# Patient Record
Sex: Female | Born: 1950
Health system: Southern US, Community
[De-identification: ages and names within clinical notes are randomized; demographics above are authoritative.]

## PROBLEM LIST (undated history)

## (undated) DIAGNOSIS — F32A Depression, unspecified: Secondary | ICD-10-CM

## (undated) DIAGNOSIS — R7303 Prediabetes: Secondary | ICD-10-CM

## (undated) DIAGNOSIS — R011 Cardiac murmur, unspecified: Secondary | ICD-10-CM

## (undated) DIAGNOSIS — N2889 Other specified disorders of kidney and ureter: Secondary | ICD-10-CM

## (undated) DIAGNOSIS — M48061 Spinal stenosis, lumbar region without neurogenic claudication: Secondary | ICD-10-CM

## (undated) DIAGNOSIS — Z8489 Family history of other specified conditions: Secondary | ICD-10-CM

## (undated) DIAGNOSIS — Z9989 Dependence on other enabling machines and devices: Secondary | ICD-10-CM

## (undated) DIAGNOSIS — C801 Malignant (primary) neoplasm, unspecified: Secondary | ICD-10-CM

## (undated) DIAGNOSIS — M1990 Unspecified osteoarthritis, unspecified site: Secondary | ICD-10-CM

## (undated) DIAGNOSIS — G4733 Obstructive sleep apnea (adult) (pediatric): Secondary | ICD-10-CM

## (undated) DIAGNOSIS — I10 Essential (primary) hypertension: Secondary | ICD-10-CM

## (undated) DIAGNOSIS — G4730 Sleep apnea, unspecified: Secondary | ICD-10-CM

## (undated) DIAGNOSIS — H269 Unspecified cataract: Secondary | ICD-10-CM

## (undated) DIAGNOSIS — G514 Facial myokymia: Secondary | ICD-10-CM

## (undated) DIAGNOSIS — E785 Hyperlipidemia, unspecified: Secondary | ICD-10-CM

## (undated) DIAGNOSIS — E039 Hypothyroidism, unspecified: Secondary | ICD-10-CM

## (undated) DIAGNOSIS — F329 Major depressive disorder, single episode, unspecified: Secondary | ICD-10-CM

## (undated) DIAGNOSIS — D649 Anemia, unspecified: Secondary | ICD-10-CM

## (undated) HISTORY — DX: Obstructive sleep apnea (adult) (pediatric): G47.33

## (undated) HISTORY — DX: Major depressive disorder, single episode, unspecified: F32.9

## (undated) HISTORY — PX: GASTRIC BYPASS: SHX52

## (undated) HISTORY — PX: TUBAL LIGATION: SHX77

## (undated) HISTORY — DX: Dependence on other enabling machines and devices: Z99.89

## (undated) HISTORY — DX: Sleep apnea, unspecified: G47.30

## (undated) HISTORY — DX: Hyperlipidemia, unspecified: E78.5

## (undated) HISTORY — PX: TONSILLECTOMY: SUR1361

## (undated) HISTORY — PX: HERNIA REPAIR: SHX51

## (undated) HISTORY — PX: UVULECTOMY: SHX2631

## (undated) HISTORY — DX: Cardiac murmur, unspecified: R01.1

## (undated) HISTORY — PX: JOINT REPLACEMENT: SHX530

## (undated) HISTORY — DX: Depression, unspecified: F32.A

## (undated) HISTORY — DX: Unspecified cataract: H26.9

## (undated) HISTORY — DX: Spinal stenosis, lumbar region without neurogenic claudication: M48.061

## (undated) HISTORY — DX: Hypothyroidism, unspecified: E03.9

## (undated) HISTORY — PX: KNEE ARTHROSCOPY: SUR90

## (undated) HISTORY — DX: Essential (primary) hypertension: I10

## (undated) HISTORY — PX: SPINE SURGERY: SHX786

## (undated) HISTORY — DX: Unspecified osteoarthritis, unspecified site: M19.90

---

## 2000-12-17 ENCOUNTER — Encounter: Admission: RE | Admit: 2000-12-17 | Discharge: 2000-12-17 | Payer: Self-pay | Admitting: Family Medicine

## 2000-12-17 ENCOUNTER — Encounter: Payer: Self-pay | Admitting: Family Medicine

## 2000-12-25 ENCOUNTER — Encounter: Admission: RE | Admit: 2000-12-25 | Discharge: 2000-12-25 | Payer: Self-pay | Admitting: Family Medicine

## 2000-12-25 ENCOUNTER — Encounter: Payer: Self-pay | Admitting: Family Medicine

## 2001-01-07 ENCOUNTER — Encounter: Payer: Self-pay | Admitting: Family Medicine

## 2001-01-07 ENCOUNTER — Encounter: Admission: RE | Admit: 2001-01-07 | Discharge: 2001-01-07 | Payer: Self-pay | Admitting: Family Medicine

## 2001-12-16 ENCOUNTER — Other Ambulatory Visit: Admission: RE | Admit: 2001-12-16 | Discharge: 2001-12-16 | Payer: Self-pay | Admitting: Family Medicine

## 2001-12-30 ENCOUNTER — Encounter: Payer: Self-pay | Admitting: Family Medicine

## 2001-12-30 ENCOUNTER — Ambulatory Visit (HOSPITAL_COMMUNITY): Admission: RE | Admit: 2001-12-30 | Discharge: 2001-12-30 | Payer: Self-pay | Admitting: Family Medicine

## 2002-02-18 ENCOUNTER — Ambulatory Visit (HOSPITAL_COMMUNITY): Admission: RE | Admit: 2002-02-18 | Discharge: 2002-02-18 | Payer: Self-pay | Admitting: Gastroenterology

## 2002-04-03 ENCOUNTER — Encounter: Payer: Self-pay | Admitting: Family Medicine

## 2002-04-03 ENCOUNTER — Encounter: Admission: RE | Admit: 2002-04-03 | Discharge: 2002-04-03 | Payer: Self-pay | Admitting: Family Medicine

## 2002-05-03 ENCOUNTER — Encounter: Payer: Self-pay | Admitting: Neurosurgery

## 2002-05-03 ENCOUNTER — Encounter: Admission: RE | Admit: 2002-05-03 | Discharge: 2002-05-03 | Payer: Self-pay | Admitting: Neurosurgery

## 2002-05-31 ENCOUNTER — Encounter: Payer: Self-pay | Admitting: Neurosurgery

## 2002-06-03 ENCOUNTER — Encounter: Payer: Self-pay | Admitting: Neurosurgery

## 2002-06-03 ENCOUNTER — Inpatient Hospital Stay (HOSPITAL_COMMUNITY): Admission: RE | Admit: 2002-06-03 | Discharge: 2002-06-04 | Payer: Self-pay | Admitting: Neurosurgery

## 2002-07-15 ENCOUNTER — Encounter: Payer: Self-pay | Admitting: Neurosurgery

## 2002-07-15 ENCOUNTER — Encounter: Admission: RE | Admit: 2002-07-15 | Discharge: 2002-07-15 | Payer: Self-pay | Admitting: Neurosurgery

## 2002-08-26 ENCOUNTER — Encounter: Payer: Self-pay | Admitting: Neurosurgery

## 2002-08-26 ENCOUNTER — Encounter: Admission: RE | Admit: 2002-08-26 | Discharge: 2002-08-26 | Payer: Self-pay | Admitting: Neurosurgery

## 2002-12-29 ENCOUNTER — Other Ambulatory Visit: Admission: RE | Admit: 2002-12-29 | Discharge: 2002-12-29 | Payer: Self-pay | Admitting: Family Medicine

## 2003-02-17 ENCOUNTER — Encounter: Payer: Self-pay | Admitting: Neurosurgery

## 2003-02-17 ENCOUNTER — Encounter: Admission: RE | Admit: 2003-02-17 | Discharge: 2003-02-17 | Payer: Self-pay | Admitting: Neurosurgery

## 2004-02-24 ENCOUNTER — Other Ambulatory Visit: Admission: RE | Admit: 2004-02-24 | Discharge: 2004-02-24 | Payer: Self-pay | Admitting: *Deleted

## 2004-03-18 ENCOUNTER — Ambulatory Visit (HOSPITAL_BASED_OUTPATIENT_CLINIC_OR_DEPARTMENT_OTHER): Admission: RE | Admit: 2004-03-18 | Discharge: 2004-03-18 | Payer: Self-pay | Admitting: Family Medicine

## 2004-05-16 ENCOUNTER — Ambulatory Visit (HOSPITAL_BASED_OUTPATIENT_CLINIC_OR_DEPARTMENT_OTHER): Admission: RE | Admit: 2004-05-16 | Discharge: 2004-05-16 | Payer: Self-pay | Admitting: Family Medicine

## 2004-07-02 ENCOUNTER — Ambulatory Visit (HOSPITAL_COMMUNITY): Admission: RE | Admit: 2004-07-02 | Discharge: 2004-07-02 | Payer: Self-pay | Admitting: Family Medicine

## 2005-05-06 ENCOUNTER — Other Ambulatory Visit: Admission: RE | Admit: 2005-05-06 | Discharge: 2005-05-06 | Payer: Self-pay | Admitting: Family Medicine

## 2006-01-16 ENCOUNTER — Encounter: Admission: RE | Admit: 2006-01-16 | Discharge: 2006-04-16 | Payer: Self-pay | Admitting: *Deleted

## 2006-01-23 ENCOUNTER — Ambulatory Visit (HOSPITAL_COMMUNITY): Admission: RE | Admit: 2006-01-23 | Discharge: 2006-01-23 | Payer: Self-pay | Admitting: Surgery

## 2006-01-24 ENCOUNTER — Ambulatory Visit (HOSPITAL_COMMUNITY): Admission: RE | Admit: 2006-01-24 | Discharge: 2006-01-24 | Payer: Self-pay | Admitting: *Deleted

## 2006-03-24 ENCOUNTER — Ambulatory Visit (HOSPITAL_COMMUNITY): Admission: RE | Admit: 2006-03-24 | Discharge: 2006-03-25 | Payer: Self-pay | Admitting: *Deleted

## 2006-03-24 ENCOUNTER — Encounter (INDEPENDENT_AMBULATORY_CARE_PROVIDER_SITE_OTHER): Payer: Self-pay | Admitting: Specialist

## 2006-05-05 ENCOUNTER — Encounter: Admission: RE | Admit: 2006-05-05 | Discharge: 2006-08-03 | Payer: Self-pay | Admitting: *Deleted

## 2006-11-20 ENCOUNTER — Encounter: Admission: RE | Admit: 2006-11-20 | Discharge: 2006-11-20 | Payer: Self-pay | Admitting: Family Medicine

## 2008-12-19 ENCOUNTER — Encounter: Admission: RE | Admit: 2008-12-19 | Discharge: 2008-12-19 | Payer: Self-pay | Admitting: Family Medicine

## 2010-12-09 ENCOUNTER — Encounter: Payer: Self-pay | Admitting: Family Medicine

## 2010-12-17 DIAGNOSIS — M159 Polyosteoarthritis, unspecified: Secondary | ICD-10-CM | POA: Insufficient documentation

## 2010-12-17 DIAGNOSIS — E669 Obesity, unspecified: Secondary | ICD-10-CM | POA: Insufficient documentation

## 2010-12-20 ENCOUNTER — Other Ambulatory Visit: Payer: Self-pay | Admitting: Family Medicine

## 2010-12-20 DIAGNOSIS — Z1239 Encounter for other screening for malignant neoplasm of breast: Secondary | ICD-10-CM

## 2010-12-20 DIAGNOSIS — Z780 Asymptomatic menopausal state: Secondary | ICD-10-CM

## 2010-12-20 DIAGNOSIS — J309 Allergic rhinitis, unspecified: Secondary | ICD-10-CM | POA: Insufficient documentation

## 2010-12-20 DIAGNOSIS — G2581 Restless legs syndrome: Secondary | ICD-10-CM | POA: Insufficient documentation

## 2011-01-15 ENCOUNTER — Ambulatory Visit
Admission: RE | Admit: 2011-01-15 | Discharge: 2011-01-15 | Disposition: A | Discharge status: Home / Self Care | Payer: BC Managed Care – PPO | Source: Ambulatory Visit | Attending: Family Medicine | Admitting: Family Medicine

## 2011-01-15 DIAGNOSIS — Z780 Asymptomatic menopausal state: Secondary | ICD-10-CM

## 2011-01-15 DIAGNOSIS — Z1239 Encounter for other screening for malignant neoplasm of breast: Secondary | ICD-10-CM

## 2011-04-05 NOTE — Procedures (Signed)
[place]  Patient:    Veronica Finley, Veronica Finley Visit Number: [id number] MRN: [id number]          Service Type: Attending:  Anselmo Rod, M.D. Dictated by:   Anselmo Rod, M.D. Proc. Date: 02/18/02   CC:         Talmadge Coventry, M.D.   Procedure Report  DATE OF BIRTH:  1951-09-19  REFERRING PHYSICIAN:  Talmadge Coventry, M.D.  PROCEDURE PERFORMED:  Colonoscopy.  ENDOSCOPIST:  Anselmo Rod, M.D.  INSTRUMENT USED:  Olympus pediatric adjustable video colonoscope.  INDICATIONS FOR PROCEDURE:  Rectal bleeding in a 60 year old white female. Rule out colonic polyps, masses, hemorrhoids, etc.  PREPROCEDURE PREPARATION:  Informed consent was procured from the patient. The patient was fasted for eight hours prior to the procedure and prepped with a bottle of magnesium citrate and a gallon of NuLytely the night prior to the procedure.  PREPROCEDURE PHYSICAL:  The patient had stable vital signs.  Neck supple. Chest clear to auscultation.  S1, S2 regular.  Abdomen soft with normal bowel sounds.  DESCRIPTION OF PROCEDURE:  The patient was placed in the left lateral decubitus position and sedated with 60 mg of Demerol and 6 mg of Versed intravenously.  Once the patient was adequately sedated and maintained on low-flow oxygen and continuous cardiac monitoring, the Olympus video colonoscope was advanced from the rectum to the cecum with some difficulty because of a large amount of residual stool in the cecum and right colon. Multiple washes were done.  The procedure was complete up to the cecum.  The appendiceal orifice and the ileocecal valve were clearly visualized and photographed.  The patient had small internal hemorrhoids seen on retroflexion and small external hemorrhoids appreciated on anal inspection.  The patient tolerated the procedure well without complication.  IMPRESSION: 1. No masses or polyps seen. 2. Small nonbleeding  internal and external hemorrhoids. 3. Some residual stool in the colon.  Small lesions could have been missed.  RECOMMENDATIONS: 1. A high fiber diet has been recommended for the patient. 2. Anusol HC 2.5% suppositories have been prescribed, 1 p.r. q.h.s. #30. 3. Outpatient follow-up in the next two to three weeks.Dictated by:   Anselmo Rod, M.D. Attending:  Anselmo Rod, M.D. DD:  02/18/02 TD:  02/18/02 Job: [id number]

## 2011-04-05 NOTE — Op Note (Signed)
Veronica Finley, FORINASH NO.:  [id number]   MEDICAL RECORD NO.:  [id number]          PATIENT TYPE:  OIB   LOCATION:  0152                         FACILITY:  [facility]   PHYSICIAN:  Alfonse Ras, MD   DATE OF BIRTH:  26-Oct-1951   DATE OF PROCEDURE:  03/24/2006  DATE OF DISCHARGE:                                 OPERATIVE REPORT   PREOPERATIVE DIAGNOSIS:  Morbid obesity and small sliding hiatal hernia.   POSTOPERATIVE DIAGNOSIS:  Morbid obesity and small sliding hiatal hernia.   PROCEDURE:  1.  Laparoscopic adjustable gastric banding with a 10-cm EndoMed band.  2.  Truncal vagotomy.  3.  Hiatal hernia repair.   SURGEON:  Alfonse Ras, M.D.   ASSISTANT:  Thornton Park. Daphine Deutscher, M.D.   ANESTHESIA:  General.   DESCRIPTION OF PROCEDURE:  The patient was taken to the operating room and  placed in the supine position.  After adequate general anesthesia was  induced using an endotracheal tube, the abdomen was prepped and draped in  the normal sterile fashion.   Using an 11-mm Optiview trocar in the left upper quadrant, peritoneal access  was obtained under direct vision.  Pneumoperitoneum was obtained, and  additional 11 and 12-mm trocars were placed.  The left lateral segment of  the liver was retracted anteriorly with a Nathanson liver retractor.  The  angle of His was then sharply and bluntly dissected.   I then turned my attention to the right crus of the diaphragm, and the  gastrohepatic ligament was divided.  The crus in the retroesophageal space  was then bluntly dissected.  The posterior vagus nerve was easily  identified, clipped, divided, and excised and sent for pathologic  evaluation.  Then, the peritoneum over the anterior surface of the esophagus  was incised.  A small sliding-type hiatal hernia was identified.  The  anterior vagus was found at the 11 o'clock region.  The esophagus was easily  dissected off, clipped, divided, and resected.  The  anterior bilateral  cruses were approximated with a bite of esophagus as well with a 2-0  Ethibond suture.  This closed up the hiatus nicely.   Endoscopy was then performed with [place] red in the stomach after simethicone  bath.  There was a little bit of pooling with some black discoloration, but  the mucosa itself remained red.  This was aspirated.   A pars flaccida technique was utilized in the posterior gastric space in the  lesser sac, and a 10-cm band was placed into the abdomen and placed around  the upper stomach.  Plicating sutures of 2-0 Ethibond were then placed in a  serosal to serosal fashion starting from the left upper portion of the  cardia.  Three separate sutures were placed.  The band appeared in good  position.  The [facility] liver retractor was removed.  Pneumoperitoneum was  released.  All trocars were removed.  The port was then tacked to the  anterior abdominal fascia with interrupted 4-0 Prolene  sutures.  The skin incisions were closed with subcutaneous 2-0 Vicryl and  subcuticular 4-0 Monocryl.  Steri-Strips and sterile dressings were applied.   The patient tolerated the procedure well and went to the PACU in good  condition.      Alfonse Ras, MD  Electronically Signed     KRE/MEDQ  D:  03/24/2006  T:  03/25/2006  Job:  [id number]

## 2011-04-05 NOTE — Op Note (Signed)
Veronica Finley. [facility]  Patient:    Veronica Finley, Veronica Finley Visit Number: [id number] MRN: [id number]          Service Type: SUR Location: 3000 [id number] Attending Physician:  Gerald Dexter Dictated by:   Reinaldo Meeker, M.D. Proc. Date: 06/03/02 Admit Date:  06/03/2002 Discharge Date: 06/04/2002                             Operative Report  PREOPERATIVE DIAGNOSIS:  Spinal stenosis and spondylolisthesis L4-5.  POSTOPERATIVE DIAGNOSIS:  Spinal stenosis and spondylolisthesis L4-5.  OPERATION PERFORMED:  Bilateral L4-5 decompressive laminectomy followed by bilateral L4-5 microdiskectomy followed by posterior lumbar interbody fusion with bone bank plug and autologous interbody fusion followed by transfacet screws.  SECONDARY PROCEDURE:  Microdissection L4-5 disk and L5 nerve roots bilaterally.  Intraoperative EMG monitoring.  SURGEON:  Reinaldo Meeker, M.D.  ASSISTANT:  Donalee Citrin, Montez Hageman.  ANESTHESIA:  DESCRIPTION OF PROCEDURE:  After being placed in the prone position, the patients back was prepped and draped in the usual sterile fashion. Localizing x-ray was taken prior to incision to identify the appropriate level.  The midline incision was made above the spinous processes of inferior L3, L4 and L5.  Using Bovie cutting current, the incision was carried down to the spinous processes.  Subperiosteal dissection was then carried out bilaterally along the spinous processes and lamina and good exposure of the facet joint at L4-5 was carried out as well.  Self-retaining retractor was placed for exposure.  X-ray showed approach to the appropriate level.  Using Stille rongeurs, the spinous process and interspinous ligament of L4-5 were removed.  Generous laminotomy was then performed bilaterally by removing the inferior one half of the L4 lamina, the medial one third of the facet joint and the superior one half of the L5 lamina.  Residual bone and  ligamentum flavum were removed in a piecemeal fashion.  The decompression was taken out as wide as the level of the pedicle.  At this point nerve roots were followed out their foramina generously until they were well decompressed.  At this time starting on the patients right side, microdissection technique was used to identify the lateral aspect of the thecal sac and L5 nerve root.  Further coagulation was carried down to the floor of the canal to identify the L4-5 disk.  After coagulating the annulus, the annulus was incised with a 15 blade. Using pituitary rongeurs and curets, a very thorough disk space clean out was carried out while at the same time great care was taken to avoid injury to the neural elements and this was successfully done.  Similar diskectomy was carried out on the patients left side once again aggressively removing the disk without injuring the neural elements.  At this point sequentially large distractors were placed starting with a 9 and then going to a 10 mm distractor.  A posterior lumbar interbody fusion was then carried out first placing a plug on the patients left side and then on the right.  This was all done under fluoroscopic guidance and 10 x 20 mm plugs chosen.  Fluoroscopy showed the plugs to be in excellent position and the neural elements appeared to be unharmed.  Prior to placing the second interbody plug, autologous bone graft from the laminotomy was packed in the midline between the two bone bank grafts.  At this time _______ facet screws were placed first on the patients  right, then on the patients left.  Under fluoroscopic guidance in both the lateral and AP direction, guide wire with a drill over it was passed through the inferior facet of L4 just to the inferior superior facet of L5 and into the pedicle of L5.  This was brought under fluoroscopic guidance and found to be in excellent position in AP and lateral planes.  The drill was then removed  and the guide wire left in place and a 35 mm screw was then placed over the guide wire until it was in a secure position.  The guide wire was then removed.  A similar procedure was then carried out on the patients left side once again following the guide wire and drill in both AP and lateral planes until it was passed down through the pedicle into the proximal vertebral body.  Once again, a good placement was obtained and 35 mm screw was placed without difficulty.  Final fluoroscopy in the AP and lateral plane showed the screws to be in excellent position.  The wound was then irrigated copiously.  Any bleeding was controlled with bipolar coagulation and Gelfoam. The wound was then closed using interrupted Vicryl suture on the muscle, fascia, subcutaneous and subcuticular tissues and staples on the skin. Sterile dressing was then applied.  A Hemovac drain was then left in epidural space and secured to the skin as well.  A sterile dressing was then applied and the patient was extubated and taken to recovery room in stable condition. Dictated by:   Reinaldo Meeker, M.D. Attending Physician:  Gerald Dexter DD:  06/03/02 TD:  06/08/02 Job: [id number]

## 2011-10-08 DIAGNOSIS — E039 Hypothyroidism, unspecified: Secondary | ICD-10-CM | POA: Insufficient documentation

## 2011-10-08 DIAGNOSIS — E782 Mixed hyperlipidemia: Secondary | ICD-10-CM | POA: Insufficient documentation

## 2012-04-06 DIAGNOSIS — I10 Essential (primary) hypertension: Secondary | ICD-10-CM | POA: Insufficient documentation

## 2012-07-07 ENCOUNTER — Other Ambulatory Visit: Payer: Self-pay | Admitting: Family Medicine

## 2012-07-07 DIAGNOSIS — Z1231 Encounter for screening mammogram for malignant neoplasm of breast: Secondary | ICD-10-CM

## 2012-08-06 ENCOUNTER — Ambulatory Visit
Admission: RE | Admit: 2012-08-06 | Discharge: 2012-08-06 | Disposition: A | Discharge status: Home / Self Care | Payer: BC Managed Care – PPO | Source: Ambulatory Visit | Attending: Family Medicine | Admitting: Family Medicine

## 2012-08-06 DIAGNOSIS — Z1231 Encounter for screening mammogram for malignant neoplasm of breast: Secondary | ICD-10-CM

## 2013-07-08 DIAGNOSIS — M48061 Spinal stenosis, lumbar region without neurogenic claudication: Secondary | ICD-10-CM

## 2013-07-08 DIAGNOSIS — F339 Major depressive disorder, recurrent, unspecified: Secondary | ICD-10-CM | POA: Insufficient documentation

## 2013-07-08 DIAGNOSIS — F329 Major depressive disorder, single episode, unspecified: Secondary | ICD-10-CM | POA: Insufficient documentation

## 2013-07-08 HISTORY — DX: Spinal stenosis, lumbar region without neurogenic claudication: M48.061

## 2014-01-10 DIAGNOSIS — Z9884 Bariatric surgery status: Secondary | ICD-10-CM | POA: Insufficient documentation

## 2015-01-11 ENCOUNTER — Other Ambulatory Visit: Payer: Self-pay | Admitting: Family Medicine

## 2015-01-11 DIAGNOSIS — Z1231 Encounter for screening mammogram for malignant neoplasm of breast: Secondary | ICD-10-CM

## 2015-02-06 ENCOUNTER — Ambulatory Visit
Admission: RE | Admit: 2015-02-06 | Discharge: 2015-02-06 | Disposition: A | Discharge status: Home / Self Care | Payer: BC Managed Care – PPO | Source: Ambulatory Visit | Attending: Family Medicine | Admitting: Family Medicine

## 2015-02-06 DIAGNOSIS — Z1231 Encounter for screening mammogram for malignant neoplasm of breast: Secondary | ICD-10-CM

## 2016-07-15 DIAGNOSIS — R61 Generalized hyperhidrosis: Secondary | ICD-10-CM | POA: Insufficient documentation

## 2016-07-17 ENCOUNTER — Other Ambulatory Visit: Payer: Self-pay | Admitting: Family Medicine

## 2016-07-17 DIAGNOSIS — Z1231 Encounter for screening mammogram for malignant neoplasm of breast: Secondary | ICD-10-CM

## 2016-07-25 ENCOUNTER — Ambulatory Visit
Admission: RE | Admit: 2016-07-25 | Discharge: 2016-07-25 | Disposition: A | Discharge status: Home / Self Care | Payer: BC Managed Care – PPO | Source: Ambulatory Visit | Attending: Family Medicine | Admitting: Family Medicine

## 2016-07-25 DIAGNOSIS — Z1231 Encounter for screening mammogram for malignant neoplasm of breast: Secondary | ICD-10-CM

## 2017-10-20 ENCOUNTER — Encounter: Payer: Self-pay | Admitting: *Deleted

## 2017-10-20 ENCOUNTER — Ambulatory Visit: Payer: BC Managed Care – PPO | Admitting: Family Medicine

## 2017-10-20 ENCOUNTER — Encounter: Payer: Self-pay | Admitting: Family Medicine

## 2017-10-20 VITALS — BP 146/78 | HR 88 | Temp 98.7°F | Resp 95 | Ht 67.0 in | Wt 241.5 lb

## 2017-10-20 DIAGNOSIS — Z0001 Encounter for general adult medical examination with abnormal findings: Secondary | ICD-10-CM | POA: Diagnosis not present

## 2017-10-20 DIAGNOSIS — M150 Primary generalized (osteo)arthritis: Secondary | ICD-10-CM | POA: Diagnosis not present

## 2017-10-20 DIAGNOSIS — E039 Hypothyroidism, unspecified: Secondary | ICD-10-CM | POA: Diagnosis not present

## 2017-10-20 DIAGNOSIS — M8949 Other hypertrophic osteoarthropathy, multiple sites: Secondary | ICD-10-CM

## 2017-10-20 DIAGNOSIS — F341 Dysthymic disorder: Secondary | ICD-10-CM | POA: Diagnosis not present

## 2017-10-20 DIAGNOSIS — M159 Polyosteoarthritis, unspecified: Secondary | ICD-10-CM

## 2017-10-20 DIAGNOSIS — G2581 Restless legs syndrome: Secondary | ICD-10-CM | POA: Diagnosis not present

## 2017-10-20 DIAGNOSIS — Z0000 Encounter for general adult medical examination without abnormal findings: Secondary | ICD-10-CM

## 2017-10-20 DIAGNOSIS — Z1239 Encounter for other screening for malignant neoplasm of breast: Secondary | ICD-10-CM

## 2017-10-20 DIAGNOSIS — F329 Major depressive disorder, single episode, unspecified: Secondary | ICD-10-CM

## 2017-10-20 DIAGNOSIS — Z23 Encounter for immunization: Secondary | ICD-10-CM | POA: Diagnosis not present

## 2017-10-20 DIAGNOSIS — I10 Essential (primary) hypertension: Secondary | ICD-10-CM

## 2017-10-20 DIAGNOSIS — Z1231 Encounter for screening mammogram for malignant neoplasm of breast: Secondary | ICD-10-CM

## 2017-10-20 DIAGNOSIS — E782 Mixed hyperlipidemia: Secondary | ICD-10-CM

## 2017-10-20 LAB — LIPID PANEL
Cholesterol: 136 mg/dL (ref 0–200)
HDL: 47.1 mg/dL (ref >39.00)
LDL Cholesterol: 64 mg/dL (ref 0–99)
NonHDL: 88.65
Total CHOL/HDL Ratio: 3
Triglycerides: 124 mg/dL (ref 0.0–149.0)
VLDL: 24.8 mg/dL (ref 0.0–40.0)

## 2017-10-20 LAB — CBC WITH DIFFERENTIAL/PLATELET
Basophils Absolute: 0.1 10*3/uL (ref 0.0–0.1)
Basophils Relative: 0.8 % (ref 0.0–3.0)
Eosinophils Absolute: 0.2 10*3/uL (ref 0.0–0.7)
Eosinophils Relative: 3.1 % (ref 0.0–5.0)
HCT: 45.6 % (ref 36.0–46.0)
Hemoglobin: 15.2 g/dL — ABNORMAL HIGH (ref 12.0–15.0)
Lymphocytes Relative: 28.3 % (ref 12.0–46.0)
Lymphs Abs: 2.2 10*3/uL (ref 0.7–4.0)
MCHC: 33.4 g/dL (ref 30.0–36.0)
MCV: 91 fl (ref 78.0–100.0)
Monocytes Absolute: 0.5 10*3/uL (ref 0.1–1.0)
Monocytes Relative: 6.9 % (ref 3.0–12.0)
Neutro Abs: 4.8 10*3/uL (ref 1.4–7.7)
Neutrophils Relative %: 60.9 % (ref 43.0–77.0)
Platelets: 308 10*3/uL (ref 150.0–400.0)
RBC: 5.01 Mil/uL (ref 3.87–5.11)
RDW: 13.2 % (ref 11.5–15.5)
WBC: 7.9 10*3/uL (ref 4.0–10.5)

## 2017-10-20 LAB — COMPREHENSIVE METABOLIC PANEL
ALT: 18 U/L (ref 0–35)
AST: 18 U/L (ref 0–37)
Albumin: 4.7 g/dL (ref 3.5–5.2)
Alkaline Phosphatase: 91 U/L (ref 39–117)
BUN: 12 mg/dL (ref 6–23)
CO2: 29 mEq/L (ref 19–32)
Calcium: 10.1 mg/dL (ref 8.4–10.5)
Chloride: 104 mEq/L (ref 96–112)
Creatinine, Ser: 0.96 mg/dL (ref 0.40–1.20)
GFR: 62.09 mL/min (ref >60.00)
Glucose, Bld: 106 mg/dL — ABNORMAL HIGH (ref 70–99)
Potassium: 4.2 mEq/L (ref 3.5–5.1)
Sodium: 142 mEq/L (ref 135–145)
Total Bilirubin: 0.7 mg/dL (ref 0.2–1.2)
Total Protein: 7.2 g/dL (ref 6.0–8.3)

## 2017-10-20 LAB — TSH: TSH: 0.81 u[IU]/mL (ref 0.35–4.50)

## 2017-10-20 MED ORDER — LOSARTAN POTASSIUM-HCTZ 100-25 MG PO TABS: 1.0000 | ORAL_TABLET | Freq: Every day | ORAL | 3 refills | Status: DC

## 2017-10-20 MED ORDER — ROPINIROLE HCL 3 MG PO TABS: 3.0000 mg | ORAL_TABLET | Freq: Every day | ORAL | 3 refills | Status: DC

## 2017-10-20 MED ORDER — FLUOXETINE HCL 20 MG PO CAPS: 20.0000 mg | ORAL_CAPSULE | Freq: Every day | ORAL | 3 refills | Status: DC

## 2017-10-20 MED ORDER — ROSUVASTATIN CALCIUM 10 MG PO TABS: 10.0000 mg | ORAL_TABLET | Freq: Every day | ORAL | 3 refills | Status: DC

## 2017-10-20 MED ORDER — LEVOTHYROXINE SODIUM 175 MCG PO TABS: 175.0000 ug | ORAL_TABLET | Freq: Every day | ORAL | 3 refills | Status: DC

## 2017-10-20 MED ORDER — DILTIAZEM HCL ER COATED BEADS 240 MG PO CP24: 240.0000 mg | ORAL_CAPSULE | Freq: Every day | ORAL | 3 refills | Status: DC

## 2017-10-20 MED ORDER — MELOXICAM 15 MG PO TABS: 7.5000 mg | ORAL_TABLET | Freq: Every day | ORAL | 3 refills | Status: DC

## 2017-10-20 NOTE — Patient Instructions (Addendum)
It was a pleasure meeting you! Thank you for choosing [place] to meet your healthcare needs! I truly look forward to working with you.  Please schedule a follow up appointment in 3 months for hypertension  Please do these things to maintain good health!   Exercise at least 30-45 minutes a day,  4-5 days a week.   Eat a low-fat diet with lots of fruits and vegetables, up to 7-9 servings per day.  Drink plenty of water daily. Try to drink 8 8oz glasses per day.  Seatbelts can save your life. Always wear your seatbelt.  Place Smoke Detectors on every level of your home and check batteries every year.  Schedule an appointment with an eye doctor for an eye exam every 1-2 years  Safe sex - use condoms to protect yourself from STDs if you could be exposed to these types of infections. Use birth control if you do not want to become pregnant and are sexually active.  Avoid heavy alcohol use. If you drink, keep it to less than 2 drinks/day and not every day.  [place].  Choose someone you trust that could speak for you if you became unable to speak for yourself.  Depression is common in our stressful world.If you're feeling down or losing interest in things you normally enjoy, please come in for a visit.  If anyone is threatening or hurting you, please get help. Physical or Emotional Violence is never OK.

## 2017-10-20 NOTE — Progress Notes (Signed)
Subjective  Chief Complaint  Patient presents with  . Annual Exam    BC/BS    HPI: Veronica Finley is a 66 y.o. female who presents to [place] at [facility] today for a Female Wellness Visit. She also has the concerns and/or needs as listed above in the chief complaint. These will be addressed in addition to the Health Maintenance Visit.   Wellness Visit: annual visit with health maintenance review and exam with Pap   Doing well since June (our last visit). Hasn't been exercising regularly but will restart with friends in January. Happy; keeps active sewing and with friends. Due for flu shot and mammogram.  Lifestyle: Body mass index is 37.82 kg/m. Wt Readings from Last 3 Encounters:  10/20/17 241 lb 8 oz (109.5 kg)   Diet: low sodium Exercise: intermittently, cardiovascular workout on exercise equipment and walking  Chronic disease management visit and/or acute problem visit:  Hypertension f/u: Control is fair . Pt reports she is doing well. taking medications as instructed, no medication side effects noted, no TIAs, no chest pain on exertion, no dyspnea on exertion, no swelling of ankles.  She denies adverse effects from his BP medications. Compliance with medication is good.   Hypothyroidism follow up: Current symptoms: none . Patient denies change in energy level, diarrhea, heat / cold intolerance, nervousness, palpitations and weight changes. Symptoms have been well-controlled. Takes meds daily. Due for tsh check. Has been controlled.   RLS and depression f/u: on meds  And reports both are well controlled. No concerns with mood; "I'm Happy!". No AEs from meds. Needs requip or will have RLS sxs for several hours at night.   Hyperlipidemia f/u: on crestor w/o AEs. Due for recheck. Diet is fair; has trouble over holidays. No sxs CAD. No myalgias or cp  OA: using mobic for knee and hip pain. Helps. No red swollen joints or limitations due to pain.     Patient Active Problem List   Diagnosis Date Noted  . Hx of laparoscopic gastric banding 01/10/2014    Priority: High  . Lumbar spinal stenosis 07/08/2013    Priority: High  . Major depression, chronic 07/08/2013    Priority: High  . Benign essential hypertension 04/06/2012    Priority: High  . Acquired hypothyroidism 10/08/2011    Priority: High  . Mixed hyperlipidemia 10/08/2011    Priority: High  . Obesity (BMI 30-39.9) 12/17/2010    Priority: High  . Restless legs syndrome (RLS) 12/20/2010    Priority: Medium  . Osteoarthritis, multiple sites 12/17/2010    Priority: Medium  . Generalized hyperhidrosis 07/15/2016    Priority: Low  . AR (allergic rhinitis) 12/20/2010    Priority: Low   Health Maintenance  Topic Date Due  . HIV Screening  04/19/1966  . INFLUENZA VACCINE  06/18/2017  . MAMMOGRAM  07/25/2017  . TETANUS/TDAP  11/18/2017  . PAP SMEAR  07/03/2018  . COLONOSCOPY  03/03/2025  . Hepatitis C Screening  Completed   Immunization History  Administered Date(s) Administered  . Influenza Inj Mdck Quad Pf 12/15/2014, 01/15/2016  . Influenza, Seasonal, Injecte, Preservative Fre 11/19/2007  . Influenza-Unspecified 10/08/2011  . PPD Test 11/19/2007  . Tdap 11/19/2007  . Zoster 07/11/2014   We updated and reviewed the patient's past history in detail and it is documented below. Allergies: Patient is allergic to codeine. Past Medical History Patient  has a past medical history of Arthritis, Depression, Hyperlipidemia, Hypertension, Hypothyroidism, and Lumbar spinal stenosis (07/08/2013). Past  Surgical History Patient  has a past surgical history that includes Knee arthroscopy; Tubal ligation; Gastric bypass; and Spine surgery. Social History   Socioeconomic History  . Marital status: Married    Spouse name: None  . Number of children: None  . Years of education: None  . Highest education level: None  Social Needs  . Financial resource strain: None  .  Food insecurity - worry: None  . Food insecurity - inability: None  . Transportation needs - medical: None  . Transportation needs - non-medical: None  Occupational History  . None  Tobacco Use  . Smoking status: Never Smoker  . Smokeless tobacco: Never Used  Substance and Sexual Activity  . Alcohol use: None    Comment: rarely  . Drug use: No  . Sexual activity: No  Other Topics Concern  . None  Social History Narrative  . None   Family History  Problem Relation Age of Onset  . Arthritis Mother   . Atrial fibrillation Mother   . Hyperlipidemia Mother   . Hypertension Mother   . Leukemia Father   . Heart disease Father   . Arthritis Sister   . Arthritis Brother   . Diabetes Brother   . Hyperlipidemia Brother     Review of Systems: Constitutional: negative for fever or malaise Ophthalmic: negative for photophobia, double vision or loss of vision Cardiovascular: negative for chest pain, dyspnea on exertion, or new LE swelling Respiratory: negative for SOB or persistent cough Gastrointestinal: negative for abdominal pain, change in bowel habits or melena Genitourinary: negative for dysuria or gross hematuria, no abnormal uterine bleeding or disharge Musculoskeletal: negative for new gait disturbance or muscular weakness, positive knee and hip pain due to OA - managed with mobic well Integumentary: negative for new or persistent rashes, no breast lumps Neurological: negative for TIA or stroke symptoms Psychiatric: negative for SI or delusions Allergic/Immunologic: negative for hives  Patient Care Team    Relationship Specialty Notifications Start End  Leamon Arnt, MD PCP - General Family Medicine  10/16/17     Objective  Vitals: BP (!) 146/78 (BP Location: Left Arm, Patient Position: Sitting, Cuff Size: Normal)   Pulse 88   Temp 98.7 F (37.1 C) (Oral)   Resp (!) 95   Ht 5\' 7"  (1.702 m)   Wt 241 lb 8 oz (109.5 kg)   BMI 37.82 kg/m  General:  Well  developed, well nourished, no acute distress , central obesity Psych:  Alert and orientedx3,normal mood and affect HEENT:  Normocephalic, atraumatic, non-icteric sclera, PERRL, oropharynx is clear without mass or exudate, supple neck without adenopathy, mass or thyromegaly Cardiovascular:  Normal S1, S2, RRR without gallop, rub or murmur, nondisplaced PMI, no peripheral edema, +2 distal pulses Respiratory:  Good breath sounds bilaterally, CTAB with normal respiratory effort Gastrointestinal: normal bowel sounds, soft, non-tender, no noted masses. No HSM MSK: no deformities, contusions. Joints are without erythema or swelling. Spine and CVA region are nontender Skin:  Warm, moist, no rashes or suspicious lesions noted Neurologic:    Mental status is normal. CN 2-11 are normal. Gross motor and sensory exams are normal. Normal gait. No tremor Breast Exam: No mass, skin retraction or nipple discharge is appreciated in either breast. No axillary adenopathy. Fibrocystic changes are not noted   Assessment  1. Annual physical exam   2. Benign essential hypertension   3. Acquired hypothyroidism   4. Mixed hyperlipidemia   5. Restless legs syndrome (RLS)   6.  Major depression, chronic   7. Primary osteoarthritis involving multiple joints   8. Breast cancer screening      Plan  Female Wellness Visit:  Age appropriate Health Maintenance and Prevention measures were discussed with patient. Included topics are cancer screening recommendations, ways to keep healthy (see AVS) including dietary and exercise recommendations, regular eye and dental care, use of seat belts, and avoidance of moderate alcohol use and tobacco use. To schedule mammogram in January after holidays at breast center.  BMI: discussed patient's BMI and encouraged positive lifestyle modifications to help get to or maintain a target BMI. Restart exercise program at [employer]  HM needs and immunizations were addressed and ordered.  See below for orders. See HM and immunization section for updates. Flu shot today  Routine labs and screening tests ordered including cmp, cbc and lipids where appropriate.  Discussed recommendations regarding Vit D and calcium supplementation (see AVS)  Chronic disease f/u and/or acute problem visit: (deemed necessary to be done in addition to the wellness visit):  HTN f/u; fair control. Increase CCB to 240mg  daily. Continue ace/hctz combo and low sodium diet. Recheck 3 months.   Hypothyroidism f/u: well controlled by sxs. Check tsh and continue supplements.   Lipids f/u: recheck today fasting. On statin. Check lfts; has been well controlled.   RLS f/u: controlled on requip. Refilled.   Depression: in remission on maintenance prozac. Refilled.   OA: continue mobic daily for now; if worsens, rec xrays and consideration of injections.   Follow up: Return in about 3 months (around 01/18/2018) for follow up Hypertension. or as needed for any worsening symptoms or changes in condition.   Commons side effects, risks, benefits, and alternatives for medications and treatment plan prescribed today were discussed, and the patient expressed understanding of the given instructions. Patient is instructed to call or message via MyChart if he/she has any questions or concerns regarding our treatment plan. No barriers to understanding were identified. We discussed Red Flag symptoms and signs in detail. Patient expressed understanding regarding what to do in case of urgent or emergency type symptoms.   Medication list was reconciled, printed and provided to the patient in AVS. Patient instructions and summary information was reviewed with the patient as documented in the AVS. This note was prepared with assistance of Dragon voice recognition software. Occasional wrong-word or sound-a-like substitutions may have occurred due to the inherent limitations of voice recognition software  Orders Placed This  Encounter  Procedures  . MM Digital Screening  . CBC with Differential/Platelet  . Comprehensive metabolic panel  . Lipid panel  . HIV antibody  . TSH   Meds ordered this encounter  Medications  . diltiazem (CARTIA XT) 240 MG 24 hr capsule    Sig: Take 1 capsule (240 mg total) by mouth daily.    Dispense:  90 capsule    Refill:  3  . FLUoxetine (PROZAC) 20 MG capsule    Sig: Take 1 capsule (20 mg total) by mouth daily.    Dispense:  90 capsule    Refill:  3  . levothyroxine (SYNTHROID, LEVOTHROID) 175 MCG tablet    Sig: Take 1 tablet (175 mcg total) by mouth daily before breakfast.    Dispense:  90 tablet    Refill:  3  . losartan-hydrochlorothiazide (HYZAAR) 100-25 MG tablet    Sig: Take 1 tablet by mouth daily.    Dispense:  90 tablet    Refill:  3  . meloxicam (MOBIC) 15  MG tablet    Sig: Take 0.5-1 tablets (7.5-15 mg total) by mouth daily.    Dispense:  90 tablet    Refill:  3  . rOPINIRole (REQUIP) 3 MG tablet    Sig: Take 1 tablet (3 mg total) by mouth at bedtime.    Dispense:  90 tablet    Refill:  3  . rosuvastatin (CRESTOR) 10 MG tablet    Sig: Take 1 tablet (10 mg total) by mouth at bedtime.    Dispense:  90 tablet    Refill:  3

## 2017-10-20 NOTE — Addendum Note (Signed)
Addended by: Marian Sorrow on: 10/20/2017 10:02 AM   Modules accepted: Orders

## 2017-10-21 ENCOUNTER — Encounter: Payer: Self-pay | Admitting: Family Medicine

## 2017-10-21 LAB — HIV ANTIBODY (ROUTINE TESTING W REFLEX): HIV 1&2 Ab, 4th Generation: NONREACTIVE

## 2017-10-21 NOTE — Progress Notes (Signed)
Letter sent. Nl lab results

## 2017-10-23 NOTE — Progress Notes (Signed)
Letter printed, signed by Dr. Jonni Sanger and mailed to patient.

## 2017-11-28 ENCOUNTER — Ambulatory Visit
Admission: RE | Admit: 2017-11-28 | Discharge: 2017-11-28 | Disposition: A | Discharge status: Home / Self Care | Payer: BC Managed Care – PPO | Source: Ambulatory Visit | Attending: Family Medicine | Admitting: Family Medicine

## 2017-11-28 DIAGNOSIS — Z1239 Encounter for other screening for malignant neoplasm of breast: Secondary | ICD-10-CM

## 2018-01-19 ENCOUNTER — Ambulatory Visit: Payer: BC Managed Care – PPO | Admitting: Family Medicine

## 2018-01-20 ENCOUNTER — Other Ambulatory Visit: Payer: Self-pay

## 2018-01-20 ENCOUNTER — Ambulatory Visit: Payer: BC Managed Care – PPO | Admitting: Family Medicine

## 2018-01-20 ENCOUNTER — Encounter: Payer: Self-pay | Admitting: Family Medicine

## 2018-01-20 VITALS — BP 132/82 | HR 80 | Temp 98.7°F | Resp 16 | Ht 67.0 in | Wt 240.2 lb

## 2018-01-20 DIAGNOSIS — I10 Essential (primary) hypertension: Secondary | ICD-10-CM | POA: Diagnosis not present

## 2018-01-20 DIAGNOSIS — E669 Obesity, unspecified: Secondary | ICD-10-CM | POA: Diagnosis not present

## 2018-01-20 NOTE — Patient Instructions (Signed)
Please return in 6 months for blood pressure.  If you have any questions or concerns, please don't hesitate to send me a message via MyChart or call the office at [phone]. Thank you for visiting with [place] today! It's our pleasure caring for you.

## 2018-01-20 NOTE — Progress Notes (Signed)
Subjective  CC:  Chief Complaint  Patient presents with  . Follow-up    Blood pressure and thyroid recheck    HPI: Veronica Finley is a 67 y.o. female who presents to the office today to address the problems listed above in the chief complaint.  Hypertension f/u: Control is improved with increase in meds.. Pt reports she is doing well. taking medications as instructed, no medication side effects noted, no TIAs, no chest pain on exertion, no dyspnea on exertion, no swelling of ankles. She denies adverse effects from his BP medications. Compliance with medication is good.   BP Readings from Last 3 Encounters:  01/20/18 132/82  10/20/17 (!) 146/78   Wt Readings from Last 3 Encounters:  01/20/18 240 lb 3.2 oz (109 kg)  10/20/17 241 lb 8 oz (109.5 kg)    Lab Results  Component Value Date   CHOL 136 10/20/2017   Lab Results  Component Value Date   HDL 47.10 10/20/2017   Lab Results  Component Value Date   LDLCALC 64 10/20/2017   Lab Results  Component Value Date   TRIG 124.0 10/20/2017   Lab Results  Component Value Date   CHOLHDL 3 10/20/2017   No results found for: LDLDIRECT Lab Results  Component Value Date   CREATININE 0.96 10/20/2017   BUN 12 10/20/2017   NA 142 10/20/2017   K 4.2 10/20/2017   CL 104 10/20/2017   CO2 29 10/20/2017    The 10-year ASCVD risk score Mikey Bussing DC Jr., et al., 2013) is: 6.2%   Values used to calculate the score:     Age: 16 years     Sex: Female     Is Non-Hispanic African American: No     Diabetic: No     Tobacco smoker: No     Systolic Blood Pressure: 536 mmHg     Is BP treated: Yes     HDL Cholesterol: 47.1 mg/dL     Total Cholesterol: 136 mg/dL  I reviewed the patients updated PMH, FH, and SocHx.    Patient Active Problem List   Diagnosis Date Noted  . Hx of laparoscopic gastric banding 01/10/2014    Priority: High  . Lumbar spinal stenosis 07/08/2013    Priority: High  . Major depression, chronic 07/08/2013   Priority: High  . Benign essential hypertension 04/06/2012    Priority: High  . Acquired hypothyroidism 10/08/2011    Priority: High  . Mixed hyperlipidemia 10/08/2011    Priority: High  . Obesity (BMI 30-39.9) 12/17/2010    Priority: High  . Restless legs syndrome (RLS) 12/20/2010    Priority: Medium  . Osteoarthritis, multiple sites 12/17/2010    Priority: Medium  . Generalized hyperhidrosis 07/15/2016    Priority: Low  . AR (allergic rhinitis) 12/20/2010    Priority: Low    Allergies: Codeine  Social History: Patient  reports that  has never smoked. she has never used smokeless tobacco. She reports that she does not use drugs.  Current Meds  Medication Sig  . Calcium Carbonate (CAL-CO3S PO) Take by mouth.  . Cholecalciferol (VITAMIN D) 2000 units tablet Take by mouth.  . diltiazem (CARTIA XT) 240 MG 24 hr capsule Take 1 capsule (240 mg total) by mouth daily.  Marland Kitchen FLUoxetine (PROZAC) 20 MG capsule Take 1 capsule (20 mg total) by mouth daily.  Marland Kitchen GLUCOSAMINE HCL PO Take by mouth.  . levothyroxine (SYNTHROID, LEVOTHROID) 175 MCG tablet Take 1 tablet (175 mcg total) by mouth  daily before breakfast.  . losartan-hydrochlorothiazide (HYZAAR) 100-25 MG tablet Take 1 tablet by mouth daily.  . meloxicam (MOBIC) 15 MG tablet Take 0.5-1 tablets (7.5-15 mg total) by mouth daily.  Marland Kitchen rOPINIRole (REQUIP) 3 MG tablet Take 1 tablet (3 mg total) by mouth at bedtime.  . rosuvastatin (CRESTOR) 10 MG tablet Take 1 tablet (10 mg total) by mouth at bedtime.    Review of Systems: Cardiovascular: negative for chest pain, palpitations, leg swelling, orthopnea Respiratory: negative for SOB, wheezing or persistent cough Gastrointestinal: negative for abdominal pain Genitourinary: negative for dysuria or gross hematuria  Objective  Vitals: BP 132/82   Pulse 80   Temp 98.7 F (37.1 C) (Oral)   Resp 16   Ht 5\' 7"  (1.702 m)   Wt 240 lb 3.2 oz (109 kg)   SpO2 94%   BMI 37.62 kg/m  General: no  acute distress  Psych:  Alert and oriented, normal mood and affect HEENT:  Normocephalic, atraumatic, supple neck  Cardiovascular:  RRR without murmur. no edema Respiratory:  Good breath sounds bilaterally, CTAB with normal respiratory effort Skin:  Warm, no rashes Neurologic:   Mental status is normal  Assessment  1. Benign essential hypertension   2. Obesity (BMI 30-39.9)      Plan    Hypertension f/u: BP control is well controlled. Continue same meds. Recent labs are normal.   Obesity f/u: continue to work on diet. Education regarding management of these chronic disease states was given. Management strategies discussed on successive visits include dietary and exercise recommendations, goals of achieving and maintaining IBW, and lifestyle modifications aiming for adequate sleep and minimizing stressors.   Follow up: Return in about 6 months (around 07/23/2018) for follow up Hypertension.   Commons side effects, risks, benefits, and alternatives for medications and treatment plan prescribed today were discussed, and the patient expressed understanding of the given instructions. Patient is instructed to call or message via MyChart if he/she has any questions or concerns regarding our treatment plan. No barriers to understanding were identified. We discussed Red Flag symptoms and signs in detail. Patient expressed understanding regarding what to do in case of urgent or emergency type symptoms.   Medication list was reconciled, printed and provided to the patient in AVS. Patient instructions and summary information was reviewed with the patient as documented in the AVS. This note was prepared with assistance of Dragon voice recognition software. Occasional wrong-word or sound-a-like substitutions may have occurred due to the inherent limitations of voice recognition software  No orders of the defined types were placed in this encounter.  No orders of the defined types were placed in this  encounter.

## 2018-07-21 ENCOUNTER — Ambulatory Visit: Payer: Medicare Other | Admitting: Family Medicine

## 2018-07-21 ENCOUNTER — Encounter: Payer: Self-pay | Admitting: Family Medicine

## 2018-07-21 ENCOUNTER — Other Ambulatory Visit: Payer: Self-pay

## 2018-07-21 ENCOUNTER — Ambulatory Visit (INDEPENDENT_AMBULATORY_CARE_PROVIDER_SITE_OTHER): Payer: Medicare Other

## 2018-07-21 VITALS — BP 130/76 | HR 82 | Temp 98.7°F | Ht 67.0 in | Wt 237.6 lb

## 2018-07-21 DIAGNOSIS — M25561 Pain in right knee: Secondary | ICD-10-CM

## 2018-07-21 DIAGNOSIS — M25562 Pain in left knee: Secondary | ICD-10-CM

## 2018-07-21 DIAGNOSIS — Z23 Encounter for immunization: Secondary | ICD-10-CM

## 2018-07-21 DIAGNOSIS — G2581 Restless legs syndrome: Secondary | ICD-10-CM

## 2018-07-21 DIAGNOSIS — E782 Mixed hyperlipidemia: Secondary | ICD-10-CM

## 2018-07-21 DIAGNOSIS — G8929 Other chronic pain: Secondary | ICD-10-CM

## 2018-07-21 DIAGNOSIS — I10 Essential (primary) hypertension: Secondary | ICD-10-CM

## 2018-07-21 DIAGNOSIS — F329 Major depressive disorder, single episode, unspecified: Secondary | ICD-10-CM

## 2018-07-21 MED ORDER — PNEUMOCOCCAL 13-VAL CONJ VACC IM SUSP: 0.5000 mL | Freq: Once | INTRAMUSCULAR | 0 refills | Status: AC

## 2018-07-21 NOTE — Progress Notes (Signed)
Subjective  CC:  Chief Complaint  Patient presents with  . Hypertension    doing well, no complaints, ok for Flu and TDAP     HPI: Veronica Finley is a 67 y.o. female who presents to the office today to address the problems listed above in the chief complaint.  Hypertension f/u: Control is good . Pt reports she is doing well. taking medications as instructed, no medication side effects noted, no TIAs, no chest pain on exertion, no dyspnea on exertion, no swelling of ankles. She denies adverse effects from his BP medications. Compliance with medication is good.   RLS is controlled on meds  Knee pain: now daily and persistent; on mobic daily which does help. No effusions or warmth. No trauma. Haven't imaged knees yet. Never has had injections. No limping. Does negatively affect quality of life.   Mood f/u; remains well controlled on chronic prozac  HLD: due for labs next visit. Tolerating statin.  HM:: due prevnar, flu and tdap, and shingrix  Assessment  1. Benign essential hypertension   2. Major depression, chronic   3. Mixed hyperlipidemia   4. Restless legs syndrome (RLS)   5. Chronic pain of both knees   6. Need for Tdap vaccination   7. Need for influenza vaccination      Plan    Hypertension f/u: BP control is well controlled. This medical condition is well controlled. There are no signs of complications, medication side effects, or red flags. Patient is instructed to continue the current treatment plan without change in therapies or medications.   Hyperlipidemia f/u: at goal on statin. Due for f/u labs in 3 months. Nl lfts last checked  Depression in remission on prozac for maintenance. Stable.   RLS improved on meds. No changes today  Flu and tdap today; rx for prevnar; will give RX for shingrix at [facility] visit.   Knee pain:suspect OA; imaging ordered. Educated synvisc. Will order if xrays confirm mild OA Education regarding management of these chronic disease  states was given. Management strategies discussed on successive visits include dietary and exercise recommendations, goals of achieving and maintaining IBW, and lifestyle modifications aiming for adequate sleep and minimizing stressors.   Follow up: Return in about 3 months (around 10/20/2018) for complete physical.  Orders Placed This Encounter  Procedures  . XR Knee 1-2 Views Left  . XR Knee 1-2 Views Right  . Flu vaccine HIGH DOSE PF  . Tdap vaccine greater than or equal to 7yo IM   Meds ordered this encounter  Medications  . pneumococcal 13-valent conjugate vaccine (PREVNAR 13) SUSP injection    Sig: Inject 0.5 mLs into the muscle once for 1 dose.    Dispense:  0.5 mL    Refill:  0      BP Readings from Last 3 Encounters:  07/21/18 130/76  01/20/18 132/82  10/20/17 (!) 146/78   Wt Readings from Last 3 Encounters:  07/21/18 237 lb 9.6 oz (107.8 kg)  01/20/18 240 lb 3.2 oz (109 kg)  10/20/17 241 lb 8 oz (109.5 kg)    Lab Results  Component Value Date   CHOL 136 10/20/2017   Lab Results  Component Value Date   HDL 47.10 10/20/2017   Lab Results  Component Value Date   LDLCALC 64 10/20/2017   Lab Results  Component Value Date   TRIG 124.0 10/20/2017   Lab Results  Component Value Date   CHOLHDL 3 10/20/2017   No results found  for: LDLDIRECT Lab Results  Component Value Date   CREATININE 0.96 10/20/2017   BUN 12 10/20/2017   NA 142 10/20/2017   K 4.2 10/20/2017   CL 104 10/20/2017   CO2 29 10/20/2017    The 10-year ASCVD risk score Mikey Bussing DC Jr., et al., 2013) is: 6.7%   Values used to calculate the score:     Age: 10 years     Sex: Female     Is Non-Hispanic African American: No     Diabetic: No     Tobacco smoker: No     Systolic Blood Pressure: 144 mmHg     Is BP treated: Yes     HDL Cholesterol: 47.1 mg/dL     Total Cholesterol: 136 mg/dL  I reviewed the patients updated PMH, FH, and SocHx.    Patient Active Problem List   Diagnosis Date  Noted  . Hx of laparoscopic gastric banding+ truncal vagotomy 03/24/2006 01/10/2014    Priority: High  . Lumbar spinal stenosis 07/08/2013    Priority: High  . Major depression, chronic 07/08/2013    Priority: High  . Benign essential hypertension 04/06/2012    Priority: High  . Acquired hypothyroidism 10/08/2011    Priority: High  . Mixed hyperlipidemia 10/08/2011    Priority: High  . Obesity (BMI 30-39.9) 12/17/2010    Priority: High  . Restless legs syndrome (RLS) 12/20/2010    Priority: Medium  . Osteoarthritis, multiple sites 12/17/2010    Priority: Medium  . Generalized hyperhidrosis 07/15/2016    Priority: Low  . AR (allergic rhinitis) 12/20/2010    Priority: Low    Allergies: Codeine  Social History: Patient  reports that she has never smoked. She has never used smokeless tobacco. She reports that she does not use drugs.  Current Meds  Medication Sig  . Calcium Carbonate (CAL-CO3S PO) Take by mouth.  . Cholecalciferol (VITAMIN D) 2000 units tablet Take by mouth.  . diltiazem (CARTIA XT) 240 MG 24 hr capsule Take 1 capsule (240 mg total) by mouth daily.  Marland Kitchen FLUoxetine (PROZAC) 20 MG capsule Take 1 capsule (20 mg total) by mouth daily.  Marland Kitchen GLUCOSAMINE HCL PO Take by mouth.  . levothyroxine (SYNTHROID, LEVOTHROID) 175 MCG tablet Take 1 tablet (175 mcg total) by mouth daily before breakfast.  . losartan-hydrochlorothiazide (HYZAAR) 100-25 MG tablet Take 1 tablet by mouth daily.  . meloxicam (MOBIC) 15 MG tablet Take 0.5-1 tablets (7.5-15 mg total) by mouth daily.  Marland Kitchen rOPINIRole (REQUIP) 3 MG tablet Take 1 tablet (3 mg total) by mouth at bedtime.  . rosuvastatin (CRESTOR) 10 MG tablet Take 1 tablet (10 mg total) by mouth at bedtime.    Review of Systems: Cardiovascular: negative for chest pain, palpitations, leg swelling, orthopnea Respiratory: negative for SOB, wheezing or persistent cough Gastrointestinal: negative for abdominal pain Genitourinary: negative for dysuria  or gross hematuria  Objective  Vitals: BP 130/76   Pulse 82   Temp 98.7 F (37.1 C)   Ht 5\' 7"  (1.702 m)   Wt 237 lb 9.6 oz (107.8 kg)   SpO2 98%   BMI 37.21 kg/m  General: no acute distress  Psych:  Alert and oriented, normal mood and affect HEENT:  Normocephalic, atraumatic, supple neck  Cardiovascular:  RRR without murmur. no edema Respiratory:  Good breath sounds bilaterally, CTAB with normal respiratory effort Neurologic:   Mental status is normal  Commons side effects, risks, benefits, and alternatives for medications and treatment plan prescribed today were discussed,  and the patient expressed understanding of the given instructions. Patient is instructed to call or message via MyChart if he/she has any questions or concerns regarding our treatment plan. No barriers to understanding were identified. We discussed Red Flag symptoms and signs in detail. Patient expressed understanding regarding what to do in case of urgent or emergency type symptoms.   Medication list was reconciled, printed and provided to the patient in AVS. Patient instructions and summary information was reviewed with the patient as documented in the AVS. This note was prepared with assistance of Dragon voice recognition software. Occasional wrong-word or sound-a-like substitutions may have occurred due to the inherent limitations of voice recognition software

## 2018-07-21 NOTE — Patient Instructions (Addendum)
Please return in 3-4 months for your annual complete physical; please come fasting.  Please go to our [facility] office to get your xrays done. You can walk in M-F between 8am and 5pm. Tell them you are there for xrays ordered by me. They will send me the results, then I will let you know the results with instructions.   Address: [place], [place], [place]  (office sits at [address] at [address] intersection; from here, turn left onto [place] [address]), take to [address], turn right and go for a mile or so, office will be on left across form [employer] )  If you have any questions or concerns, please don't hesitate to send me a message via MyChart or call the office at [phone]. Thank you for visiting with [place] today! It's our pleasure caring for you.  Hylan G-F 20 intra-articular injection What is this medicine? HYLAN G-F 20 (HI lan G F 20) is used to treat osteoarthritis of the knee. It lubricates and cushions the joint, reducing pain in the knee. This medicine may be used for other purposes; ask your health care provider or pharmacist if you have questions. COMMON BRAND NAME(S): Synvisc, Synvisc-One What should I tell my health care provider before I take this medicine? They need to know if you have any of these conditions: -severe knee inflammation -skin conditions or sensitivity -skin or joint infection -venous stasis -an unusual or allergic reaction to hylan G-F 20, hyaluronan (sodium hyaluronate), eggs, other medicines, foods, dyes, or preservatives -pregnant or trying to get pregnant -breast-feeding How should I use this medicine? This medicine is for injection into the knee joint. It is given by a health care professional in a hospital or clinic setting. Talk to your pediatrician regarding the use of this medicine in children. This medicine is not approved for use in children. Overdosage: If you think  you have taken too much of this medicine contact a poison control center or emergency room at once. NOTE: This medicine is only for you. Do not share this medicine with others. What if I miss a dose? Keep appointments for follow-up doses as directed. For Synvisc, you will need weekly injections for 3 doses. It is important not to miss your dose. If you will receive Synvisc-One, then only 1 injection will be needed. Call your doctor or health care professional if you are unable to keep an appointment. What may interact with this medicine? Do not take this medicine with any of the following medications: -other injections for the joint like steroids or anesthetics -certain skin disinfectants like benzalkonium chloride This list may not describe all possible interactions. Give your health care provider a list of all the medicines, herbs, non-prescription drugs, or dietary supplements you use. Also tell them if you smoke, drink alcohol, or use illegal drugs. Some items may interact with your medicine. What should I watch for while using this medicine? Tell your doctor or healthcare professional if your symptoms do not start to get better or if they get worse. Your condition will be monitored carefully while you are receiving this medicine. Most persons get pain relief for up to 6 months after treatment. Avoid strenuous activities (high-impact sports, jogging) or major weight-bearing activities for 48 hours after the injection. What side effects may I notice from receiving this medicine? Side effects that you should report to your doctor or health care professional as soon as possible: -allergic reactions like  skin rash, itching or hives, swelling of the face, lips, or tongue -difficulty breathing -fever or chills -severe joint pain or swelling -unusual bleeding or bruising Side effects that usually do not require medical attention (report to your doctor or health care professional if they continue or  are bothersome): -dizziness -flushing -general ill feeling or flu-like symptoms -headache -minor joint pain or swelling -muscle pain or cramps -pain, redness, irritation or bruising at site of injection This list may not describe all possible side effects. Call your doctor for medical advice about side effects. You may report side effects to FDA at 1-800-FDA-1088. Where should I keep my medicine? This drug is given in a hospital or clinic and will not be stored at home. NOTE: This sheet is a summary. It may not cover all possible information. If you have questions about this medicine, talk to your doctor, pharmacist, or health care provider.  2018 Elsevier/Gold Standard (2015-12-07 11:48:41)

## 2018-07-28 ENCOUNTER — Other Ambulatory Visit: Payer: Self-pay

## 2018-07-28 ENCOUNTER — Ambulatory Visit: Payer: Medicare Other | Admitting: Family Medicine

## 2018-07-28 ENCOUNTER — Encounter: Payer: Self-pay | Admitting: Family Medicine

## 2018-07-28 VITALS — BP 122/70 | HR 80 | Ht 67.0 in | Wt 243.0 lb

## 2018-07-28 DIAGNOSIS — M170 Bilateral primary osteoarthritis of knee: Secondary | ICD-10-CM | POA: Diagnosis not present

## 2018-07-28 MED ORDER — TRIAMCINOLONE ACETONIDE 40 MG/ML IJ SUSP
40.0000 mg | Freq: Once | INTRAMUSCULAR | Status: AC
  Administered 2018-07-28: 40 mg via INTRA_ARTICULAR

## 2018-07-28 NOTE — Patient Instructions (Signed)
   If you have any questions or concerns, please don't hesitate to send me a message via MyChart or call the office at [phone]. Thank you for visiting with [place] today! It's our pleasure caring for you.  Knee Injection, Care After Refer to this sheet in the next few weeks. These instructions provide you with information about caring for yourself after your procedure. Your health care provider may also give you more specific instructions. Your treatment has been planned according to current medical practices, but problems sometimes occur. Call your health care provider if you have any problems or questions after your procedure. What can I expect after the procedure? After the procedure, it is common to have:  Soreness.  Warmth.  Swelling.  You may have more pain, swelling, and warmth than you did before the injection. This reaction may last for about one day. Follow these instructions at home: Bathing  If you were given a bandage (dressing), keep it dry until your health care provider says it can be removed. Ask your health care provider when you can start showering or taking a bath. Managing pain, stiffness, and swelling  If directed, apply ice to the injection area: ? Put ice in a plastic bag. ? Place a towel between your skin and the bag. ? Leave the ice on for 20 minutes, 2-3 times per day.  Do not apply heat to your knee.  Raise the injection area above the level of your heart while you are sitting or lying down. Activity  Avoid strenuous activities for as long as directed by your health care provider. Ask your health care provider when you can return to your normal activities. General instructions  Take medicines only as directed by your health care provider.  Do not take aspirin or other over-the-counter medicines unless your health care provider says you can.  Check your injection site every day for signs of infection. Watch for: ? Redness, swelling, or pain. ? Fluid,  blood, or pus.  Follow your health care provider's instructions about dressing changes and removal. Contact a health care provider if:  You have symptoms at your injection site that last longer than two days after your procedure.  You have redness, swelling, or pain in your injection area.  You have fluid, blood, or pus coming from your injection site.  You have warmth in your injection area.  You have a fever.  Your pain is not controlled with medicine. Get help right away if:  Your knee turns very red.  Your knee becomes very swollen.  Your knee pain is severe. This information is not intended to replace advice given to you by your health care provider. Make sure you discuss any questions you have with your health care provider. Document Released: 11/25/2014 Document Revised: 07/10/2016 Document Reviewed: 09/14/2014 Elsevier Interactive Patient Education  [employer].

## 2018-07-28 NOTE — Progress Notes (Signed)
Knee Arthrocentesis with Injection Procedure Note  Pre-operative Diagnosis: left knee severe OA  Post-operative Diagnosis: same  Indications: Symptom relief from osteoarthritis  Anesthesia: Lidocaine 1% without epinephrine without added sodium bicarbonate  Procedure Details   Verbal consent was obtained for the procedure. Universal time out taken.  The Knee joint was prepped with alcohol and an 25 1 1/2" gauge needle was inserted into the joint from the lateral approach.  Four ml 1% lidocaine and one ml of triamcinolone (KENALOG) 40mg /ml was then injected into the joint through the same needle. The needle was removed and the area cleansed and dressed.  Complications:  None; patient tolerated the procedure well.  Knee Arthrocentesis with Injection Procedure Note  Pre-operative Diagnosis: right knee severe OA  Post-operative Diagnosis: same  Indications: Symptom relief from osteoarthritis  Anesthesia: Lidocaine 1% without epinephrine without added sodium bicarbonate  Procedure Details   Verbal consent was obtained for the procedure. Universal time out taken.   The Knee joint was prepped with alcohol and an 25 gauge needle was inserted into the joint from the medial approach after failed attempts x 2 laterally. Four ml 1% lidocaine and one ml of triamcinolone (KENALOG) 40mg /ml was then injected into the joint through the same needle. The needle was removed and the area cleansed and dressed.  Complications:  None; patient tolerated the procedure well.

## 2018-08-10 ENCOUNTER — Encounter: Payer: Self-pay | Admitting: Family Medicine

## 2018-08-12 NOTE — Telephone Encounter (Signed)
Please set up for bilateral synvisc one injections (authorize through insurance), order meds and then call to schedule once we have them.

## 2018-08-20 NOTE — Telephone Encounter (Signed)
Spoke with Insurance, Patient has a 50$ copay per injection. I spoke with Patient and she agrees to go ahead and order. I have placed an order for Injections.   Doloris Hall,  LPN

## 2018-09-01 ENCOUNTER — Encounter: Payer: Self-pay | Admitting: Family Medicine

## 2018-09-01 ENCOUNTER — Other Ambulatory Visit: Payer: Self-pay

## 2018-09-01 ENCOUNTER — Ambulatory Visit: Payer: Medicare Other | Admitting: Family Medicine

## 2018-09-01 VITALS — BP 116/84 | HR 85 | Temp 98.5°F | Ht 67.0 in | Wt 240.0 lb

## 2018-09-01 DIAGNOSIS — M170 Bilateral primary osteoarthritis of knee: Secondary | ICD-10-CM

## 2018-09-01 MED ORDER — HYLAN G-F 20 48 MG/6ML IX SOSY: 48.0000 mg | PREFILLED_SYRINGE | Freq: Once | INTRA_ARTICULAR | 0 refills | Status: DC

## 2018-09-01 NOTE — Progress Notes (Signed)
Veronica Finley is here for a Synvisc injection.  She had bilateral steroid injections few weeks ago.  Pain has been significantly lessened with a steroid injection.  She had no complications.  She was having daily pain.  Procedure Note for Injection of Synvisc 16mg /injection (8mg /ml) Dx: osteoarthritis of knee(s) Indication: pain relief .Right Knee(s) injection(s) was/were performed as follows:  Initial universal "time out" was performed  Verbal consent was obtained regarding procedure, indications, risks and benefits   With the patient sitting on edge of bed, the knee(s) was/were cleansed with betadine and alcohol, anesthesia with ethyl chloride spray was done and using the medial approach, Synvisc 16mg  was injected to the knee(s). The flow was easy into the large joint space and the patient tolerated the procedure well. There were no complications. No bleeding. The patient left the exam room in good condition.

## 2018-09-01 NOTE — Patient Instructions (Signed)
Please return in dec for complete physical.  If you have any questions or concerns, please don't hesitate to send me a message via MyChart or call the office at [phone]. Thank you for visiting with [place] today! It's our pleasure caring for you.  Veronica Finley 20 intra-articular injection What is this medicine? Veronica Finley 20 (HI lan G F 20) is used to treat osteoarthritis of the knee. It lubricates and cushions the joint, reducing pain in the knee. This medicine may be used for other purposes; ask your health care provider or pharmacist if you have questions. COMMON BRAND NAME(S): Synvisc, Synvisc-One What should I tell my health care provider before I take this medicine? They need to know if you have any of these conditions: -severe knee inflammation -skin conditions or sensitivity -skin or joint infection -venous stasis -an unusual or allergic reaction to Veronica Finley 20, hyaluronan (sodium hyaluronate), eggs, other medicines, foods, dyes, or preservatives -pregnant or trying to get pregnant -breast-feeding How should I use this medicine? This medicine is for injection into the knee joint. It is given by a health care professional in a hospital or clinic setting. Talk to your pediatrician regarding the use of this medicine in children. This medicine is not approved for use in children. Overdosage: If you think you have taken too much of this medicine contact a poison control center or emergency room at once. NOTE: This medicine is only for you. Do not share this medicine with others. What if I miss a dose? Keep appointments for follow-up doses as directed. For Synvisc, you will need weekly injections for 3 doses. It is important not to miss your dose. If you will receive Synvisc-One, then only 1 injection will be needed. Call your doctor or health care professional if you are unable to keep an appointment. What may interact with this medicine? Do not take this medicine with any of the following  medications: -other injections for the joint like steroids or anesthetics -certain skin disinfectants like benzalkonium chloride This list may not describe all possible interactions. Give your health care provider a list of all the medicines, herbs, non-prescription drugs, or dietary supplements you use. Also tell them if you smoke, drink alcohol, or use illegal drugs. Some items may interact with your medicine. What should I watch for while using this medicine? Tell your doctor or healthcare professional if your symptoms do not start to get better or if they get worse. Your condition will be monitored carefully while you are receiving this medicine. Most persons get pain relief for up to 6 months after treatment. Avoid strenuous activities (high-impact sports, jogging) or major weight-bearing activities for 48 hours after the injection. What side effects may I notice from receiving this medicine? Side effects that you should report to your doctor or health care professional as soon as possible: -allergic reactions like skin rash, itching or hives, swelling of the face, lips, or tongue -difficulty breathing -fever or chills -severe joint pain or swelling -unusual bleeding or bruising Side effects that usually do not require medical attention (report to your doctor or health care professional if they continue or are bothersome): -dizziness -flushing -general ill feeling or flu-like symptoms -headache -minor joint pain or swelling -muscle pain or cramps -pain, redness, irritation or bruising at site of injection This list may not describe all possible side effects. Call your doctor for medical advice about side effects. You may report side effects to FDA at 1-800-FDA-1088. Where should I keep my medicine? This  drug is given in a hospital or clinic and will not be stored at home. NOTE: This sheet is a summary. It may not cover all possible information. If you have questions about this medicine,  talk to your doctor, pharmacist, or health care provider.  2018 Elsevier/Gold Standard (2015-12-07 11:48:41)

## 2018-09-02 NOTE — Progress Notes (Addendum)
Synvisc One  LOT: [id number]  EXP: 05/17/2021  [place] [phone]

## 2018-09-09 ENCOUNTER — Telehealth: Payer: Self-pay | Admitting: Family Medicine

## 2018-09-09 NOTE — Telephone Encounter (Signed)
Appt scheduled for 09/15/2018 at 3:30pm  Doloris Hall,  LPN

## 2018-09-09 NOTE — Telephone Encounter (Signed)
Received call from [facility], pt is wanting to schedule a left knee injection, please advise.

## 2018-09-15 ENCOUNTER — Ambulatory Visit (INDEPENDENT_AMBULATORY_CARE_PROVIDER_SITE_OTHER): Payer: Medicare Other | Admitting: Family Medicine

## 2018-09-15 ENCOUNTER — Encounter: Payer: Self-pay | Admitting: Family Medicine

## 2018-09-15 ENCOUNTER — Other Ambulatory Visit: Payer: Self-pay

## 2018-09-15 VITALS — BP 112/76 | HR 96 | Temp 98.1°F | Ht 67.0 in | Wt 241.6 lb

## 2018-09-15 DIAGNOSIS — M1712 Unilateral primary osteoarthritis, left knee: Secondary | ICD-10-CM

## 2018-09-15 NOTE — Progress Notes (Addendum)
Here for left synvisc injectoin; her first.  Had right injected 1-2 weeks ago; doing well.   Procedure Note for Injection of Synvisc 16mg /injection (8mg /ml) Dx: osteoarthritis of knee(s) Indication: pain relief .Left Knee(s) injection(s) was/were performed as follows:  Initial universal "time out" was performed  Verbal consent was obtained regarding procedure, indications, risks and benefits   With the patient sitting on edge of bed, the knee(s) was/were cleansed with betadine and alcohol, anesthesia with ethyl chloride spray was done and using the lateral approach, Synvisc-one 48 was injected to the knee(s). The flow was easy into the large joint space and the patient tolerated the procedure well. There were no complications. No bleeding. The patient left the exam room in good condition.     Has cpe scheduled for december

## 2018-09-15 NOTE — Progress Notes (Signed)
Synvisc   LOT: [id number]  EXP: 10/17/2020

## 2018-09-15 NOTE — Patient Instructions (Addendum)
See you in December for your physical.

## 2018-10-22 ENCOUNTER — Other Ambulatory Visit: Payer: Self-pay | Admitting: Emergency Medicine

## 2018-10-22 MED ORDER — ROPINIROLE HCL 3 MG PO TABS: 3.0000 mg | ORAL_TABLET | Freq: Every day | ORAL | 3 refills | Status: DC

## 2018-10-22 MED ORDER — FLUOXETINE HCL 20 MG PO CAPS: 20.0000 mg | ORAL_CAPSULE | Freq: Every day | ORAL | 3 refills | Status: DC

## 2018-10-22 MED ORDER — LEVOTHYROXINE SODIUM 175 MCG PO TABS: 175.0000 ug | ORAL_TABLET | Freq: Every day | ORAL | 3 refills | Status: DC

## 2018-10-22 MED ORDER — DILTIAZEM HCL ER COATED BEADS 240 MG PO CP24
240.0000 mg | ORAL_CAPSULE | Freq: Every day | ORAL | 3 refills | Status: DC
Start: 2018-10-22 — End: 2019-10-18

## 2018-10-26 ENCOUNTER — Encounter: Payer: Self-pay | Admitting: Family Medicine

## 2018-10-26 ENCOUNTER — Other Ambulatory Visit (HOSPITAL_COMMUNITY)
Admission: RE | Admit: 2018-10-26 | Discharge: 2018-10-26 | Disposition: A | Discharge status: Home / Self Care | Payer: Medicare Other | Source: Ambulatory Visit | Attending: Family Medicine | Admitting: Family Medicine

## 2018-10-26 ENCOUNTER — Ambulatory Visit (INDEPENDENT_AMBULATORY_CARE_PROVIDER_SITE_OTHER): Payer: Medicare Other | Admitting: Family Medicine

## 2018-10-26 ENCOUNTER — Other Ambulatory Visit: Payer: Self-pay

## 2018-10-26 VITALS — BP 112/76 | HR 78 | Temp 98.3°F | Ht 67.0 in | Wt 244.0 lb

## 2018-10-26 DIAGNOSIS — G2581 Restless legs syndrome: Secondary | ICD-10-CM | POA: Diagnosis not present

## 2018-10-26 DIAGNOSIS — I10 Essential (primary) hypertension: Secondary | ICD-10-CM | POA: Diagnosis not present

## 2018-10-26 DIAGNOSIS — E782 Mixed hyperlipidemia: Secondary | ICD-10-CM

## 2018-10-26 DIAGNOSIS — Z0000 Encounter for general adult medical examination without abnormal findings: Secondary | ICD-10-CM | POA: Diagnosis not present

## 2018-10-26 DIAGNOSIS — Z124 Encounter for screening for malignant neoplasm of cervix: Secondary | ICD-10-CM | POA: Diagnosis not present

## 2018-10-26 DIAGNOSIS — Z9989 Dependence on other enabling machines and devices: Secondary | ICD-10-CM

## 2018-10-26 DIAGNOSIS — F329 Major depressive disorder, single episode, unspecified: Secondary | ICD-10-CM

## 2018-10-26 DIAGNOSIS — E039 Hypothyroidism, unspecified: Secondary | ICD-10-CM

## 2018-10-26 DIAGNOSIS — E2839 Other primary ovarian failure: Secondary | ICD-10-CM | POA: Diagnosis not present

## 2018-10-26 DIAGNOSIS — G4733 Obstructive sleep apnea (adult) (pediatric): Secondary | ICD-10-CM | POA: Insufficient documentation

## 2018-10-26 HISTORY — DX: Dependence on other enabling machines and devices: Z99.89

## 2018-10-26 HISTORY — DX: Obstructive sleep apnea (adult) (pediatric): G47.33

## 2018-10-26 LAB — CBC WITH DIFFERENTIAL/PLATELET
Basophils Absolute: 0 10*3/uL (ref 0.0–0.1)
Basophils Relative: 0.5 % (ref 0.0–3.0)
Eosinophils Absolute: 0.2 10*3/uL (ref 0.0–0.7)
Eosinophils Relative: 2.7 % (ref 0.0–5.0)
HCT: 42.6 % (ref 36.0–46.0)
Hemoglobin: 14.4 g/dL (ref 12.0–15.0)
Lymphocytes Relative: 25 % (ref 12.0–46.0)
Lymphs Abs: 2.1 10*3/uL (ref 0.7–4.0)
MCHC: 33.8 g/dL (ref 30.0–36.0)
MCV: 89.9 fl (ref 78.0–100.0)
Monocytes Absolute: 0.6 10*3/uL (ref 0.1–1.0)
Monocytes Relative: 6.8 % (ref 3.0–12.0)
Neutro Abs: 5.3 10*3/uL (ref 1.4–7.7)
Neutrophils Relative %: 65 % (ref 43.0–77.0)
Platelets: 285 10*3/uL (ref 150.0–400.0)
RBC: 4.75 Mil/uL (ref 3.87–5.11)
RDW: 13.4 % (ref 11.5–15.5)
WBC: 8.2 10*3/uL (ref 4.0–10.5)

## 2018-10-26 LAB — COMPREHENSIVE METABOLIC PANEL
ALT: 19 U/L (ref 0–35)
AST: 22 U/L (ref 0–37)
Albumin: 4.2 g/dL (ref 3.5–5.2)
Alkaline Phosphatase: 92 U/L (ref 39–117)
BUN: 14 mg/dL (ref 6–23)
CO2: 23 mEq/L (ref 19–32)
Calcium: 9.6 mg/dL (ref 8.4–10.5)
Chloride: 105 mEq/L (ref 96–112)
Creatinine, Ser: 0.91 mg/dL (ref 0.40–1.20)
GFR: 65.84 mL/min (ref >60.00)
Glucose, Bld: 103 mg/dL — ABNORMAL HIGH (ref 70–99)
Potassium: 3.8 mEq/L (ref 3.5–5.1)
Sodium: 141 mEq/L (ref 135–145)
Total Bilirubin: 0.7 mg/dL (ref 0.2–1.2)
Total Protein: 7 g/dL (ref 6.0–8.3)

## 2018-10-26 LAB — LIPID PANEL
Cholesterol: 118 mg/dL (ref 0–200)
HDL: 49.9 mg/dL (ref >39.00)
LDL Cholesterol: 50 mg/dL (ref 0–99)
NonHDL: 68.34
Total CHOL/HDL Ratio: 2
Triglycerides: 90 mg/dL (ref 0.0–149.0)
VLDL: 18 mg/dL (ref 0.0–40.0)

## 2018-10-26 LAB — TSH: TSH: 0.99 u[IU]/mL (ref 0.35–4.50)

## 2018-10-26 MED ORDER — FLUOXETINE HCL 20 MG PO CAPS: 20.0000 mg | ORAL_CAPSULE | Freq: Every day | ORAL | 3 refills | Status: DC

## 2018-10-26 MED ORDER — ROPINIROLE HCL 3 MG PO TABS: 3.0000 mg | ORAL_TABLET | Freq: Every day | ORAL | 3 refills | Status: DC

## 2018-10-26 MED ORDER — LOSARTAN POTASSIUM-HCTZ 100-25 MG PO TABS
1.0000 | ORAL_TABLET | Freq: Every day | ORAL | 3 refills | Status: DC
Start: 2018-10-26 — End: 2019-10-18

## 2018-10-26 NOTE — Progress Notes (Signed)
Subjective  Chief Complaint  Patient presents with  . Annual Exam    doing well, wants to discuss CPAP supplies. Pap today, due for DEXA scan. Patient is fasting today   . Knee Pain    was better after synvisc but she states that after her trip they got worse.     HPI: Veronica Finley is a 67 y.o. female who presents to [place] [facility] today for a Female Wellness Visit. She also has the concerns and/or needs as listed above in the chief complaint. These will be addressed in addition to the Health Maintenance Visit.   Wellness Visit: annual visit with health maintenance review and exam with Pap   Health maintenance: Due for cervical cancer screening.  Due for bone density screening.  Weight is stable.  Prevnar due, she will get at the pharmacy.  Other immunizations are up-to-date. Chronic disease f/u and/or acute problem visit: (deemed necessary to be done in addition to the wellness visit):  Hypertension, hypothyroidism and hyperlipidemia follow-up: Energy levels are good.  Blood pressures been well controlled on medications.  She is compliant with all of her medication and denies adverse effects.  She denies symptoms of CAD or CHF.  Due for lab work, she is fasting.  Restless leg syndrome well controlled with Requip.  Due refill.  No history of anemia.  Major chronic depression is well controlled on Prozac.  Patient reports 15-year history of obstructive sleep apnea on CPAP.  He even had uvulectomy.  She needs new CPAP machine supplies ordered.  She denies any problems with her machine, compliance is good.  No daytime somnolence.  She has not been to a pulmonologist.  Last sleep study was 15 years ago.  Bilateral moderate to severe arthritis of the knees: Status post Synvisc 1 injections.  Initially had good response, however she did a lot of walking in [place] 2 weeks ago and now pain is back.  No redness or swelling.  She would like to see orthopedics  next year.  She is considering knee replacement surgery.  Assessment  1. Annual physical exam   2. Benign essential hypertension   3. Acquired hypothyroidism   4. Mixed hyperlipidemia   5. Restless legs syndrome (RLS)   6. Major depression, chronic   7. Hypoestrogenism   8. Cervical cancer screening   9. OSA on CPAP      Plan  Female Wellness Visit:  Age appropriate Health Maintenance and Prevention measures were discussed with patient. Included topics are cancer screening recommendations, ways to keep healthy (see AVS) including dietary and exercise recommendations, regular eye and dental care, use of seat belts, and avoidance of moderate alcohol use and tobacco use.  Bone density ordered Pap smear with HPV testing done today.  Mammogram is up-to-date  BMI: discussed patient's BMI and encouraged positive lifestyle modifications to help get to or maintain a target BMI.  HM needs and immunizations were addressed and ordered. See below for orders. See HM and immunization section for updates.  Prevnar due, patient will get.  She has the prescription  Routine labs and screening tests ordered including cmp, cbc and lipids where appropriate.  Discussed recommendations regarding Vit D and calcium supplementation (see AVS)  Chronic disease management visit and/or acute problem visit:  Hyperlipidemia, hypertension, hypothyroidism, restless leg syndrome: All well controlled.  Check lab work.  Continue current medications at current dosages.  Depression is well controlled on chronic Prozac  Obstructive sleep apnea: We will  check with home health supplier.  Order new supplies if able.  Otherwise may need another sleep study.  Refer to orthopedics in January 2020 for consideration of knee replacement surgery.  Supportive care for now Follow up: Return in about 6 months (around 04/27/2019) for follow up Hypertension, mood follow up.  Orders Placed This Encounter  Procedures  . DG Bone Density    . CBC with Differential/Platelet  . Comprehensive metabolic panel  . Lipid panel  . TSH  . Ambulatory referral to Orthopedic Surgery   Meds ordered this encounter  Medications  . rOPINIRole (REQUIP) 3 MG tablet    Sig: Take 1 tablet (3 mg total) by mouth at bedtime.    Dispense:  90 tablet    Refill:  3  . losartan-hydrochlorothiazide (HYZAAR) 100-25 MG tablet    Sig: Take 1 tablet by mouth daily.    Dispense:  90 tablet    Refill:  3  . FLUoxetine (PROZAC) 20 MG capsule    Sig: Take 1 capsule (20 mg total) by mouth daily.    Dispense:  90 capsule    Refill:  3      Lifestyle: Body mass index is 38.22 kg/m. Wt Readings from Last 3 Encounters:  10/26/18 244 lb (110.7 kg)  09/15/18 241 lb 9.6 oz (109.6 kg)  09/01/18 240 lb (108.9 kg)   Diet: general Exercise: Limited due to knee pain,   Patient Active Problem List   Diagnosis Date Noted  . OSA on CPAP 10/26/2018    Priority: High  . Primary osteoarthritis of knees, bilateral 07/28/2018    Priority: High  . Hx of laparoscopic gastric banding+ truncal vagotomy 03/24/2006 01/10/2014    Priority: High  . Lumbar spinal stenosis 07/08/2013    Priority: High  . Major depression, chronic 07/08/2013    Priority: High  . Benign essential hypertension 04/06/2012    Priority: High  . Acquired hypothyroidism 10/08/2011    Priority: High  . Mixed hyperlipidemia 10/08/2011    Priority: High  . Obesity (BMI 30-39.9) 12/17/2010    Priority: High    Overview:  Obesity [facility]   . Restless legs syndrome (RLS) 12/20/2010    Priority: Medium  . Osteoarthritis, multiple sites 12/17/2010    Priority: Medium  . Generalized hyperhidrosis 07/15/2016    Priority: Low  . AR (allergic rhinitis) 12/20/2010    Priority: Low   Health Maintenance  Topic Date Due  . DEXA SCAN  01/15/2013  . PNA vac Low Risk Adult (1 of 2 - PCV13) 04/19/2018  . PAP SMEAR  07/03/2018  . MAMMOGRAM  11/28/2018  . COLONOSCOPY   03/03/2025  . TETANUS/TDAP  07/21/2028  . INFLUENZA VACCINE  Completed  . Hepatitis C Screening  Completed  . HIV Screening  Completed   Immunization History  Administered Date(s) Administered  . Influenza Inj Mdck Quad Pf 12/15/2014, 01/15/2016  . Influenza, High Dose Seasonal PF 07/21/2018  . Influenza, Seasonal, Injecte, Preservative Fre 11/19/2007  . Influenza,inj,Quad PF,6+ Mos 10/20/2017  . Influenza-Unspecified 10/08/2011  . PPD Test 11/19/2007  . Tdap 11/19/2007, 07/21/2018  . Zoster 07/11/2014   We updated and reviewed the patient's past history in detail and it is documented below. Allergies: Patient is allergic to codeine. Past Medical History Patient  has a past medical history of Arthritis, Depression, Hyperlipidemia, Hypertension, Hypothyroidism, Lumbar spinal stenosis (07/08/2013), and OSA on CPAP (10/26/2018). Past Surgical History Patient  has a past surgical history that includes Knee arthroscopy;  Tubal ligation; Gastric bypass; Spine surgery; and Uvulectomy. Family History: Patient family history includes Arthritis in her brother, mother, and sister; Atrial fibrillation in her mother; Diabetes in her brother; Heart disease in her father; Hyperlipidemia in her brother and mother; Hypertension in her mother; Leukemia in her father. Social History:  Patient  reports that she has never smoked. She has never used smokeless tobacco. She reports that she does not use drugs.  Review of Systems: Constitutional: negative for fever or malaise Ophthalmic: negative for photophobia, double vision or loss of vision Cardiovascular: negative for chest pain, dyspnea on exertion, or new LE swelling Respiratory: negative for SOB or persistent cough Gastrointestinal: negative for abdominal pain, change in bowel habits or melena Genitourinary: negative for dysuria or gross hematuria, no abnormal uterine bleeding or disharge Musculoskeletal: negative for new gait disturbance or  muscular weakness Integumentary: negative for new or persistent rashes, no breast lumps Neurological: negative for TIA or stroke symptoms Psychiatric: negative for SI or delusions Allergic/Immunologic: negative for hives  Patient Care Team    Relationship Specialty Notifications Start End  Leamon Arnt, MD PCP - General Family Medicine  10/16/17     Objective  Vitals: BP 112/76   Pulse 78   Temp 98.3 F (36.8 C)   Ht 5\' 7"  (1.702 m)   Wt 244 lb (110.7 kg)   SpO2 98%   BMI 38.22 kg/m  General:  Well developed, well nourished, no acute distress  Psych:  Alert and orientedx3,normal mood and affect HEENT:  Normocephalic, atraumatic, non-icteric sclera, PERRL, oropharynx is clear without mass or exudate, status post uvulectomy, supple neck without adenopathy, mass or thyromegaly Cardiovascular:  Normal S1, S2, RRR without gallop, rub or murmur, nondisplaced PMI Respiratory:  Good breath sounds bilaterally, CTAB with normal respiratory effort Gastrointestinal: normal bowel sounds, soft, non-tender, no noted masses. No HSM MSK: no deformities, contusions. Joints are without erythema or swelling. Spine and CVA region are nontender Skin:  Warm, no rashes or suspicious lesions noted Neurologic:    Mental status is normal. CN 2-11 are normal. Gross motor and sensory exams are normal. Normal gait. No tremor Breast Exam: No mass, skin retraction or nipple discharge is appreciated in either breast. No axillary adenopathy. Fibrocystic changes are not noted Pelvic Exam: Normal external genitalia, no vulvar or vaginal lesions present. Clear cervix w/o CMT. Bimanual exam reveals a nontender fundus w/o masses, nl size. No adnexal masses present. No inguinal adenopathy. A PAP smear was performed.    Commons side effects, risks, benefits, and alternatives for medications and treatment plan prescribed today were discussed, and the patient expressed understanding of the given instructions. Patient is  instructed to call or message via MyChart if he/she has any questions or concerns regarding our treatment plan. No barriers to understanding were identified. We discussed Red Flag symptoms and signs in detail. Patient expressed understanding regarding what to do in case of urgent or emergency type symptoms.   Medication list was reconciled, printed and provided to the patient in AVS. Patient instructions and summary information was reviewed with the patient as documented in the AVS. This note was prepared with assistance of Dragon voice recognition software. Occasional wrong-word or sound-a-like substitutions may have occurred due to the inherent limitations of voice recognition software

## 2018-10-26 NOTE — Patient Instructions (Signed)
Please return in 6 months for follow up of your hypertension.  I will release your lab results to you on your MyChart account with further instructions. Please reply with any questions.   We will call about your cpap machine supplies.   We will call you with information regarding your referral appointment. [facility] for your bone density testing.  If you do not hear from [place] within the next 2 weeks, please let me know. It can take 1-2 weeks to get appointments set up with the specialists.    If you have any questions or concerns, please don't hesitate to send me a message via MyChart or call the office at [phone]. Thank you for visiting with [place] today! It's our pleasure caring for you.  Please do these things to maintain good health!   Exercise at least 30-45 minutes a day,  4-5 days a week.   Eat a low-fat diet with lots of fruits and vegetables, up to 7-9 servings per day.  Drink plenty of water daily. Try to drink 8 8oz glasses per day.  Seatbelts can save your life. Always wear your seatbelt.  Place Smoke Detectors on every level of your home and check batteries every year.  Schedule an appointment with an eye doctor for an eye exam every 1-2 years  Safe sex - use condoms to protect yourself from STDs if you could be exposed to these types of infections. Use birth control if you do not want to become pregnant and are sexually active.  Avoid heavy alcohol use. If you drink, keep it to less than 2 drinks/day and not every day.  [place].  Choose someone you trust that could speak for you if you became unable to speak for yourself.  Depression is common in our stressful world.If you're feeling down or losing interest in things you normally enjoy, please come in for a visit.  If anyone is threatening or hurting you, please get help. Physical or Emotional Violence is never OK.

## 2018-10-26 NOTE — Addendum Note (Signed)
Addended bySigurd Sos on: 10/26/2018 03:19 PM   Modules accepted: Orders

## 2018-10-27 LAB — CYTOLOGY - PAP: Diagnosis: NEGATIVE

## 2018-11-10 ENCOUNTER — Encounter: Payer: Self-pay | Admitting: Family Medicine

## 2018-11-10 ENCOUNTER — Telehealth: Payer: Self-pay | Admitting: *Deleted

## 2018-11-10 MED ORDER — MELOXICAM 15 MG PO TABS: 7.5000 mg | ORAL_TABLET | Freq: Every day | ORAL | 1 refills | Status: DC

## 2018-11-10 MED ORDER — ROSUVASTATIN CALCIUM 10 MG PO TABS: 10.0000 mg | ORAL_TABLET | Freq: Every day | ORAL | 1 refills | Status: DC

## 2018-11-10 NOTE — Telephone Encounter (Signed)
Medication refill

## 2018-11-17 ENCOUNTER — Telehealth: Payer: Self-pay | Admitting: Family Medicine

## 2018-11-17 NOTE — Telephone Encounter (Signed)
Pt dropped off surgical clearance forms to be completed, placed in bin upfront w/charge sheet.

## 2018-11-20 LAB — HM DEXA SCAN: HM Dexa Scan: NORMAL

## 2018-11-20 NOTE — Telephone Encounter (Signed)
Pt aware forms have been completed and faxed.

## 2018-11-23 ENCOUNTER — Encounter: Payer: Self-pay | Admitting: *Deleted

## 2018-12-01 LAB — PULMONARY FUNCTION TEST

## 2018-12-03 NOTE — Telephone Encounter (Signed)
Copied from [place] [phone]. Topic: General - Other >> Dec 03, 2018 12:56 PM Yvette Rack wrote: Reason for CRM: Pt called for an update on when the paperwork will be faxed to Dr. Theda Sers' office so she can be scheduled for surgery. Cb# [phone]

## 2018-12-07 NOTE — Telephone Encounter (Signed)
Form re-faxed

## 2018-12-07 NOTE — Telephone Encounter (Signed)
Patient calling and states that Dr Theda Sers' office is saying that they have not received this form via fax. Would like to know if this could be re-faxed to Dr Theda Sers' office? Please advise.

## 2018-12-14 ENCOUNTER — Ambulatory Visit: Payer: Self-pay | Admitting: Orthopedic Surgery

## 2018-12-23 NOTE — H&P (Signed)
TOTAL KNEE ADMISSION H&P  Patient is being admitted for right total knee arthroplasty.  Subjective:  Chief Complaint:right knee pain.  HPI: Veronica Finley, 68 y.o. female, has a history of pain and functional disability in the right knee due to arthritis and has failed non-surgical conservative treatments for greater than 12 weeks to includecorticosteriod injections, viscosupplementation injections, use of assistive devices and activity modification.  Onset of symptoms was gradual, starting 7 years ago with gradually worsening course since that time. The patient noted prior procedures on the knee to include  arthroscopy on the right knee(s).  Patient currently rates pain in the right knee(s) at 8 out of 10 with activity. Patient has worsening of pain with activity and weight bearing, pain that interferes with activities of daily living and joint swelling.  Patient has evidence of subchondral sclerosis, periarticular osteophytes and joint space narrowing by imaging studies. There is no active infection.  Patient Active Problem List   Diagnosis Date Noted  . OSA on CPAP 10/26/2018  . Primary osteoarthritis of knees, bilateral 07/28/2018  . Generalized hyperhidrosis 07/15/2016  . Hx of laparoscopic gastric banding+ truncal vagotomy 03/24/2006 01/10/2014  . Lumbar spinal stenosis 07/08/2013  . Major depression, chronic 07/08/2013  . Benign essential hypertension 04/06/2012  . Acquired hypothyroidism 10/08/2011  . Mixed hyperlipidemia 10/08/2011  . AR (allergic rhinitis) 12/20/2010  . Restless legs syndrome (RLS) 12/20/2010  . Obesity (BMI 30-39.9) 12/17/2010  . Osteoarthritis, multiple sites 12/17/2010   Past Medical History:  Diagnosis Date  . Arthritis   . Depression   . Hyperlipidemia   . Hypertension   . Hypothyroidism   . Lumbar spinal stenosis 07/08/2013  . OSA on CPAP 10/26/2018    Past Surgical History:  Procedure Laterality Date  . GASTRIC BYPASS    . KNEE ARTHROSCOPY     . SPINE SURGERY    . TUBAL LIGATION    . UVULECTOMY      No current facility-administered medications for this encounter.    Current Outpatient Medications  Medication Sig Dispense Refill Last Dose  . Calcium Carbonate (CAL-CO3S PO) Take by mouth.   Taking  . Cholecalciferol (VITAMIN D) 2000 units tablet Take by mouth.   Taking  . diltiazem (CARTIA XT) 240 MG 24 hr capsule Take 1 capsule (240 mg total) by mouth daily. 90 capsule 3 Taking  . FLUoxetine (PROZAC) 20 MG capsule Take 1 capsule (20 mg total) by mouth daily. 90 capsule 3   . GLUCOSAMINE HCL PO Take by mouth.   Taking  . levothyroxine (SYNTHROID, LEVOTHROID) 175 MCG tablet Take 1 tablet (175 mcg total) by mouth daily before breakfast. 90 tablet 3 Taking  . losartan-hydrochlorothiazide (HYZAAR) 100-25 MG tablet Take 1 tablet by mouth daily. 90 tablet 3   . meloxicam (MOBIC) 15 MG tablet Take 0.5-1 tablets (7.5-15 mg total) by mouth daily. 90 tablet 1   . rOPINIRole (REQUIP) 3 MG tablet Take 1 tablet (3 mg total) by mouth at bedtime. 90 tablet 3   . rosuvastatin (CRESTOR) 10 MG tablet Take 1 tablet (10 mg total) by mouth at bedtime. 90 tablet 1    Allergies  Allergen Reactions  . Codeine Other (See Comments)    "makes pass out"    Social History   Tobacco Use  . Smoking status: Never Smoker  . Smokeless tobacco: Never Used  Substance Use Topics  . Alcohol use: Not on file    Comment: rarely    Family History  Problem Relation Age  of Onset  . Arthritis Mother   . Atrial fibrillation Mother   . Hyperlipidemia Mother   . Hypertension Mother   . Leukemia Father   . Heart disease Father   . Arthritis Sister   . Arthritis Brother   . Diabetes Brother   . Hyperlipidemia Brother      Review of Systems  Constitutional: Negative.   HENT: Negative.   Eyes: Negative.   Respiratory: Negative.   Cardiovascular: Negative.   Genitourinary: Negative.   Musculoskeletal: Positive for joint pain.  Skin: Negative.    Neurological: Negative.   Endo/Heme/Allergies: Negative.   Psychiatric/Behavioral: Negative.     Objective:  Physical Exam  Constitutional: She is oriented to person, place, and time. She appears well-developed.  HENT:  Head: Normocephalic.  Eyes: EOM are normal.  Neck: Normal range of motion.  Cardiovascular: Normal rate and intact distal pulses.  Respiratory: Effort normal.  GI: Soft.  Genitourinary:    Genitourinary Comments: Deferred   Musculoskeletal:     Comments: Limited Rom of  Knee. Crepitus of the knee. Stable at varus and valgus stress.   Neurological: She is alert and oriented to person, place, and time.  Skin: Skin is warm and dry.  Psychiatric: Her behavior is normal.    Vital signs in last 24 hours: BP: ()/()  Arterial Line BP: ()/()   Labs:   Estimated body mass index is 38.22 kg/m as calculated from the following:   Height as of 10/26/18: 5\' 7"  (1.702 m).   Weight as of 10/26/18: 110.7 kg.   Imaging Review Plain radiographs demonstrate severe degenerative joint disease of the right knee(s). The overall alignment ismild varus. The bone quality appears to be good for age and reported activity level.      Assessment/Plan:  End stage arthritis, right knee   The patient history, physical examination, clinical judgment of the provider and imaging studies are consistent with end stage degenerative joint disease of the right knee(s) and total knee arthroplasty is deemed medically necessary. The treatment options including medical management, injection therapy arthroscopy and arthroplasty were discussed at length. The risks and benefits of total knee arthroplasty were presented and reviewed. The risks due to aseptic loosening, infection, stiffness, patella tracking problems, thromboembolic complications and other imponderables were discussed. The patient acknowledged the explanation, agreed to proceed with the plan and consent was signed. Patient is being  admitted for inpatient treatment for surgery, pain control, PT, OT, prophylactic antibiotics, VTE prophylaxis, progressive ambulation and ADL's and discharge planning. The patient is planning to be discharged home with home health services    Anticipated LOS equal to or greater than 2 midnights due to - Age 19 and older with one or more of the following:  - Obesity  - Expected need for hospital services (PT, OT, Nursing) required for safe  discharge  - Anticipated need for postoperative skilled nursing care or inpatient rehab  - Active co-morbidities: None OR   - Unanticipated findings during/Post Surgery: None  - Patient is a high risk of re-admission due to: None

## 2019-01-07 NOTE — Patient Instructions (Addendum)
SHAWNESE MAGNER  01/07/2019   Your procedure is scheduled on: 01-15-2019   Report to [facility] Main  Entrance    Report to [facility] at 5:30AM    Call this number if you have problems the morning of surgery [phone]     Remember: Do not eat food or drink liquids :After Midnight. BRUSH YOUR TEETH MORNING OF SURGERY AND RINSE YOUR MOUTH OUT, NO CHEWING GUM CANDY OR MINTS.     Take these medicines the morning of surgery with A SIP OF WATER: DILTIAZEM, FLUOXETINE, LEVOTHYROXINE                                 You may not have any metal on your body including hair pins and              piercings  Do not wear jewelry, make-up, lotions, powders or perfumes, deodorant             Do not wear nail polish.  Do not shave  48 hours prior to surgery.     Do not bring valuables to the hospital. [place].  Contacts, dentures or bridgework may not be worn into surgery.  Leave suitcase in the car. After surgery it may be brought to your room.                   Please read over the following fact sheets you were given: _____________________________________________________________________             [facility] - Preparing for Surgery Before surgery, you can play an important role.  Because skin is not sterile, your skin needs to be as free of germs as possible.  You can reduce the number of germs on your skin by washing with CHG (chlorahexidine gluconate) soap before surgery.  CHG is an antiseptic cleaner which kills germs and bonds with the skin to continue killing germs even after washing. Please DO NOT use if you have an allergy to CHG or antibacterial soaps.  If your skin becomes reddened/irritated stop using the CHG and inform your nurse when you arrive at Short Stay. Do not shave (including legs and underarms) for at least 48 hours prior to the first CHG shower.  You may shave your face/neck. Please  follow these instructions carefully:  1.  Shower with CHG Soap the night before surgery and the  morning of Surgery.  2.  If you choose to wash your hair, wash your hair first as usual with your  normal  shampoo.  3.  After you shampoo, rinse your hair and body thoroughly to remove the  shampoo.                           4.  Use CHG as you would any other liquid soap.  You can apply chg directly  to the skin and wash                       Gently with a scrungie or clean washcloth.  5.  Apply the CHG Soap to your body ONLY FROM THE NECK DOWN.   Do not use on face/ open  Wound or open sores. Avoid contact with eyes, ears mouth and genitals (private parts).                       Wash face,  Genitals (private parts) with your normal soap.             6.  Wash thoroughly, paying special attention to the area where your surgery  will be performed.  7.  Thoroughly rinse your body with warm water from the neck down.  8.  DO NOT shower/wash with your normal soap after using and rinsing off  the CHG Soap.                9.  Pat yourself dry with a clean towel.            10.  Wear clean pajamas.            11.  Place clean sheets on your bed the night of your first shower and do not  sleep with pets. Day of Surgery : Do not apply any lotions/deodorants the morning of surgery.  Please wear clean clothes to the hospital/surgery center.  FAILURE TO FOLLOW THESE INSTRUCTIONS MAY RESULT IN THE CANCELLATION OF YOUR SURGERY PATIENT SIGNATURE_________________________________  NURSE SIGNATURE__________________________________  ________________________________________________________________________   [name]  An incentive spirometer is a tool that can help keep your lungs clear and active. This tool measures how well you are filling your lungs with each breath. Taking long deep breaths may help reverse or decrease the chance of developing breathing (pulmonary) problems  (especially infection) following:  A long period of time when you are unable to move or be active. BEFORE THE PROCEDURE   If the spirometer includes an indicator to show your best effort, your nurse or respiratory therapist will set it to a desired goal.  If possible, sit up straight or lean slightly forward. Try not to slouch.  Hold the incentive spirometer in an upright position. INSTRUCTIONS FOR USE  1. Sit on the edge of your bed if possible, or sit up as far as you can in bed or on a chair. 2. Hold the incentive spirometer in an upright position. 3. Breathe out normally. 4. Place the mouthpiece in your mouth and seal your lips tightly around it. 5. Breathe in slowly and as deeply as possible, raising the piston or the ball toward the top of the column. 6. Hold your breath for 3-5 seconds or for as long as possible. Allow the piston or ball to fall to the bottom of the column. 7. Remove the mouthpiece from your mouth and breathe out normally. 8. Rest for a few seconds and repeat Steps 1 through 7 at least 10 times every 1-2 hours when you are awake. Take your time and take a few normal breaths between deep breaths. 9. The spirometer may include an indicator to show your best effort. Use the indicator as a goal to work toward during each repetition. 10. After each set of 10 deep breaths, practice coughing to be sure your lungs are clear. If you have an incision (the cut made at the time of surgery), support your incision when coughing by placing a pillow or rolled up towels firmly against it. Once you are able to get out of bed, walk around indoors and cough well. You may stop using the incentive spirometer when instructed by your caregiver.  RISKS AND COMPLICATIONS  Take your time so you do not get  dizzy or light-headed.  If you are in pain, you may need to take or ask for pain medication before doing incentive spirometry. It is harder to take a deep breath if you are having  pain. AFTER USE  Rest and breathe slowly and easily.  It can be helpful to keep track of a log of your progress. Your caregiver can provide you with a simple table to help with this. If you are using the spirometer at home, follow these instructions: [place] IF:   You are having difficultly using the spirometer.  You have trouble using the spirometer as often as instructed.  Your pain medication is not giving enough relief while using the spirometer.  You develop fever of 100.5 F (38.1 C) or higher. SEEK IMMEDIATE MEDICAL CARE IF:   You cough up bloody sputum that had not been present before.  You develop fever of 102 F (38.9 C) or greater.  You develop worsening pain at or near the incision site. MAKE SURE YOU:   Understand these instructions.  Will watch your condition.  Will get help right away if you are not doing well or get worse. Document Released: 03/17/2007 Document Revised: 01/27/2012 Document Reviewed: 05/18/2007 [facility] Patient Information 2014 [place], [place].   ________________________________________________________________________

## 2019-01-07 NOTE — Progress Notes (Signed)
CLEARANCE DR CAMILLE ANDY ON CHART

## 2019-01-08 ENCOUNTER — Other Ambulatory Visit: Payer: Self-pay

## 2019-01-08 ENCOUNTER — Encounter (HOSPITAL_COMMUNITY)
Admission: RE | Admit: 2019-01-08 | Discharge: 2019-01-08 | Disposition: A | Discharge status: Home / Self Care | Payer: Medicare Other | Source: Ambulatory Visit | Attending: Specialist | Admitting: Specialist

## 2019-01-08 ENCOUNTER — Encounter (HOSPITAL_COMMUNITY): Payer: Self-pay

## 2019-01-08 DIAGNOSIS — M1711 Unilateral primary osteoarthritis, right knee: Secondary | ICD-10-CM | POA: Diagnosis not present

## 2019-01-08 DIAGNOSIS — Z01818 Encounter for other preprocedural examination: Secondary | ICD-10-CM | POA: Diagnosis present

## 2019-01-08 DIAGNOSIS — I10 Essential (primary) hypertension: Secondary | ICD-10-CM | POA: Insufficient documentation

## 2019-01-08 HISTORY — DX: Facial myokymia: G51.4

## 2019-01-08 HISTORY — DX: Cardiac murmur, unspecified: R01.1

## 2019-01-08 LAB — CBC
HCT: 45.3 % (ref 36.0–46.0)
Hemoglobin: 14.4 g/dL (ref 12.0–15.0)
MCH: 29.8 pg (ref 26.0–34.0)
MCHC: 31.8 g/dL (ref 30.0–36.0)
MCV: 93.6 fL (ref 80.0–100.0)
Platelets: 294 10*3/uL (ref 150–400)
RBC: 4.84 MIL/uL (ref 3.87–5.11)
RDW: 12.9 % (ref 11.5–15.5)
WBC: 8.1 10*3/uL (ref 4.0–10.5)
nRBC: 0 % (ref 0.0–0.2)

## 2019-01-08 LAB — BASIC METABOLIC PANEL
Anion gap: 7 (ref 5–15)
BUN: 13 mg/dL (ref 8–23)
CO2: 28 mmol/L (ref 22–32)
Calcium: 9.4 mg/dL (ref 8.9–10.3)
Chloride: 105 mmol/L (ref 98–111)
Creatinine, Ser: 1.07 mg/dL — ABNORMAL HIGH (ref 0.44–1.00)
GFR calc Af Amer: >60 mL/min (ref >60)
GFR calc non Af Amer: 54 mL/min — ABNORMAL LOW (ref >60)
Glucose, Bld: 101 mg/dL — ABNORMAL HIGH (ref 70–99)
Potassium: 4.3 mmol/L (ref 3.5–5.1)
Sodium: 140 mmol/L (ref 135–145)

## 2019-01-08 LAB — URINALYSIS, ROUTINE W REFLEX MICROSCOPIC
Bilirubin Urine: NEGATIVE
Glucose, UA: NEGATIVE mg/dL
Hgb urine dipstick: NEGATIVE
Ketones, ur: NEGATIVE mg/dL
Leukocytes,Ua: NEGATIVE
Nitrite: NEGATIVE
Protein, ur: NEGATIVE mg/dL
Specific Gravity, Urine: 1.014 (ref 1.005–1.030)
pH: 6 (ref 5.0–8.0)

## 2019-01-08 LAB — PROTIME-INR
INR: 1
Prothrombin Time: 13.1 seconds (ref 11.4–15.2)

## 2019-01-08 LAB — SURGICAL PCR SCREEN
MRSA, PCR: NEGATIVE
Staphylococcus aureus: NEGATIVE

## 2019-01-08 LAB — APTT: aPTT: 30 seconds (ref 24–36)

## 2019-01-08 LAB — ABO/RH: ABO/RH(D): O POS

## 2019-01-14 MED ORDER — BUPIVACAINE LIPOSOME 1.3 % IJ SUSP
20.0000 mL | INTRAMUSCULAR | Status: DC
  Filled 2019-01-14: qty 20

## 2019-01-14 NOTE — Anesthesia Preprocedure Evaluation (Addendum)
Anesthesia Evaluation  Patient identified by MRN, date of birth, ID band Patient awake    Reviewed: Allergy & Precautions, NPO status , Patient's Chart, lab work & pertinent test results  History of Anesthesia Complications Negative for: history of anesthetic complications  Airway Mallampati: II  TM Distance: >3 FB Neck ROM: Full    Dental  (+) Teeth Intact, Dental Advisory Given   Pulmonary sleep apnea and Continuous Positive Airway Pressure Ventilation ,    Pulmonary exam normal breath sounds clear to auscultation       Cardiovascular hypertension, Pt. on medications Normal cardiovascular exam Rhythm:Regular Rate:Normal     Neuro/Psych Depression Remote lumbar back surgery    GI/Hepatic negative GI ROS, Neg liver ROS,   Endo/Other  Hypothyroidism   Renal/GU negative Renal ROS     Musculoskeletal  (+) Arthritis ,   Abdominal   Peds  Hematology negative hematology ROS (+)   Anesthesia Other Findings Day of surgery medications reviewed with the patient.  Reproductive/Obstetrics                            Anesthesia Physical Anesthesia Plan  ASA: III  Anesthesia Plan: Spinal   Post-op Pain Management:  Regional for Post-op pain   Induction:   PONV Risk Score and Plan: 3 and Treatment may vary due to age or medical condition, Ondansetron, Propofol infusion and Dexamethasone  Airway Management Planned: Natural Airway and Simple Face Mask  Additional Equipment: None  Intra-op Plan:   Post-operative Plan:   Informed Consent: I have reviewed the patients History and Physical, chart, labs and discussed the procedure including the risks, benefits and alternatives for the proposed anesthesia with the patient or authorized representative who has indicated his/her understanding and acceptance.     Dental advisory given  Plan Discussed with: CRNA  Anesthesia Plan Comments:         Anesthesia Quick Evaluation

## 2019-01-15 ENCOUNTER — Encounter (HOSPITAL_COMMUNITY): Payer: Self-pay

## 2019-01-15 ENCOUNTER — Other Ambulatory Visit: Payer: Self-pay

## 2019-01-15 ENCOUNTER — Ambulatory Visit (HOSPITAL_COMMUNITY)
Admission: RE | Admit: 2019-01-15 | Discharge: 2019-01-15 | Disposition: A | Discharge status: Home / Self Care | Payer: Medicare Other | Attending: Specialist | Admitting: Specialist

## 2019-01-15 ENCOUNTER — Ambulatory Visit (HOSPITAL_COMMUNITY): Payer: Medicare Other | Admitting: Anesthesiology

## 2019-01-15 ENCOUNTER — Encounter (HOSPITAL_COMMUNITY)
Admission: RE | Disposition: A | Discharge status: Home / Self Care | Payer: Self-pay | Source: Home / Self Care | Attending: Specialist

## 2019-01-15 ENCOUNTER — Ambulatory Visit (HOSPITAL_COMMUNITY): Payer: Medicare Other | Admitting: Physician Assistant

## 2019-01-15 DIAGNOSIS — Z806 Family history of leukemia: Secondary | ICD-10-CM | POA: Diagnosis not present

## 2019-01-15 DIAGNOSIS — R61 Generalized hyperhidrosis: Secondary | ICD-10-CM | POA: Insufficient documentation

## 2019-01-15 DIAGNOSIS — Z833 Family history of diabetes mellitus: Secondary | ICD-10-CM | POA: Insufficient documentation

## 2019-01-15 DIAGNOSIS — G2581 Restless legs syndrome: Secondary | ICD-10-CM | POA: Insufficient documentation

## 2019-01-15 DIAGNOSIS — E669 Obesity, unspecified: Secondary | ICD-10-CM | POA: Insufficient documentation

## 2019-01-15 DIAGNOSIS — G4733 Obstructive sleep apnea (adult) (pediatric): Secondary | ICD-10-CM | POA: Insufficient documentation

## 2019-01-15 DIAGNOSIS — E785 Hyperlipidemia, unspecified: Secondary | ICD-10-CM | POA: Insufficient documentation

## 2019-01-15 DIAGNOSIS — Z791 Long term (current) use of non-steroidal anti-inflammatories (NSAID): Secondary | ICD-10-CM | POA: Diagnosis not present

## 2019-01-15 DIAGNOSIS — Z8249 Family history of ischemic heart disease and other diseases of the circulatory system: Secondary | ICD-10-CM | POA: Diagnosis not present

## 2019-01-15 DIAGNOSIS — I10 Essential (primary) hypertension: Secondary | ICD-10-CM | POA: Diagnosis not present

## 2019-01-15 DIAGNOSIS — M1711 Unilateral primary osteoarthritis, right knee: Secondary | ICD-10-CM | POA: Insufficient documentation

## 2019-01-15 DIAGNOSIS — Z5309 Procedure and treatment not carried out because of other contraindication: Secondary | ICD-10-CM | POA: Insufficient documentation

## 2019-01-15 DIAGNOSIS — F329 Major depressive disorder, single episode, unspecified: Secondary | ICD-10-CM | POA: Diagnosis not present

## 2019-01-15 DIAGNOSIS — Z8261 Family history of arthritis: Secondary | ICD-10-CM | POA: Diagnosis not present

## 2019-01-15 DIAGNOSIS — Z9884 Bariatric surgery status: Secondary | ICD-10-CM | POA: Diagnosis not present

## 2019-01-15 DIAGNOSIS — E039 Hypothyroidism, unspecified: Secondary | ICD-10-CM | POA: Insufficient documentation

## 2019-01-15 DIAGNOSIS — Z6838 Body mass index (BMI) 38.0-38.9, adult: Secondary | ICD-10-CM | POA: Insufficient documentation

## 2019-01-15 DIAGNOSIS — Z79899 Other long term (current) drug therapy: Secondary | ICD-10-CM | POA: Insufficient documentation

## 2019-01-15 LAB — TYPE AND SCREEN
ABO/RH(D): O POS
Antibody Screen: NEGATIVE

## 2019-01-15 SURGERY — ARTHROPLASTY, KNEE, TOTAL
Anesthesia: Spinal

## 2019-01-15 MED ORDER — CHLORHEXIDINE GLUCONATE 4 % EX LIQD: 60.0000 mL | Freq: Once | CUTANEOUS | Status: DC

## 2019-01-15 MED ORDER — PROPOFOL 10 MG/ML IV BOLUS
INTRAVENOUS | Status: AC
  Filled 2019-01-15: qty 60

## 2019-01-15 MED ORDER — FENTANYL CITRATE (PF) 100 MCG/2ML IJ SOLN: 25.0000 ug | INTRAMUSCULAR | Status: DC | PRN

## 2019-01-15 MED ORDER — ONDANSETRON HCL 4 MG/2ML IJ SOLN
INTRAMUSCULAR | Status: DC | PRN
  Administered 2019-01-15: 4 mg via INTRAVENOUS

## 2019-01-15 MED ORDER — FENTANYL CITRATE (PF) 100 MCG/2ML IJ SOLN
INTRAMUSCULAR | Status: DC | PRN
  Administered 2019-01-15 (×2): 50 ug via INTRAVENOUS

## 2019-01-15 MED ORDER — CEFAZOLIN SODIUM-DEXTROSE 2-4 GM/100ML-% IV SOLN
2.0000 g | INTRAVENOUS | Status: AC
  Administered 2019-01-15: 2 g via INTRAVENOUS
  Filled 2019-01-15: qty 100

## 2019-01-15 MED ORDER — DEXAMETHASONE SODIUM PHOSPHATE 10 MG/ML IJ SOLN
INTRAMUSCULAR | Status: DC | PRN
  Administered 2019-01-15: 10 mg via INTRAVENOUS

## 2019-01-15 MED ORDER — LACTATED RINGERS IV SOLN
INTRAVENOUS | Status: DC | PRN
  Administered 2019-01-15: 07:00:00 via INTRAVENOUS

## 2019-01-15 MED ORDER — MIDAZOLAM HCL 5 MG/5ML IJ SOLN
INTRAMUSCULAR | Status: DC | PRN
  Administered 2019-01-15: 2 mg via INTRAVENOUS

## 2019-01-15 MED ORDER — BUPIVACAINE-EPINEPHRINE (PF) 0.5% -1:200000 IJ SOLN
INTRAMUSCULAR | Status: DC | PRN
  Administered 2019-01-15: 15 mL via PERINEURAL

## 2019-01-15 MED ORDER — EPHEDRINE 5 MG/ML INJ
INTRAVENOUS | Status: AC
  Filled 2019-01-15: qty 10

## 2019-01-15 MED ORDER — OXYCODONE HCL 5 MG/5ML PO SOLN: 5.0000 mg | Freq: Once | ORAL | Status: DC | PRN

## 2019-01-15 MED ORDER — PROPOFOL 500 MG/50ML IV EMUL
INTRAVENOUS | Status: DC | PRN
  Administered 2019-01-15: 100 ug/kg/min via INTRAVENOUS

## 2019-01-15 MED ORDER — BUPIVACAINE IN DEXTROSE 0.75-8.25 % IT SOLN
INTRATHECAL | Status: DC | PRN
  Administered 2019-01-15: 1.6 mL via INTRATHECAL

## 2019-01-15 MED ORDER — ACETAMINOPHEN 10 MG/ML IV SOLN: 1000.0000 mg | Freq: Once | INTRAVENOUS | Status: DC | PRN

## 2019-01-15 MED ORDER — ONDANSETRON HCL 4 MG/2ML IJ SOLN
INTRAMUSCULAR | Status: AC
  Filled 2019-01-15: qty 2

## 2019-01-15 MED ORDER — DEXAMETHASONE SODIUM PHOSPHATE 10 MG/ML IJ SOLN
INTRAMUSCULAR | Status: AC
  Filled 2019-01-15: qty 1

## 2019-01-15 MED ORDER — PROPOFOL 10 MG/ML IV BOLUS
INTRAVENOUS | Status: DC | PRN
  Administered 2019-01-15: 20 mg via INTRAVENOUS

## 2019-01-15 MED ORDER — FENTANYL CITRATE (PF) 100 MCG/2ML IJ SOLN
INTRAMUSCULAR | Status: AC
  Filled 2019-01-15: qty 2

## 2019-01-15 MED ORDER — OXYCODONE HCL 5 MG PO TABS: 5.0000 mg | ORAL_TABLET | Freq: Once | ORAL | Status: DC | PRN

## 2019-01-15 MED ORDER — PHENYLEPHRINE 40 MCG/ML (10ML) SYRINGE FOR IV PUSH (FOR BLOOD PRESSURE SUPPORT)
PREFILLED_SYRINGE | INTRAVENOUS | Status: AC
  Filled 2019-01-15: qty 10

## 2019-01-15 MED ORDER — SODIUM CHLORIDE (PF) 0.9 % IJ SOLN
INTRAMUSCULAR | Status: AC
  Filled 2019-01-15: qty 10

## 2019-01-15 MED ORDER — PROMETHAZINE HCL 25 MG/ML IJ SOLN: 6.2500 mg | INTRAMUSCULAR | Status: DC | PRN

## 2019-01-15 MED ORDER — SODIUM CHLORIDE (PF) 0.9 % IJ SOLN
INTRAMUSCULAR | Status: AC
  Filled 2019-01-15: qty 50

## 2019-01-15 MED ORDER — MIDAZOLAM HCL 2 MG/2ML IJ SOLN
INTRAMUSCULAR | Status: AC
  Filled 2019-01-15: qty 2

## 2019-01-15 SURGICAL SUPPLY — 56 items
ADH SKN CLS APL DERMABOND .7 (GAUZE/BANDAGES/DRESSINGS) ×1
BAG DECANTER FOR FLEXI CONT (MISCELLANEOUS) IMPLANT
BAG SPEC THK2 15X12 ZIP CLS (MISCELLANEOUS) ×2
BAG ZIPLOCK 12X15 (MISCELLANEOUS) ×6 IMPLANT
BANDAGE ACE 4X5 VEL STRL LF (GAUZE/BANDAGES/DRESSINGS) ×3 IMPLANT
BANDAGE ACE 6X5 VEL STRL LF (GAUZE/BANDAGES/DRESSINGS) ×3 IMPLANT
BLADE SAG 18X100X1.27 (BLADE) ×3 IMPLANT
BLADE SAW SGTL 11.0X1.19X90.0M (BLADE) ×3 IMPLANT
BOWL SMART MIX CTS (DISPOSABLE) ×3 IMPLANT
CEMENT HV SMART SET (Cement) IMPLANT
COVER SURGICAL LIGHT HANDLE (MISCELLANEOUS) ×3 IMPLANT
COVER WAND RF STERILE (DRAPES) IMPLANT
CUFF TOURN SGL QUICK 34 (TOURNIQUET CUFF) ×2
CUFF TRNQT CYL 34X4.125X (TOURNIQUET CUFF) ×2 IMPLANT
DECANTER SPIKE VIAL GLASS SM (MISCELLANEOUS) ×3 IMPLANT
DERMABOND ADVANCED (GAUZE/BANDAGES/DRESSINGS) ×1
DERMABOND ADVANCED .7 DNX12 (GAUZE/BANDAGES/DRESSINGS) ×2 IMPLANT
DRAPE U-SHAPE 47X51 STRL (DRAPES) ×3 IMPLANT
DRSG AQUACEL AG ADV 3.5X10 (GAUZE/BANDAGES/DRESSINGS) ×3 IMPLANT
DRSG TEGADERM 4X4.75 (GAUZE/BANDAGES/DRESSINGS) ×3 IMPLANT
DURAPREP 26ML APPLICATOR (WOUND CARE) ×6 IMPLANT
ELECT REM PT RETURN 15FT ADLT (MISCELLANEOUS) ×3 IMPLANT
EVACUATOR 1/8 PVC DRAIN (DRAIN) ×3 IMPLANT
GAUZE SPONGE 2X2 8PLY STRL LF (GAUZE/BANDAGES/DRESSINGS) ×2 IMPLANT
GLOVE BIO SURGEON STRL SZ7.5 (GLOVE) ×6 IMPLANT
GLOVE BIOGEL PI IND STRL 8 (GLOVE) ×4 IMPLANT
GLOVE BIOGEL PI INDICATOR 8 (GLOVE) ×2
GLOVE ECLIPSE 8.0 STRL XLNG CF (GLOVE) ×6 IMPLANT
GLOVE SURG ORTHO 9.0 STRL STRW (GLOVE) ×3 IMPLANT
GOWN STRL REUS W/TWL XL LVL3 (GOWN DISPOSABLE) ×6 IMPLANT
HANDPIECE INTERPULSE COAX TIP (DISPOSABLE) ×2
HOLDER FOLEY CATH W/STRAP (MISCELLANEOUS) IMPLANT
NS IRRIG 1000ML POUR BTL (IV SOLUTION) ×3 IMPLANT
PACK TOTAL KNEE CUSTOM (KITS) ×3 IMPLANT
PROTECTOR NERVE ULNAR (MISCELLANEOUS) ×3 IMPLANT
SET HNDPC FAN SPRY TIP SCT (DISPOSABLE) ×2 IMPLANT
SET PAD KNEE POSITIONER (MISCELLANEOUS) ×3 IMPLANT
SPONGE GAUZE 2X2 STER 10/PKG (GAUZE/BANDAGES/DRESSINGS) ×1
SPONGE LAP 18X18 RF (DISPOSABLE) IMPLANT
SPONGE SURGIFOAM ABS GEL 100 (HEMOSTASIS) ×3 IMPLANT
STOCKINETTE 6  STRL (DRAPES) ×1
STOCKINETTE 6 STRL (DRAPES) ×2 IMPLANT
SUT BONE WAX W31G (SUTURE) IMPLANT
SUT MNCRL AB 3-0 PS2 18 (SUTURE) ×3 IMPLANT
SUT VIC AB 1 CT1 27 (SUTURE) ×6
SUT VIC AB 1 CT1 27XBRD ANTBC (SUTURE) ×6 IMPLANT
SUT VIC AB 2-0 CT1 27 (SUTURE) ×4
SUT VIC AB 2-0 CT1 TAPERPNT 27 (SUTURE) ×4 IMPLANT
SUT VLOC 180 0 24IN GS25 (SUTURE) ×3 IMPLANT
SYR 50ML LL SCALE MARK (SYRINGE) IMPLANT
TAPE STRIPS DRAPE STRL (GAUZE/BANDAGES/DRESSINGS) ×3 IMPLANT
TRAY FOLEY BAG SILVER LF 14FR (CATHETERS) ×2 IMPLANT
TRAY FOLEY MTR SLVR 16FR STAT (SET/KITS/TRAYS/PACK) ×3 IMPLANT
WATER STERILE IRR 1000ML POUR (IV SOLUTION) ×6 IMPLANT
WRAP KNEE MAXI GEL POST OP (GAUZE/BANDAGES/DRESSINGS) ×3 IMPLANT
YANKAUER SUCT BULB TIP 10FT TU (MISCELLANEOUS) ×3 IMPLANT

## 2019-01-15 NOTE — Progress Notes (Signed)
PT Cancellation Note  Patient Details Name: Veronica Finley MRN: [id number] DOB: 1951-11-08   Cancelled Treatment:    Reason Eval/Treat Not Completed: PT screened, no needs identified, will sign off - Pt's TKR surgery cancelled, pt received spinal anesthesia but had no other procedure. Per Anesthesiology note: "Case cancelled after anesthesia started due to nickel allergy and presence of nickel in planned implant. Patient will stay in PACU until able to ambulate safely with walker and will have someone with her the remainder of the day. All questions answered. Daiva Huge, MD". PT called PACU, and PT and RN Veronica Finley agree no PT consult is needed. PT signing off, please re-consult if needed.  Veronica Finley, PT Acute Rehabilitation Services Pager [phone]  Office [phone]    Veronica Finley D Elonda Husky 01/15/2019, 1:44 PM

## 2019-01-15 NOTE — Transfer of Care (Signed)
Immediate Anesthesia Transfer of Care Note  Patient: Veronica Finley  Procedure(s) Performed: TOTAL KNEE ARTHROPLASTY (Right Knee)  Patient Location: PACU  Anesthesia Type:Spinal  Level of Consciousness: awake, alert  and oriented  Airway & Oxygen Therapy: Patient Spontanous Breathing  Post-op Assessment: Report given to RN and Post -op Vital signs reviewed and stable  Post vital signs: Reviewed and stable  Last Vitals:  Vitals Value Taken Time  BP 126/64 01/15/2019  9:45 AM  Temp    Pulse 80 01/15/2019  9:46 AM  Resp 14 01/15/2019  9:46 AM  SpO2 98 % 01/15/2019  9:46 AM  Vitals shown include unvalidated device data.  Last Pain:  Vitals:   01/15/19 0930  TempSrc:   PainSc: 0-No pain      Patients Stated Pain Goal: 3 (56/43/32 9518)  Complications: No apparent anesthesia complications

## 2019-01-15 NOTE — Anesthesia Postprocedure Evaluation (Signed)
Anesthesia Post Note  Patient: Veronica Finley  Procedure(s) Performed: TOTAL KNEE ARTHROPLASTY (Right Knee)     Patient location during evaluation: PACU Anesthesia Type: Spinal Level of consciousness: awake and alert Pain management: pain level controlled Vital Signs Assessment: post-procedure vital signs reviewed and stable Respiratory status: spontaneous breathing, nonlabored ventilation and respiratory function stable Cardiovascular status: blood pressure returned to baseline and stable Postop Assessment: no apparent nausea or vomiting and spinal receding Anesthetic complications: no Comments: Case cancelled after anesthesia started due to nickel allergy and presence of nickel in planned implant. Patient will stay in PACU until able to ambulate safely with walker and will have someone with her the remainder of the day. All questions answered. Daiva Huge, MD    Last Vitals:  Vitals:   01/15/19 1000 01/15/19 1015  BP: 128/61 132/79  Pulse: 75 76  Resp: 14 20  Temp:    SpO2: 96% 90%    Last Pain:  Vitals:   01/15/19 1000  TempSrc:   PainSc: 0-No pain                 Brennan Bailey

## 2019-01-15 NOTE — Anesthesia Procedure Notes (Signed)
Spinal  Patient location during procedure: OR Start time: 01/15/2019 7:46 AM Staffing Resident/CRNA: [place] Jeanean Hollett C, CRNA Performed: resident/CRNA  Preanesthetic Checklist Completed: patient identified, site marked, surgical consent, pre-op evaluation, timeout performed, IV checked, risks and benefits discussed and monitors and equipment checked Spinal Block Patient position: sitting Prep: Betadine Patient monitoring: heart rate, continuous pulse ox and blood pressure Approach: midline Location: L3-4 Injection technique: single-shot Needle Needle type: Sprotte  Needle gauge: 24 G Needle length: 9 cm Assessment Sensory level: T6 Additional Notes Expiration date of kit checked and confirmed. Patient tolerated procedure well, without complications.

## 2019-01-15 NOTE — Anesthesia Procedure Notes (Signed)
Anesthesia Regional Block: Adductor canal block   Pre-Anesthetic Checklist: ,, timeout performed, Correct Patient, Correct Site, Correct Laterality, Correct Procedure, Correct Position, site marked, Risks and benefits discussed, pre-op evaluation,  At surgeon's request and post-op pain management  Laterality: Right  Prep: Maximum Sterile Barrier Precautions used, chloraprep       Needles:  Injection technique: Single-shot  Needle Type: Echogenic Stimulator Needle     Needle Length: 9cm  Needle Gauge: 22     Additional Needles:   Procedures:,,,, ultrasound used (permanent image in chart),,,,  Narrative:  Start time: 01/15/2019 7:00 AM End time: 01/15/2019 7:03 AM Injection made incrementally with aspirations every 5 mL.  Performed by: Personally  Anesthesiologist: Brennan Bailey, MD  Additional Notes: Risks, benefits, and alternative discussed. Patient gave consent for procedure. Patient prepped and draped in sterile fashion. Sedation administered, patient remains easily responsive to voice. Relevant anatomy identified with ultrasound guidance. Local anesthetic given in 5cc increments with no signs or symptoms of intravascular injection. No pain or paraesthesias with injection. Patient monitored throughout procedure with signs of LAST or immediate complications. Tolerated well. Ultrasound image placed in chart.  Tawny Asal, MD

## 2019-01-15 NOTE — Progress Notes (Signed)
Patient has never disclosed to me nor my office staff her presumed nickel allergy. In the OR after spinal and doing time out it was disclosed to me the nickel allergy. I talked to the preop niurse who added the allergy this am that she had said she had some earrings that caused a rash and so nickel was added. At this time without the patient being tested preop for nickel allergy and our only implants currently available without nickel not available it was my decision to abort the procedure and work her up preop and follow recommended guidelines. I will discuss with family and patient.

## 2019-01-15 NOTE — Interval H&P Note (Signed)
History and Physical Interval Note:  01/15/2019 7:31 AM  Veronica Finley  has presented today for surgery, with the diagnosis of Right knee osteoarthritis  The various methods of treatment have been discussed with the patient and family. After consideration of risks, benefits and other options for treatment, the patient has consented to  Procedure(s): TOTAL KNEE ARTHROPLASTY (Right) as a surgical intervention .  The patient's history has been reviewed, patient examined, no change in status, stable for surgery.  I have reviewed the patient's chart and labs.  Questions were answered to the patient's satisfaction.     Rendy Lazard ANDREW

## 2019-01-25 ENCOUNTER — Encounter: Payer: Self-pay | Admitting: Pediatrics

## 2019-01-25 ENCOUNTER — Ambulatory Visit: Payer: Medicare Other | Admitting: Pediatrics

## 2019-01-25 VITALS — BP 128/82 | HR 82 | Temp 97.6°F | Resp 16 | Ht 65.7 in | Wt 247.4 lb

## 2019-01-25 DIAGNOSIS — M1990 Unspecified osteoarthritis, unspecified site: Secondary | ICD-10-CM

## 2019-01-25 DIAGNOSIS — L253 Unspecified contact dermatitis due to other chemical products: Secondary | ICD-10-CM | POA: Insufficient documentation

## 2019-01-25 DIAGNOSIS — I10 Essential (primary) hypertension: Secondary | ICD-10-CM | POA: Diagnosis not present

## 2019-01-25 NOTE — Patient Instructions (Signed)
Patch testing to metals applied Return in 48 hours for first reading Continuing your other medications Call us if you are not doing well on this treatment plan

## 2019-01-25 NOTE — Progress Notes (Signed)
[place] [address] Dept: [phone]  New Patient Note  Patient ID: Veronica Finley, female    DOB: Jul 03, 1951  Age: 68 y.o. MRN: [id number] Date of Office Visit: 01/25/2019 Referring provider: Leamon Arnt, MD [address] [place], [place] [address]    Chief Complaint: Patch Testing (nickle allergy in the past)  HPI Veronica Finley presents for patch testing to metals.  She is scheduled to have a total knee replacement.  Over 30 years ago she had some redness and irritation from some ear rings.  She can wear nickel earrings without any problem.  She has not had any other reactions from metals.  She does not have a history of any other possible contact allergies.  She has not had eczema , asthma or chronic urticaria  Review of Systems  Constitutional: Negative.   HENT:       History of tonsillectomy .  Obstructive sleep apnea on CPAP  Eyes:       Early cataract in one eye  Respiratory: Negative.   Cardiovascular:       Hypertension.  History of a mild heart murmur  Gastrointestinal:       History of gastric banding and truncal vagotomy  Musculoskeletal:       Arthritis of the knees  Skin:       Over 30 years ago she had skin irritation from ear rings  Neurological:       Restless leg syndrome and spinal stenosis  Endo/Heme/Allergies:       Hypothyroidism.  No diabetes  Psychiatric/Behavioral: Negative.     Outpatient Encounter Medications as of 01/25/2019  Medication Sig  . Cholecalciferol (VITAMIN D) 2000 units tablet Take 2,000 Units by mouth daily.   Marland Kitchen diltiazem (CARTIA XT) 240 MG 24 hr capsule Take 1 capsule (240 mg total) by mouth daily.  Marland Kitchen FLUoxetine (PROZAC) 20 MG capsule Take 1 capsule (20 mg total) by mouth daily.  Marland Kitchen levothyroxine (SYNTHROID, LEVOTHROID) 175 MCG tablet Take 1 tablet (175 mcg total) by mouth daily before breakfast.  . losartan-hydrochlorothiazide (HYZAAR) 100-25 MG tablet Take 1 tablet by mouth daily.  . meloxicam  (MOBIC) 15 MG tablet Take 0.5-1 tablets (7.5-15 mg total) by mouth daily. (Patient taking differently: Take 15 mg by mouth daily. )  . rOPINIRole (REQUIP) 3 MG tablet Take 1 tablet (3 mg total) by mouth at bedtime.  . rosuvastatin (CRESTOR) 10 MG tablet Take 1 tablet (10 mg total) by mouth at bedtime.   No facility-administered encounter medications on file as of 01/25/2019.      Drug Allergies:  Allergies  Allergen Reactions  . Codeine Other (See Comments)    "makes pass out"  . Thimerosal Itching  . Nickel Rash    Family History: Veronica Finley's family history includes Arthritis in her brother, mother, and sister; Atrial fibrillation in her mother; Diabetes in her brother; Heart disease in her father; Hyperlipidemia in her brother and mother; Hypertension in her mother; Leukemia in her father..  Family history is negative for eczema, allergic rhinitis, asthma, angioedema, hives, food allergies.  Social and environmental.  There is a dog in the home.  She is not exposed to cigarette smoking.  She has never smoked cigarettes in the past.  She works in retail 2 days/week  Physical Exam: BP 128/82   Pulse 82   Temp 97.6 F (36.4 C) (Oral)   Resp 16   Ht 5' 5.7" (1.669 m)   Wt 247 lb  6.4 oz (112.2 kg)   SpO2 96%   BMI 40.30 kg/m    Physical Exam Vitals signs reviewed.  Constitutional:      Appearance: Normal appearance. She is obese.  HENT:     Head:     Comments: Eyes normal.  Ears normal.  Nose normal.  Pharynx normal. Neck:     Musculoskeletal: Neck supple.     Comments: No thyromegaly Cardiovascular:     Comments: S1 S2 normal no murmurs Lymphadenopathy:     Cervical: No cervical adenopathy.  Skin:    Comments: Clear  Neurological:     General: No focal deficit present.     Mental Status: She is alert and oriented to person, place, and time.  Psychiatric:        Mood and Affect: Mood normal.        Behavior: Behavior normal.        Thought Content: Thought content  normal.        Judgment: Judgment normal.     Diagnostics:  Patch testing to metals applied  Assessment  Assessment and Plan: 1. Contact dermatitis due to chemicals   2. Essential hypertension   3. Arthritis     No orders of the defined types were placed in this encounter.   Patient Instructions  Patch testing to metals applied Return in 48 hours for first reading Continuing your other medications Call us if you are not doing well on this treatment plan   Return in about 2 days (around 01/27/2019).   Thank you for the opportunity to care for this patient.  Please do not hesitate to contact me with questions.  Penne Lash, M.D.  [facility] [address] [place], [place] [address] [phone]

## 2019-01-26 ENCOUNTER — Encounter: Payer: Self-pay | Admitting: Family Medicine

## 2019-01-27 ENCOUNTER — Other Ambulatory Visit: Payer: Self-pay

## 2019-01-27 ENCOUNTER — Ambulatory Visit: Payer: Medicare Other | Admitting: Family Medicine

## 2019-01-27 ENCOUNTER — Encounter: Payer: Self-pay | Admitting: Family Medicine

## 2019-01-27 DIAGNOSIS — L253 Unspecified contact dermatitis due to other chemical products: Secondary | ICD-10-CM

## 2019-01-27 NOTE — Patient Instructions (Addendum)
  Veronica Finley returns to the office today for the initial patch test interpretation, given suspected history of contact dermatitis.    Diagnostics: Metals patch testing 48-hour hour reading: negative   Plan:   Allergic contact dermatitis - The patient has been provided detailed information regarding the substances she is sensitive to, as well as products containing the substances.   - Meticulous avoidance of these substances is recommended.  - If avoidance is not possible, the use of barrier creams or lotions is recommended. - If symptoms persist or progress, Dermatology Referral may be warranted.   Thank you for the opportunity to care for this patient.  Please do not hesitate to contact me with questions.  Gareth Morgan, FNP [facility]  Diagnostics: Metal patches 48 hour reading: negative including the paper clip.     Metals Patch - 01/08/19 1300     Time Antigen Placed      Manufacturer      Location     Number of Test     Reading Interval  Day 3    Chromium chloride 1%  0     Cobalt chloride hexahydrate 1%  0     Molybdenum chloride 0.5%  0     Nickel sulfate hexahydrate 5%  0     Potassium dichromate 0.25%  0     Copper sulfate pentahydrate 2%  0     Titanium 0.1%  0     Manganese chloride 0.5%  0     Tantal 1%  0    Vanadium pentoxide 10% 0   Aluminum hydroxide 10% 0    Nickel sulfate hexahydrate 5%  0     Comments  n    Follow up in 2 days or sooner if needed

## 2019-01-27 NOTE — Progress Notes (Addendum)
    Follow-up Note  RE: Veronica Finley MRN: [id number] DOB: 1951/11/17 Date of Office Visit: 01/27/2019  Primary care provider: Leamon Arnt, MD Referring provider: Leamon Arnt, MD   Inita returns to the office today for the initial patch test interpretation, given suspected history of contact dermatitis.    Diagnostics:   Metals patch testing 48-hour hour reading:   Diagnostics: Metal patches 48 hour reading: negative including the paper clip.     Metals Patch - 01/08/19 1300     Time Antigen Placed  01/25/2019 9:30    Manufacturer      Location Back    Number of Test 11    Reading Interval  Day    Chromium chloride 1%  0     Cobalt chloride hexahydrate 1%  0     Molybdenum chloride 0.5%  0     Nickel sulfate hexahydrate 5%  0     Potassium dichromate 0.25%  0     Copper sulfate pentahydrate 2%  0     Titanium 0.1%  0     Manganese chloride 0.5%  0     Tantal 1%  0    Vanadium pentoxide 10%  0   Aluminum hydroxide 10%  0    Nickel sulfate hexahydrate 5%  0     Comments  n     Plan:   Allergic contact dermatitis - The patient has been provided detailed information regarding the substances she is sensitive to, as well as products containing the substances.   - Meticulous avoidance of these substances is recommended.  - If avoidance is not possible, the use of barrier creams or lotions is recommended. - If symptoms persist or progress, Dermatology Referral may be warranted.  Follow up in 2 days or sooner if needed (Friday)  Thank you for the opportunity to care for this patient.  Please do not hesitate to contact me with questions.  Gareth Morgan, FNP [facility]  _________________________________________________  I have provided oversight concerning Veronica Finley's evaluation and treatment of this patient's health issues addressed during today's encounter.  I agree with the assessment and therapeutic  plan as outlined in the note.   Signed,   R Edgar Frisk, MD

## 2019-01-27 NOTE — Telephone Encounter (Signed)
I was calling the pt. To let her know that 8:30 am is the earliest we see pt.'s, but the pt. Stated she came in today for the 1st patch read and Friday will be the 2cd reading so Webb Silversmith told her she can just swing by the office on Monday at 8:30 for the last reading. Pt. Aware.

## 2019-01-29 ENCOUNTER — Ambulatory Visit: Payer: Medicare Other | Admitting: Family Medicine

## 2019-01-29 ENCOUNTER — Other Ambulatory Visit: Payer: Self-pay

## 2019-01-29 ENCOUNTER — Encounter: Payer: Self-pay | Admitting: Family Medicine

## 2019-01-29 DIAGNOSIS — L253 Unspecified contact dermatitis due to other chemical products: Secondary | ICD-10-CM

## 2019-01-29 NOTE — Patient Instructions (Signed)
Diagnostics: Metal patches 96 hour reading: negative including the paper clip.     Metals Patch - 01/08/19 1300     Time Antigen Placed  01/25/2019     Manufacturer      Location Back    Number of Test 11    Reading Interval  Day 5    Chromium chloride 1%  0     Cobalt chloride hexahydrate 1%  0     Molybdenum chloride 0.5%  0     Nickel sulfate hexahydrate 5%  0     Potassium dichromate 0.25%  0     Copper sulfate pentahydrate 2%  0     Titanium 0.1%  0     Manganese chloride 0.5%  0     Tantal 1%  0    Vanadium pentoxide 10%  0   Aluminum hydroxide 10%  0    Nickel sulfate hexahydrate 5%  0     Comments  n     Plan:   Allergic contact dermatitis - The patient has been provided detailed information regarding the substances she is sensitive to, as well as products containing the substances.   - Meticulous avoidance of these substances is recommended.  - If avoidance is not possible, the use of barrier creams or lotions is recommended. - If symptoms persist or progress, Dermatology Referral may be warranted.  Follow up on Monday, 02/01/2019 for the final reading

## 2019-01-29 NOTE — Progress Notes (Signed)
    Follow-up Note  RE: Veronica Finley MRN: [id number] DOB: January 01, 1951 Date of Office Visit: 01/29/2019  Primary care provider: Leamon Arnt, MD Referring provider: Leamon Arnt, MD   [name] returns to the office today for the initial patch test interpretation, given suspected history of contact dermatitis.   Diagnostics:   Diagnostics: Metal patches 96 hour reading: negative including the paper clip.     Metals Patch - 01/08/19 1300     Time Antigen Placed  01/25/2019     Manufacturer      Location Back    Number of Test 11    Reading Interval  Day 5    Chromium chloride 1%  0     Cobalt chloride hexahydrate 1%  0     Molybdenum chloride 0.5%  0     Nickel sulfate hexahydrate 5%  0     Potassium dichromate 0.25%  0     Copper sulfate pentahydrate 2%  0     Titanium 0.1%  0     Manganese chloride 0.5%  0     Tantal 1%  0    Vanadium pentoxide 10%  0   Aluminum hydroxide 10%  0    Nickel sulfate hexahydrate 5%  0     Comments  n     Plan:   Allergic contact dermatitis - The patient has been provided detailed information regarding the substances she is sensitive to, as well as products containing the substances.   - Meticulous avoidance of these substances is recommended.  - If avoidance is not possible, the use of barrier creams or lotions is recommended. - If symptoms persist or progress, Dermatology Referral may be warranted.  Follow up on Monday, 02/01/2019 for the final reading  Please call the clinic with any questions or if this treatment plan is not working well for you Thank you for the opportunity to care for this patient.  Please do not hesitate to contact me with questions.  Gareth Morgan, FNP [facility]

## 2019-02-01 ENCOUNTER — Other Ambulatory Visit: Payer: Self-pay

## 2019-02-01 ENCOUNTER — Encounter: Payer: Self-pay | Admitting: Family Medicine

## 2019-02-01 ENCOUNTER — Ambulatory Visit (INDEPENDENT_AMBULATORY_CARE_PROVIDER_SITE_OTHER): Payer: Medicare Other | Admitting: Family Medicine

## 2019-02-01 DIAGNOSIS — L253 Unspecified contact dermatitis due to other chemical products: Secondary | ICD-10-CM | POA: Diagnosis not present

## 2019-02-01 NOTE — Patient Instructions (Addendum)
Diagnostics: Metal patch reading is negative including the paper clip.     Metals Patch - 01/08/19 1300     Time Antigen Placed  01/25/2019    Manufacturer      Location Back    Number of Test  11    Reading Interval  Day 7    Chromium chloride 1%  0     Cobalt chloride hexahydrate 1%  0     Molybdenum chloride 0.5%  0     Nickel sulfate hexahydrate 5%  0     Potassium dichromate 0.25%  0     Copper sulfate pentahydrate 2%  0     Titanium 0.1%  0     Manganese chloride 0.5%  0     Tantal 1%  0    Vanadium pentoxide 10%  0   Aluminum hydroxide 10%  0   Paper clip  0    Nickel sulfate hexahydrate 5%  0     Comments  n     Plan:   Allergic contact dermatitis - The patient has been provided detailed information regarding the substances she is sensitive to, as well as products containing the substances.   - Meticulous avoidance of these substances is recommended.  - If avoidance is not possible, the use of barrier creams or lotions is recommended. - If symptoms persist or progress, Dermatology Referral may be warranted.  Please call the clinic with any questions.  Follow up as needed  Thank you for the opportunity to care for this patient.  Please do not hesitate to contact me with questions.  Gareth Morgan, FNP [facility]

## 2019-02-01 NOTE — Progress Notes (Addendum)
Follow-up Note  RE: Veronica Finley MRN: [id number] DOB: 1951/04/13 Date of Office Visit: 02/01/2019  Primary care provider: Leamon Arnt, MD Referring provider: Leamon Arnt, MD   Veronica Finley returns to the office today for the patch test interpretation, given suspected history of contact dermatitis.    Diagnostics:  Diagnostics: Metal patch reading was negative today including the paperclip.      Metals Patch - 01/08/19 1300     Time Antigen Placed  01/25/2019    Manufacturer      Location Back    Number of Test  11    Reading Interval  Day 7    Chromium chloride 1%  0     Cobalt chloride hexahydrate 1%  0     Molybdenum chloride 0.5%  0     Nickel sulfate hexahydrate 5%  0     Potassium dichromate 0.25%  0     Copper sulfate pentahydrate 2%  0     Titanium 0.1%  0     Manganese chloride 0.5%  0     Tantal 1%  0    Vanadium pentoxide 10%  0   Aluminum hydroxide 10%  0   Paper clip  0    Nickel sulfate hexahydrate 5%  0     Comments  n     Plan:   History allergic contact dermatitis - Patch tests to all metals tested are negative.  Please call the clinic with any questions.  Follow up as needed  Thank you for the opportunity to care for this patient.  Please do not hesitate to contact me with questions.  Gareth Morgan, FNP [facility]  _________________________________________________  I have provided oversight concerning Veronica Finley's evaluation and treatment of this patient's health issues addressed during today's encounter.  I agree with the assessment and therapeutic plan as outlined in the note.   Signed,   R Edgar Frisk, MD

## 2019-03-05 ENCOUNTER — Ambulatory Visit: Admit: 2019-03-05 | Payer: Medicare Other | Admitting: Specialist

## 2019-03-05 SURGERY — ARTHROPLASTY, KNEE, TOTAL
Anesthesia: Spinal | Laterality: Right

## 2019-03-29 NOTE — Progress Notes (Signed)
SPOKE W/ PT     SCREENING SYMPTOMS OF COVID 19:   COUGH--No  RUNNY NOSE--- No  SORE THROAT---No  NASAL CONGESTION----No  SNEEZING----No  SHORTNESS OF BREATH---No  DIFFICULTY BREATHING---No  TEMP >100.0 -----No  UNEXPLAINED BODY ACHES------No  CHILLS --------No   HEADACHES ---------No  LOSS OF SMELL/ TASTE --------No    HAVE YOU OR ANY FAMILY MEMBER TRAVELLED PAST 14 DAYS OUT OF THE   COUNTY---No STATE----No COUNTRY----No  HAVE YOU OR ANY FAMILY MEMBER BEEN EXPOSED TO ANYONE WITH COVID 19? No

## 2019-03-29 NOTE — H&P (View-Only) (Signed)
SPOKE W/  _     SCREENING SYMPTOMS OF COVID 19:   COUGH--  RUNNY NOSE---   SORE THROAT---  NASAL CONGESTION----  SNEEZING----  SHORTNESS OF BREATH---  DIFFICULTY BREATHING---  TEMP >100.0 -----  UNEXPLAINED BODY ACHES------  CHILLS --------   HEADACHES ---------  LOSS OF SMELL/ TASTE --------    HAVE YOU OR ANY FAMILY MEMBER TRAVELLED PAST 14 DAYS OUT OF THE   COUNTY--- STATE---- COUNTRY----  HAVE YOU OR ANY FAMILY MEMBER BEEN EXPOSED TO ANYONE WITH COVID 19?

## 2019-03-29 NOTE — H&P (Signed)
TOTAL KNEE ADMISSION H&P  Patient is being admitted for right total knee arthroplasty.  Subjective:  Chief Complaint:right knee pain.  HPI: Veronica Finley, 68 y.o. female, has a history of pain and functional disability in the right knee due to arthritis and has failed non-surgical conservative treatments for greater than 12 weeks to includeNSAID's and/or analgesics, viscosupplementation injections, supervised PT with diminished ADL's post treatment and weight reduction as appropriate.  Onset of symptoms was gradual, starting 2 years ago with gradually worsening course since that time. The patient noted no past surgery on the right knee(s).  Patient currently rates pain in the right knee(s) at 7 out of 10 with activity. Patient has worsening of pain with activity and weight bearing, pain with passive range of motion and joint swelling.  Patient has evidence of subchondral sclerosis, periarticular osteophytes and joint space narrowing by imaging studies. There is no active infection.  Patient Active Problem List   Diagnosis Date Noted  . Arthritis 01/25/2019  . Contact dermatitis due to chemicals 01/25/2019  . OSA on CPAP 10/26/2018  . Primary osteoarthritis of knees, bilateral 07/28/2018  . Generalized hyperhidrosis 07/15/2016  . Hx of laparoscopic gastric banding+ truncal vagotomy 03/24/2006 01/10/2014  . Lumbar spinal stenosis 07/08/2013  . Major depression, chronic 07/08/2013  . Essential hypertension 04/06/2012  . Acquired hypothyroidism 10/08/2011  . Mixed hyperlipidemia 10/08/2011  . AR (allergic rhinitis) 12/20/2010  . Restless legs syndrome (RLS) 12/20/2010  . Obesity (BMI 30-39.9) 12/17/2010  . Osteoarthritis, multiple sites 12/17/2010   Past Medical History:  Diagnosis Date  . Arthritis   . Depression   . Facial twitching   . Hyperlipidemia   . Hypertension   . Hypothyroidism   . Lumbar spinal stenosis 07/08/2013  . Murmur, cardiac    per cardiologist she saw before  having lap band surgery that she had a mild leaky valve and a mild murmur   . OSA on CPAP 10/26/2018    Past Surgical History:  Procedure Laterality Date  . GASTRIC BYPASS    . KNEE ARTHROSCOPY    . SPINE SURGERY    . TUBAL LIGATION    . UVULECTOMY      No current facility-administered medications for this encounter.    Current Outpatient Medications  Medication Sig Dispense Refill Last Dose  . Cholecalciferol (VITAMIN D) 2000 units tablet Take 2,000 Units by mouth daily.    Taking  . diltiazem (CARTIA XT) 240 MG 24 hr capsule Take 1 capsule (240 mg total) by mouth daily. 90 capsule 3 Taking  . FLUoxetine (PROZAC) 20 MG capsule Take 1 capsule (20 mg total) by mouth daily. 90 capsule 3 Taking  . ibuprofen (ADVIL) 200 MG tablet Take 200 mg by mouth daily as needed (knee pain).     Marland Kitchen levothyroxine (SYNTHROID, LEVOTHROID) 175 MCG tablet Take 1 tablet (175 mcg total) by mouth daily before breakfast. 90 tablet 3 Taking  . losartan-hydrochlorothiazide (HYZAAR) 100-25 MG tablet Take 1 tablet by mouth daily. 90 tablet 3 Taking  . meloxicam (MOBIC) 15 MG tablet Take 0.5-1 tablets (7.5-15 mg total) by mouth daily. (Patient taking differently: Take 15 mg by mouth daily. ) 90 tablet 1 Taking  . naproxen sodium (ALEVE) 220 MG tablet Take 220 mg by mouth daily as needed (knee pain).     Marland Kitchen rOPINIRole (REQUIP) 3 MG tablet Take 1 tablet (3 mg total) by mouth at bedtime. 90 tablet 3 Taking  . rosuvastatin (CRESTOR) 10 MG tablet Take 1 tablet (10  mg total) by mouth at bedtime. 90 tablet 1 Taking   Allergies  Allergen Reactions  . Codeine Other (See Comments)    "makes pass out"  . Thimerosal Itching    Social History   Tobacco Use  . Smoking status: Never Smoker  . Smokeless tobacco: Never Used  Substance Use Topics  . Alcohol use: Yes    Comment: rarely    Family History  Problem Relation Age of Onset  . Arthritis Mother   . Atrial fibrillation Mother   . Hyperlipidemia Mother   .  Hypertension Mother   . Leukemia Father   . Heart disease Father   . Arthritis Sister   . Arthritis Brother   . Diabetes Brother   . Hyperlipidemia Brother   . Allergic rhinitis Neg Hx   . Angioedema Neg Hx   . Asthma Neg Hx   . Eczema Neg Hx   . Immunodeficiency Neg Hx   . Urticaria Neg Hx      Review of Systems  Constitutional: Negative.   HENT: Negative.   Eyes: Negative.   Respiratory: Negative.   Cardiovascular: Negative.   Gastrointestinal: Negative.   Genitourinary: Negative.   Musculoskeletal: Positive for joint pain.  Skin: Negative.   Neurological: Negative.   Endo/Heme/Allergies: Negative.   Psychiatric/Behavioral: Negative.     Objective:  Physical Exam  Constitutional: She is oriented to person, place, and time. She appears well-developed.  HENT:  Head: Normocephalic.  Eyes: EOM are normal.  Neck: Normal range of motion.  Cardiovascular: Normal rate and intact distal pulses.  Respiratory: Effort normal.  GI: Soft.  Genitourinary:    Genitourinary Comments: deferred   Musculoskeletal:        General: Edema present.  Neurological: She is alert and oriented to person, place, and time. She has normal reflexes.  Skin: Skin is warm and dry.  Psychiatric: Her behavior is normal.    Vital signs in last 24 hours:    Labs:   Estimated body mass index is 40.3 kg/m as calculated from the following:   Height as of 01/25/19: 5' 5.7" (1.669 m).   Weight as of 01/25/19: 112.2 kg.   Imaging Review Plain radiographs demonstrate moderate degenerative joint disease of the right knee(s). The overall alignment ismild varus. The bone quality appears to be good for age and reported activity level.      Assessment/Plan:  End stage arthritis, right knee   The patient history, physical examination, clinical judgment of the provider and imaging studies are consistent with end stage degenerative joint disease of the right knee(s) and total knee arthroplasty is  deemed medically necessary. The treatment options including medical management, injection therapy arthroscopy and arthroplasty were discussed at length. The risks and benefits of total knee arthroplasty were presented and reviewed. The risks due to aseptic loosening, infection, stiffness, patella tracking problems, thromboembolic complications and other imponderables were discussed. The patient acknowledged the explanation, agreed to proceed with the plan and consent was signed. Patient is being admitted for inpatient treatment for surgery, pain control, PT, OT, prophylactic antibiotics, VTE prophylaxis, progressive ambulation and ADL's and discharge planning. The patient is planning to be discharged home with home health services    Anticipated LOS equal to or greater than 2 midnights due to - Age 4 and older with one or more of the following:  - Obesity  - Expected need for hospital services (PT, OT, Nursing) required for safe  discharge  - Anticipated need for postoperative skilled nursing  care or inpatient rehab  - Active co-morbidities: None OR   - Unanticipated findings during/Post Surgery: None  - Patient is a high risk of re-admission due to: None   Will use IV tranexamic acid. Contraindications and adverse affects of Tranexamic acid discussed in detail. Patient denies any of these at this time and understands the risks and benefits.

## 2019-03-29 NOTE — Progress Notes (Signed)
SPOKE W/  _     SCREENING SYMPTOMS OF COVID 19:   COUGH--  RUNNY NOSE---   SORE THROAT---  NASAL CONGESTION----  SNEEZING----  SHORTNESS OF BREATH---  DIFFICULTY BREATHING---  TEMP >100.0 -----  UNEXPLAINED BODY ACHES------  CHILLS --------   HEADACHES ---------  LOSS OF SMELL/ TASTE --------    HAVE YOU OR ANY FAMILY MEMBER TRAVELLED PAST 14 DAYS OUT OF THE   COUNTY--- STATE---- COUNTRY----  HAVE YOU OR ANY FAMILY MEMBER BEEN EXPOSED TO ANYONE WITH COVID 19?

## 2019-03-29 NOTE — Patient Instructions (Signed)
Veronica Finley  03/29/2019   Your procedure is scheduled on: 04-02-19    Report to [facility] Main  Entrance    Report to Short Stay at 5:30 AM   YOU NEED TO HAVE A COVID 19 TEST ON 03-30-19, THIS TEST MUST BE DONE BEFORE SURGERY, COME TO [facility] ENTRANCE BETWEEN THE HOURS OF 900 AM AND 300 PM ON YOUR COVID TEST DATE.   Call this number if you have problems the morning of surgery [phone]    Remember: NO SOLID FOOD AFTER MIDNIGHT THE NIGHT PRIOR TO SURGERY. NOTHING BY MOUTH EXCEPT CLEAR LIQUIDS UNTIL 3 HOURS PRIOR TO SCHEDULED SURGERY. PLEASE FINISH ENSURE DRINK PER SURGEON ORDER AT 4:30 AM.   CLEAR LIQUID DIET   Foods Allowed                                                                     Foods Excluded  Coffee and tea, regular and decaf                             liquids that you cannot  Plain Jell-O in any flavor                                             see through such as: Fruit ices (not with fruit pulp)                                     milk, soups, orange juice  Iced Popsicles                                    All solid food Carbonated beverages, regular and diet                                    Cranberry, grape and apple juices Sports drinks like Gatorade Lightly seasoned clear broth or consume(fat free) Sugar, honey syrup  Sample Menu Breakfast                                Lunch                                     Supper Cranberry juice                    Beef broth                            Chicken broth Jell-O  Grape juice                           Apple juice Coffee or tea                        Jell-O                                      Popsicle                                                Coffee or tea                        Coffee or tea  _____________________________________________________________________     BRUSH YOUR TEETH MORNING OF  SURGERY AND RINSE YOUR MOUTH OUT, NO CHEWING GUM CANDY OR MINTS.     Take these medicines the morning of surgery with A SIP OF WATER: Diltiazem (Cartia), Fluoxetine (Prozac), and Levothyroxine (Synthroid)                                You may not have any metal on your body including hair pins and              piercings     Do not wear jewelry, make-up, lotions, powders or perfumes, deodorant              Do not wear nail polish.  Do not shave  48 hours prior to surgery.  .   Do not bring valuables to the hospital. [place].  Contacts, dentures or bridgework may not be worn into surgery.                 Please read over the following fact sheets you were given: _____________________________________________________________________             [facility] - Preparing for Surgery Before surgery, you can play an important role.  Because skin is not sterile, your skin needs to be as free of germs as possible.  You can reduce the number of germs on your skin by washing with CHG (chlorahexidine gluconate) soap before surgery.  CHG is an antiseptic cleaner which kills germs and bonds with the skin to continue killing germs even after washing. Please DO NOT use if you have an allergy to CHG or antibacterial soaps.  If your skin becomes reddened/irritated stop using the CHG and inform your nurse when you arrive at Short Stay. Do not shave (including legs and underarms) for at least 48 hours prior to the first CHG shower.  You may shave your face/neck. Please follow these instructions carefully:  1.  Shower with CHG Soap the night before surgery and the  morning of Surgery.  2.  If you choose to wash your hair, wash your hair first as usual with your  normal  shampoo.  3.  After you shampoo, rinse your hair and body thoroughly to remove the  shampoo.  4.  Use CHG as you would any other liquid soap.  You can apply chg  directly  to the skin and wash                       Gently with a scrungie or clean washcloth.  5.  Apply the CHG Soap to your body ONLY FROM THE NECK DOWN.   Do not use on face/ open                           Wound or open sores. Avoid contact with eyes, ears mouth and genitals (private parts).                       Wash face,  Genitals (private parts) with your normal soap.             6.  Wash thoroughly, paying special attention to the area where your surgery  will be performed.  7.  Thoroughly rinse your body with warm water from the neck down.  8.  DO NOT shower/wash with your normal soap after using and rinsing off  the CHG Soap.                9.  Pat yourself dry with a clean towel.            10.  Wear clean pajamas.            11.  Place clean sheets on your bed the night of your first shower and do not  sleep with pets. Day of Surgery : Do not apply any lotions/deodorants the morning of surgery.  Please wear clean clothes to the hospital/surgery center.  FAILURE TO FOLLOW THESE INSTRUCTIONS MAY RESULT IN THE CANCELLATION OF YOUR SURGERY PATIENT SIGNATURE_________________________________  NURSE SIGNATURE__________________________________  ________________________________________________________________________

## 2019-03-30 ENCOUNTER — Other Ambulatory Visit (HOSPITAL_COMMUNITY)
Admission: RE | Admit: 2019-03-30 | Discharge: 2019-03-30 | Disposition: A | Discharge status: Home / Self Care | Payer: Medicare Other | Source: Ambulatory Visit

## 2019-03-30 ENCOUNTER — Encounter (HOSPITAL_COMMUNITY): Payer: Self-pay

## 2019-03-30 ENCOUNTER — Other Ambulatory Visit: Payer: Self-pay

## 2019-03-30 ENCOUNTER — Encounter (HOSPITAL_COMMUNITY)
Admission: RE | Admit: 2019-03-30 | Discharge: 2019-03-30 | Disposition: A | Discharge status: Home / Self Care | Payer: Medicare Other | Source: Ambulatory Visit | Attending: Specialist | Admitting: Specialist

## 2019-03-30 DIAGNOSIS — G2581 Restless legs syndrome: Secondary | ICD-10-CM | POA: Diagnosis not present

## 2019-03-30 DIAGNOSIS — G4733 Obstructive sleep apnea (adult) (pediatric): Secondary | ICD-10-CM | POA: Diagnosis not present

## 2019-03-30 DIAGNOSIS — E039 Hypothyroidism, unspecified: Secondary | ICD-10-CM | POA: Diagnosis not present

## 2019-03-30 DIAGNOSIS — E669 Obesity, unspecified: Secondary | ICD-10-CM | POA: Diagnosis not present

## 2019-03-30 DIAGNOSIS — Z79890 Hormone replacement therapy (postmenopausal): Secondary | ICD-10-CM | POA: Diagnosis not present

## 2019-03-30 DIAGNOSIS — Z79899 Other long term (current) drug therapy: Secondary | ICD-10-CM | POA: Diagnosis not present

## 2019-03-30 DIAGNOSIS — Z791 Long term (current) use of non-steroidal anti-inflammatories (NSAID): Secondary | ICD-10-CM | POA: Diagnosis not present

## 2019-03-30 DIAGNOSIS — Z01818 Encounter for other preprocedural examination: Secondary | ICD-10-CM | POA: Insufficient documentation

## 2019-03-30 DIAGNOSIS — I10 Essential (primary) hypertension: Secondary | ICD-10-CM | POA: Insufficient documentation

## 2019-03-30 DIAGNOSIS — F329 Major depressive disorder, single episode, unspecified: Secondary | ICD-10-CM | POA: Diagnosis not present

## 2019-03-30 DIAGNOSIS — Z9884 Bariatric surgery status: Secondary | ICD-10-CM | POA: Diagnosis not present

## 2019-03-30 DIAGNOSIS — E785 Hyperlipidemia, unspecified: Secondary | ICD-10-CM | POA: Diagnosis not present

## 2019-03-30 DIAGNOSIS — M1711 Unilateral primary osteoarthritis, right knee: Secondary | ICD-10-CM | POA: Insufficient documentation

## 2019-03-30 DIAGNOSIS — Z6841 Body Mass Index (BMI) 40.0 and over, adult: Secondary | ICD-10-CM | POA: Diagnosis not present

## 2019-03-30 DIAGNOSIS — R262 Difficulty in walking, not elsewhere classified: Secondary | ICD-10-CM | POA: Diagnosis not present

## 2019-03-30 DIAGNOSIS — Z1159 Encounter for screening for other viral diseases: Secondary | ICD-10-CM | POA: Diagnosis not present

## 2019-03-30 LAB — CBC
HCT: 44.3 % (ref 36.0–46.0)
Hemoglobin: 14.9 g/dL (ref 12.0–15.0)
MCH: 31 pg (ref 26.0–34.0)
MCHC: 33.6 g/dL (ref 30.0–36.0)
MCV: 92.1 fL (ref 80.0–100.0)
Platelets: 287 10*3/uL (ref 150–400)
RBC: 4.81 MIL/uL (ref 3.87–5.11)
RDW: 13 % (ref 11.5–15.5)
WBC: 8.5 10*3/uL (ref 4.0–10.5)
nRBC: 0 % (ref 0.0–0.2)

## 2019-03-30 LAB — BASIC METABOLIC PANEL
Anion gap: 11 (ref 5–15)
BUN: 18 mg/dL (ref 8–23)
CO2: 25 mmol/L (ref 22–32)
Calcium: 9.7 mg/dL (ref 8.9–10.3)
Chloride: 104 mmol/L (ref 98–111)
Creatinine, Ser: 0.99 mg/dL (ref 0.44–1.00)
GFR calc Af Amer: >60 mL/min (ref >60)
GFR calc non Af Amer: 60 mL/min — ABNORMAL LOW (ref >60)
Glucose, Bld: 112 mg/dL — ABNORMAL HIGH (ref 70–99)
Potassium: 4.1 mmol/L (ref 3.5–5.1)
Sodium: 140 mmol/L (ref 135–145)

## 2019-03-30 LAB — SURGICAL PCR SCREEN
MRSA, PCR: NEGATIVE
Staphylococcus aureus: NEGATIVE

## 2019-03-31 LAB — NOVEL CORONAVIRUS, NAA (HOSP ORDER, SEND-OUT TO REF LAB; TAT 18-24 HRS): SARS-CoV-2, NAA: NOT DETECTED

## 2019-04-01 MED ORDER — BUPIVACAINE LIPOSOME 1.3 % IJ SUSP
20.0000 mL | Freq: Once | INTRAMUSCULAR | Status: DC
  Filled 2019-04-01: qty 20

## 2019-04-01 NOTE — Anesthesia Preprocedure Evaluation (Addendum)
Anesthesia Evaluation  Patient identified by MRN, date of birth, ID band Patient awake    Reviewed: Allergy & Precautions, NPO status , Patient's Chart, lab work & pertinent test results  Airway Mallampati: II  TM Distance: >3 FB Neck ROM: Full    Dental no notable dental hx. (+) Teeth Intact   Pulmonary sleep apnea ,    Pulmonary exam normal breath sounds clear to auscultation       Cardiovascular Exercise Tolerance: Good hypertension, Pt. on medications Normal cardiovascular exam Rhythm:Regular Rate:Normal  03/30/19 EkG SR w 1 deg Av block R 70   Neuro/Psych Depression    GI/Hepatic   Endo/Other  Hypothyroidism   Renal/GU K+ 4.1     Musculoskeletal  (+) Arthritis ,   Abdominal   Peds  Hematology Hgb 14.9 Plt 287   Anesthesia Other Findings   Reproductive/Obstetrics                           Anesthesia Physical Anesthesia Plan  ASA: III  Anesthesia Plan: Regional and Spinal   Post-op Pain Management:  Regional for Post-op pain   Induction:   PONV Risk Score and Plan:   Airway Management Planned: Simple Face Mask and Natural Airway  Additional Equipment:   Intra-op Plan:   Post-operative Plan:   Informed Consent: I have reviewed the patients History and Physical, chart, labs and discussed the procedure including the risks, benefits and alternatives for the proposed anesthesia with the patient or authorized representative who has indicated his/her understanding and acceptance.     Dental advisory given  Plan Discussed with:   Anesthesia Plan Comments: (R TKA under spinal w R Adductor canal block)       Anesthesia Quick Evaluation

## 2019-04-02 ENCOUNTER — Other Ambulatory Visit: Payer: Self-pay

## 2019-04-02 ENCOUNTER — Ambulatory Visit (HOSPITAL_COMMUNITY): Payer: Medicare Other | Admitting: Anesthesiology

## 2019-04-02 ENCOUNTER — Encounter (HOSPITAL_COMMUNITY)
Admission: RE | Disposition: A | Discharge status: Home Health | Payer: Self-pay | Source: Home / Self Care | Attending: Specialist

## 2019-04-02 ENCOUNTER — Ambulatory Visit (HOSPITAL_COMMUNITY): Payer: Medicare Other | Admitting: Physician Assistant

## 2019-04-02 ENCOUNTER — Observation Stay (HOSPITAL_COMMUNITY)
Admission: RE | Admit: 2019-04-02 | Discharge: 2019-04-04 | Disposition: A | Discharge status: Home Health | Payer: Medicare Other | Attending: Specialist | Admitting: Specialist

## 2019-04-02 ENCOUNTER — Encounter (HOSPITAL_COMMUNITY): Payer: Self-pay | Admitting: *Deleted

## 2019-04-02 DIAGNOSIS — E669 Obesity, unspecified: Secondary | ICD-10-CM | POA: Insufficient documentation

## 2019-04-02 DIAGNOSIS — F329 Major depressive disorder, single episode, unspecified: Secondary | ICD-10-CM | POA: Insufficient documentation

## 2019-04-02 DIAGNOSIS — Z96659 Presence of unspecified artificial knee joint: Secondary | ICD-10-CM

## 2019-04-02 DIAGNOSIS — Z1159 Encounter for screening for other viral diseases: Secondary | ICD-10-CM | POA: Insufficient documentation

## 2019-04-02 DIAGNOSIS — E785 Hyperlipidemia, unspecified: Secondary | ICD-10-CM | POA: Insufficient documentation

## 2019-04-02 DIAGNOSIS — I10 Essential (primary) hypertension: Secondary | ICD-10-CM | POA: Insufficient documentation

## 2019-04-02 DIAGNOSIS — Z79899 Other long term (current) drug therapy: Secondary | ICD-10-CM | POA: Insufficient documentation

## 2019-04-02 DIAGNOSIS — E039 Hypothyroidism, unspecified: Secondary | ICD-10-CM | POA: Insufficient documentation

## 2019-04-02 DIAGNOSIS — M1711 Unilateral primary osteoarthritis, right knee: Principal | ICD-10-CM

## 2019-04-02 DIAGNOSIS — G4733 Obstructive sleep apnea (adult) (pediatric): Secondary | ICD-10-CM | POA: Diagnosis not present

## 2019-04-02 DIAGNOSIS — Z791 Long term (current) use of non-steroidal anti-inflammatories (NSAID): Secondary | ICD-10-CM | POA: Insufficient documentation

## 2019-04-02 DIAGNOSIS — Z6841 Body Mass Index (BMI) 40.0 and over, adult: Secondary | ICD-10-CM | POA: Insufficient documentation

## 2019-04-02 DIAGNOSIS — Z79890 Hormone replacement therapy (postmenopausal): Secondary | ICD-10-CM | POA: Insufficient documentation

## 2019-04-02 DIAGNOSIS — Z9884 Bariatric surgery status: Secondary | ICD-10-CM | POA: Insufficient documentation

## 2019-04-02 DIAGNOSIS — R262 Difficulty in walking, not elsewhere classified: Secondary | ICD-10-CM | POA: Diagnosis not present

## 2019-04-02 DIAGNOSIS — G2581 Restless legs syndrome: Secondary | ICD-10-CM | POA: Insufficient documentation

## 2019-04-02 HISTORY — PX: TOTAL KNEE ARTHROPLASTY: SHX125

## 2019-04-02 SURGERY — ARTHROPLASTY, KNEE, TOTAL
Anesthesia: Regional | Site: Knee | Laterality: Right

## 2019-04-02 MED ORDER — HYDROCHLOROTHIAZIDE 25 MG PO TABS
25.0000 mg | ORAL_TABLET | Freq: Every day | ORAL | Status: DC
  Administered 2019-04-03 – 2019-04-04 (×2): 25 mg via ORAL
  Filled 2019-04-02 (×2): qty 1

## 2019-04-02 MED ORDER — FENTANYL CITRATE (PF) 100 MCG/2ML IJ SOLN
INTRAMUSCULAR | Status: DC | PRN
  Administered 2019-04-02 (×2): 50 ug via INTRAVENOUS

## 2019-04-02 MED ORDER — PROPOFOL 10 MG/ML IV BOLUS
INTRAVENOUS | Status: AC
  Filled 2019-04-02: qty 60

## 2019-04-02 MED ORDER — SODIUM CHLORIDE (PF) 0.9 % IJ SOLN
INTRAMUSCULAR | Status: AC
  Filled 2019-04-02: qty 20

## 2019-04-02 MED ORDER — TRANEXAMIC ACID-NACL 1000-0.7 MG/100ML-% IV SOLN
1000.0000 mg | INTRAVENOUS | Status: AC
  Administered 2019-04-02: 1000 mg via INTRAVENOUS
  Filled 2019-04-02: qty 100

## 2019-04-02 MED ORDER — PHENYLEPHRINE HCL (PRESSORS) 10 MG/ML IV SOLN
INTRAVENOUS | Status: DC | PRN
  Administered 2019-04-02 (×2): 120 ug via INTRAVENOUS

## 2019-04-02 MED ORDER — ACETAMINOPHEN 10 MG/ML IV SOLN: 1000.0000 mg | Freq: Once | INTRAVENOUS | Status: DC | PRN

## 2019-04-02 MED ORDER — MIDAZOLAM HCL 2 MG/2ML IJ SOLN
INTRAMUSCULAR | Status: AC
  Filled 2019-04-02: qty 2

## 2019-04-02 MED ORDER — ONDANSETRON HCL 4 MG/2ML IJ SOLN: 4.0000 mg | Freq: Once | INTRAMUSCULAR | Status: DC | PRN

## 2019-04-02 MED ORDER — BUPIVACAINE LIPOSOME 1.3 % IJ SUSP
INTRAMUSCULAR | Status: DC | PRN
  Administered 2019-04-02: 20 mL

## 2019-04-02 MED ORDER — DILTIAZEM HCL ER COATED BEADS 240 MG PO CP24
240.0000 mg | ORAL_CAPSULE | Freq: Every day | ORAL | Status: DC
  Administered 2019-04-03 – 2019-04-04 (×2): 240 mg via ORAL
  Filled 2019-04-02 (×2): qty 1

## 2019-04-02 MED ORDER — PROPOFOL 10 MG/ML IV BOLUS: INTRAVENOUS | Status: DC | PRN

## 2019-04-02 MED ORDER — BISACODYL 5 MG PO TBEC: 5.0000 mg | DELAYED_RELEASE_TABLET | Freq: Every day | ORAL | Status: DC | PRN

## 2019-04-02 MED ORDER — PROPOFOL 10 MG/ML IV BOLUS
INTRAVENOUS | Status: AC
  Filled 2019-04-02: qty 20

## 2019-04-02 MED ORDER — ROSUVASTATIN CALCIUM 10 MG PO TABS
10.0000 mg | ORAL_TABLET | Freq: Every day | ORAL | Status: DC
  Administered 2019-04-02 – 2019-04-03 (×2): 10 mg via ORAL
  Filled 2019-04-02 (×3): qty 1

## 2019-04-02 MED ORDER — LOSARTAN POTASSIUM-HCTZ 100-25 MG PO TABS: 1.0000 | ORAL_TABLET | Freq: Every day | ORAL | Status: DC

## 2019-04-02 MED ORDER — LACTATED RINGERS IV SOLN
INTRAVENOUS | Status: DC
  Administered 2019-04-02 (×3): via INTRAVENOUS

## 2019-04-02 MED ORDER — MIDAZOLAM HCL 2 MG/2ML IJ SOLN
2.0000 mg | INTRAMUSCULAR | Status: DC
  Filled 2019-04-02 (×2): qty 2

## 2019-04-02 MED ORDER — OXYCODONE HCL 5 MG PO TABS: 5.0000 mg | ORAL_TABLET | ORAL | 0 refills | Status: AC | PRN

## 2019-04-02 MED ORDER — ENOXAPARIN SODIUM 30 MG/0.3ML ~~LOC~~ SOLN
30.0000 mg | Freq: Two times a day (BID) | SUBCUTANEOUS | Status: DC
  Administered 2019-04-03 – 2019-04-04 (×3): 30 mg via SUBCUTANEOUS
  Filled 2019-04-02 (×3): qty 0.3

## 2019-04-02 MED ORDER — FENTANYL CITRATE (PF) 100 MCG/2ML IJ SOLN
INTRAMUSCULAR | Status: AC
  Filled 2019-04-02: qty 2

## 2019-04-02 MED ORDER — SODIUM CHLORIDE (PF) 0.9 % IJ SOLN
INTRAMUSCULAR | Status: AC
  Filled 2019-04-02: qty 50

## 2019-04-02 MED ORDER — POLYETHYLENE GLYCOL 3350 17 G PO PACK
17.0000 g | PACK | Freq: Every day | ORAL | Status: DC | PRN
  Administered 2019-04-04: 17 g via ORAL
  Filled 2019-04-02: qty 1

## 2019-04-02 MED ORDER — CHLORHEXIDINE GLUCONATE 4 % EX LIQD: 60.0000 mL | Freq: Once | CUTANEOUS | Status: DC

## 2019-04-02 MED ORDER — LOSARTAN POTASSIUM 50 MG PO TABS
100.0000 mg | ORAL_TABLET | Freq: Every day | ORAL | Status: DC
  Administered 2019-04-03 – 2019-04-04 (×2): 100 mg via ORAL
  Filled 2019-04-02 (×3): qty 2

## 2019-04-02 MED ORDER — CEFAZOLIN SODIUM-DEXTROSE 2-4 GM/100ML-% IV SOLN
2.0000 g | INTRAVENOUS | Status: AC
  Administered 2019-04-02: 2 g via INTRAVENOUS
  Filled 2019-04-02: qty 100

## 2019-04-02 MED ORDER — FENTANYL CITRATE (PF) 100 MCG/2ML IJ SOLN
25.0000 ug | INTRAMUSCULAR | Status: DC | PRN
  Administered 2019-04-02 (×2): 50 ug via INTRAVENOUS

## 2019-04-02 MED ORDER — LEVOTHYROXINE SODIUM 75 MCG PO TABS
175.0000 ug | ORAL_TABLET | Freq: Every day | ORAL | Status: DC
  Administered 2019-04-03 – 2019-04-04 (×2): 175 ug via ORAL
  Filled 2019-04-02: qty 1
  Filled 2019-04-02: qty 2
  Filled 2019-04-02: qty 1

## 2019-04-02 MED ORDER — DEXAMETHASONE SODIUM PHOSPHATE 10 MG/ML IJ SOLN
10.0000 mg | Freq: Once | INTRAMUSCULAR | Status: AC
  Administered 2019-04-02: 10 mg via INTRAVENOUS

## 2019-04-02 MED ORDER — METHOCARBAMOL 500 MG IVPB - SIMPLE MED
INTRAVENOUS | Status: AC
  Filled 2019-04-02: qty 50

## 2019-04-02 MED ORDER — METHOCARBAMOL 500 MG PO TABS: 500.0000 mg | ORAL_TABLET | Freq: Three times a day (TID) | ORAL | 1 refills | Status: DC | PRN

## 2019-04-02 MED ORDER — MAGNESIUM CITRATE PO SOLN: 1.0000 | Freq: Once | ORAL | Status: DC | PRN

## 2019-04-02 MED ORDER — ONDANSETRON HCL 4 MG/2ML IJ SOLN: 4.0000 mg | Freq: Four times a day (QID) | INTRAMUSCULAR | Status: DC | PRN

## 2019-04-02 MED ORDER — OXYCODONE HCL 5 MG PO TABS
5.0000 mg | ORAL_TABLET | ORAL | Status: DC | PRN
  Administered 2019-04-02 (×3): 10 mg via ORAL
  Administered 2019-04-03 (×3): 5 mg via ORAL
  Filled 2019-04-02: qty 2
  Filled 2019-04-02 (×3): qty 1
  Filled 2019-04-02 (×2): qty 2

## 2019-04-02 MED ORDER — MIDAZOLAM HCL 5 MG/5ML IJ SOLN
INTRAMUSCULAR | Status: DC | PRN
  Administered 2019-04-02 (×2): 1 mg via INTRAVENOUS

## 2019-04-02 MED ORDER — OXYCODONE HCL 5 MG PO TABS
10.0000 mg | ORAL_TABLET | ORAL | Status: DC | PRN
  Administered 2019-04-03 – 2019-04-04 (×4): 10 mg via ORAL
  Filled 2019-04-02 (×4): qty 2

## 2019-04-02 MED ORDER — METHOCARBAMOL 500 MG PO TABS
500.0000 mg | ORAL_TABLET | Freq: Four times a day (QID) | ORAL | Status: DC | PRN
  Administered 2019-04-03 – 2019-04-04 (×3): 500 mg via ORAL
  Filled 2019-04-02 (×3): qty 1

## 2019-04-02 MED ORDER — 0.9 % SODIUM CHLORIDE (POUR BTL) OPTIME
TOPICAL | Status: DC | PRN
  Administered 2019-04-02: 08:00:00 2000 mL

## 2019-04-02 MED ORDER — METHOCARBAMOL 500 MG IVPB - SIMPLE MED
500.0000 mg | Freq: Four times a day (QID) | INTRAVENOUS | Status: DC | PRN
  Administered 2019-04-02: 500 mg via INTRAVENOUS
  Filled 2019-04-02: qty 50

## 2019-04-02 MED ORDER — ALUM & MAG HYDROXIDE-SIMETH 200-200-20 MG/5ML PO SUSP: 30.0000 mL | ORAL | Status: DC | PRN

## 2019-04-02 MED ORDER — ROPINIROLE HCL 1 MG PO TABS
3.0000 mg | ORAL_TABLET | Freq: Every day | ORAL | Status: DC
  Administered 2019-04-02 – 2019-04-03 (×2): 3 mg via ORAL
  Filled 2019-04-02 (×2): qty 3

## 2019-04-02 MED ORDER — CLONIDINE HCL (ANALGESIA) 100 MCG/ML EP SOLN
EPIDURAL | Status: DC | PRN
  Administered 2019-04-02: 100 ug

## 2019-04-02 MED ORDER — FERROUS SULFATE 325 (65 FE) MG PO TABS
325.0000 mg | ORAL_TABLET | Freq: Three times a day (TID) | ORAL | Status: DC
  Administered 2019-04-03 – 2019-04-04 (×4): 325 mg via ORAL
  Filled 2019-04-02 (×5): qty 1

## 2019-04-02 MED ORDER — SODIUM CHLORIDE (PF) 0.9 % IJ SOLN
INTRAMUSCULAR | Status: DC | PRN
  Administered 2019-04-02: 60 mL

## 2019-04-02 MED ORDER — SODIUM CHLORIDE 0.9 % IV SOLN
INTRAVENOUS | Status: DC
  Administered 2019-04-02: 75 mL/h via INTRAVENOUS
  Administered 2019-04-03: 02:00:00 via INTRAVENOUS

## 2019-04-02 MED ORDER — ROPIVACAINE HCL 5 MG/ML IJ SOLN
INTRAMUSCULAR | Status: DC | PRN
  Administered 2019-04-02: 30 mL via PERINEURAL

## 2019-04-02 MED ORDER — METOCLOPRAMIDE HCL 5 MG/ML IJ SOLN: 5.0000 mg | Freq: Three times a day (TID) | INTRAMUSCULAR | Status: DC | PRN

## 2019-04-02 MED ORDER — ACETAMINOPHEN 500 MG PO TABS
1000.0000 mg | ORAL_TABLET | Freq: Four times a day (QID) | ORAL | Status: AC
  Administered 2019-04-02 – 2019-04-03 (×3): 1000 mg via ORAL
  Filled 2019-04-02 (×4): qty 2

## 2019-04-02 MED ORDER — FENTANYL CITRATE (PF) 100 MCG/2ML IJ SOLN
100.0000 ug | INTRAMUSCULAR | Status: DC
  Filled 2019-04-02 (×2): qty 2

## 2019-04-02 MED ORDER — ONDANSETRON HCL 4 MG/2ML IJ SOLN
INTRAMUSCULAR | Status: AC
  Filled 2019-04-02: qty 2

## 2019-04-02 MED ORDER — DEXAMETHASONE SODIUM PHOSPHATE 10 MG/ML IJ SOLN
10.0000 mg | Freq: Once | INTRAMUSCULAR | Status: AC
  Administered 2019-04-03: 10 mg via INTRAVENOUS
  Filled 2019-04-02: qty 1

## 2019-04-02 MED ORDER — FLUOXETINE HCL 20 MG PO CAPS
20.0000 mg | ORAL_CAPSULE | Freq: Every day | ORAL | Status: DC
  Administered 2019-04-03 – 2019-04-04 (×2): 20 mg via ORAL
  Filled 2019-04-02 (×2): qty 1

## 2019-04-02 MED ORDER — STERILE WATER FOR IRRIGATION IR SOLN
Status: DC | PRN
  Administered 2019-04-02: 1000 mL

## 2019-04-02 MED ORDER — ONDANSETRON HCL 4 MG/2ML IJ SOLN
INTRAMUSCULAR | Status: DC | PRN
  Administered 2019-04-02: 4 mg via INTRAVENOUS

## 2019-04-02 MED ORDER — SODIUM CHLORIDE 0.9 % IR SOLN
Status: DC | PRN
  Administered 2019-04-02: 1000 mL

## 2019-04-02 MED ORDER — ASPIRIN EC 325 MG PO TBEC: 325.0000 mg | DELAYED_RELEASE_TABLET | Freq: Two times a day (BID) | ORAL | 0 refills | Status: DC

## 2019-04-02 MED ORDER — DEXAMETHASONE SODIUM PHOSPHATE 10 MG/ML IJ SOLN
INTRAMUSCULAR | Status: AC
  Filled 2019-04-02: qty 1

## 2019-04-02 MED ORDER — METOCLOPRAMIDE HCL 5 MG PO TABS: 5.0000 mg | ORAL_TABLET | Freq: Three times a day (TID) | ORAL | Status: DC | PRN

## 2019-04-02 MED ORDER — DIPHENHYDRAMINE HCL 12.5 MG/5ML PO ELIX: 12.5000 mg | ORAL_SOLUTION | ORAL | Status: DC | PRN

## 2019-04-02 MED ORDER — DOCUSATE SODIUM 100 MG PO CAPS
100.0000 mg | ORAL_CAPSULE | Freq: Two times a day (BID) | ORAL | Status: DC
  Administered 2019-04-02 – 2019-04-04 (×4): 100 mg via ORAL
  Filled 2019-04-02 (×4): qty 1

## 2019-04-02 MED ORDER — CEFAZOLIN SODIUM-DEXTROSE 2-4 GM/100ML-% IV SOLN
2.0000 g | Freq: Four times a day (QID) | INTRAVENOUS | Status: AC
  Administered 2019-04-02 (×2): 2 g via INTRAVENOUS
  Filled 2019-04-02 (×2): qty 100

## 2019-04-02 MED ORDER — POVIDONE-IODINE 10 % EX SWAB
2.0000 "application " | Freq: Once | CUTANEOUS | Status: AC
  Administered 2019-04-02: 2 via TOPICAL

## 2019-04-02 MED ORDER — HYDROMORPHONE HCL 1 MG/ML IJ SOLN: 0.5000 mg | INTRAMUSCULAR | Status: DC | PRN

## 2019-04-02 MED ORDER — MENTHOL 3 MG MT LOZG: 1.0000 | LOZENGE | OROMUCOSAL | Status: DC | PRN

## 2019-04-02 MED ORDER — ACETAMINOPHEN 325 MG PO TABS
325.0000 mg | ORAL_TABLET | Freq: Four times a day (QID) | ORAL | Status: DC | PRN
  Administered 2019-04-03 – 2019-04-04 (×2): 650 mg via ORAL
  Filled 2019-04-02 (×2): qty 2

## 2019-04-02 MED ORDER — PHENOL 1.4 % MT LIQD: 1.0000 | OROMUCOSAL | Status: DC | PRN

## 2019-04-02 MED ORDER — ONDANSETRON HCL 4 MG PO TABS: 4.0000 mg | ORAL_TABLET | Freq: Four times a day (QID) | ORAL | Status: DC | PRN

## 2019-04-02 MED ORDER — BUPIVACAINE IN DEXTROSE 0.75-8.25 % IT SOLN
INTRATHECAL | Status: DC | PRN
  Administered 2019-04-02: 1.8 mL via INTRATHECAL

## 2019-04-02 MED ORDER — SODIUM CHLORIDE 0.9 % IV SOLN
INTRAVENOUS | Status: DC | PRN
  Administered 2019-04-02: 30 ug/min via INTRAVENOUS

## 2019-04-02 MED ORDER — PROPOFOL 500 MG/50ML IV EMUL
INTRAVENOUS | Status: DC | PRN
  Administered 2019-04-02: 50 ug/kg/min via INTRAVENOUS

## 2019-04-02 SURGICAL SUPPLY — 70 items
ADH SKN CLS APL DERMABOND .7 (GAUZE/BANDAGES/DRESSINGS) ×1
APL PRP STRL LF DISP 70% ISPRP (MISCELLANEOUS) ×2
ATTUNE PS FEM RT SZ 7 CEM KNEE (Femur) ×1 IMPLANT
ATTUNE PSRP INSR SZ7 10 KNEE (Insert) ×1 IMPLANT
BAG DECANTER FOR FLEXI CONT (MISCELLANEOUS) IMPLANT
BAG SPEC THK2 15X12 ZIP CLS (MISCELLANEOUS) ×2
BAG ZIPLOCK 12X15 (MISCELLANEOUS) ×4 IMPLANT
BANDAGE ACE 4X5 VEL STRL LF (GAUZE/BANDAGES/DRESSINGS) ×2 IMPLANT
BANDAGE ACE 6X5 VEL STRL LF (GAUZE/BANDAGES/DRESSINGS) ×2 IMPLANT
BASE TIBIA ATTUNE KNEE SYS SZ6 (Knees) IMPLANT
BLADE SAG 18X100X1.27 (BLADE) ×2 IMPLANT
BLADE SAW SGTL 11.0X1.19X90.0M (BLADE) ×2 IMPLANT
BOWL SMART MIX CTS (DISPOSABLE) ×2 IMPLANT
BSPLAT TIB 6 CMNT ROT PLAT STR (Knees) ×1 IMPLANT
CEMENT HV SMART SET (Cement) ×2 IMPLANT
CHLORAPREP W/TINT 26 (MISCELLANEOUS) ×4 IMPLANT
COVER SURGICAL LIGHT HANDLE (MISCELLANEOUS) ×2 IMPLANT
COVER WAND RF STERILE (DRAPES) IMPLANT
CUFF TOURN SGL QUICK 34 (TOURNIQUET CUFF) ×2
CUFF TRNQT CYL 34X4.125X (TOURNIQUET CUFF) ×1 IMPLANT
DECANTER SPIKE VIAL GLASS SM (MISCELLANEOUS) ×2 IMPLANT
DERMABOND ADVANCED (GAUZE/BANDAGES/DRESSINGS) ×1
DERMABOND ADVANCED .7 DNX12 (GAUZE/BANDAGES/DRESSINGS) ×1 IMPLANT
DRAPE U-SHAPE 47X51 STRL (DRAPES) ×2 IMPLANT
DRSG AQUACEL AG ADV 3.5X10 (GAUZE/BANDAGES/DRESSINGS) ×2 IMPLANT
DRSG TEGADERM 4X4.75 (GAUZE/BANDAGES/DRESSINGS) ×2 IMPLANT
ELECT REM PT RETURN 15FT ADLT (MISCELLANEOUS) ×2 IMPLANT
EVACUATOR 1/8 PVC DRAIN (DRAIN) ×2 IMPLANT
GAUZE SPONGE 2X2 8PLY STRL LF (GAUZE/BANDAGES/DRESSINGS) ×1 IMPLANT
GLOVE BIO SURGEON STRL SZ7 (GLOVE) ×1 IMPLANT
GLOVE BIO SURGEON STRL SZ7.5 (GLOVE) ×4 IMPLANT
GLOVE BIOGEL PI IND STRL 7.0 (GLOVE) IMPLANT
GLOVE BIOGEL PI IND STRL 8 (GLOVE) ×2 IMPLANT
GLOVE BIOGEL PI INDICATOR 7.0 (GLOVE) ×3
GLOVE BIOGEL PI INDICATOR 8 (GLOVE) ×2
GLOVE ECLIPSE 8.0 STRL XLNG CF (GLOVE) ×4 IMPLANT
GLOVE SURG ORTHO 9.0 STRL STRW (GLOVE) ×2 IMPLANT
GLOVE SURG SS PI 6.5 STRL IVOR (GLOVE) ×1 IMPLANT
GLOVE SURG SS PI 7.0 STRL IVOR (GLOVE) ×1 IMPLANT
GOWN STRL REUS W/ TWL LRG LVL3 (GOWN DISPOSABLE) IMPLANT
GOWN STRL REUS W/TWL LRG LVL3 (GOWN DISPOSABLE) ×4
GOWN STRL REUS W/TWL XL LVL3 (GOWN DISPOSABLE) ×4 IMPLANT
HANDPIECE INTERPULSE COAX TIP (DISPOSABLE) ×2
HOLDER FOLEY CATH W/STRAP (MISCELLANEOUS) IMPLANT
KIT TURNOVER KIT A (KITS) IMPLANT
PACK TOTAL KNEE CUSTOM (KITS) ×2 IMPLANT
PATELLA MEDIAL ATTUN 35MM KNEE (Knees) ×1 IMPLANT
PIN STEINMAN FIXATION KNEE (PIN) ×1 IMPLANT
PIN THREADED HEADED SIGMA (PIN) ×1 IMPLANT
PROTECTOR NERVE ULNAR (MISCELLANEOUS) ×2 IMPLANT
SET HNDPC FAN SPRY TIP SCT (DISPOSABLE) ×1 IMPLANT
SET PAD KNEE POSITIONER (MISCELLANEOUS) ×2 IMPLANT
SPONGE GAUZE 2X2 STER 10/PKG (GAUZE/BANDAGES/DRESSINGS) ×1
SPONGE LAP 18X18 RF (DISPOSABLE) IMPLANT
SPONGE SURGIFOAM ABS GEL 100 (HEMOSTASIS) ×2 IMPLANT
STOCKINETTE 6  STRL (DRAPES) ×1
STOCKINETTE 6 STRL (DRAPES) ×1 IMPLANT
SUT BONE WAX W31G (SUTURE) IMPLANT
SUT MNCRL AB 3-0 PS2 18 (SUTURE) ×2 IMPLANT
SUT VIC AB 1 CT1 27 (SUTURE) ×6
SUT VIC AB 1 CT1 27XBRD ANTBC (SUTURE) ×3 IMPLANT
SUT VIC AB 2-0 CT1 27 (SUTURE) ×4
SUT VIC AB 2-0 CT1 TAPERPNT 27 (SUTURE) ×2 IMPLANT
SUT VLOC 180 0 24IN GS25 (SUTURE) ×2 IMPLANT
SYR 50ML LL SCALE MARK (SYRINGE) IMPLANT
TAPE STRIPS DRAPE STRL (GAUZE/BANDAGES/DRESSINGS) ×2 IMPLANT
TIBIA ATTUNE KNEE SYS BASE SZ6 (Knees) ×2 IMPLANT
TRAY FOLEY MTR SLVR 14FR STAT (SET/KITS/TRAYS/PACK) ×1 IMPLANT
WRAP KNEE MAXI GEL POST OP (GAUZE/BANDAGES/DRESSINGS) ×2 IMPLANT
YANKAUER SUCT BULB TIP 10FT TU (MISCELLANEOUS) ×2 IMPLANT

## 2019-04-02 NOTE — Anesthesia Procedure Notes (Signed)
Procedure Name: MAC Date/Time: 04/02/2019 7:39 AM Performed by: Lissa Morales, CRNA Pre-anesthesia Checklist: Patient identified, Emergency Drugs available, Suction available and Patient being monitored Patient Re-evaluated:Patient Re-evaluated prior to induction Oxygen Delivery Method: Simple face mask Placement Confirmation: positive ETCO2

## 2019-04-02 NOTE — Op Note (Signed)
DATE OF SURGERY:  04/02/2019  TIME: 9:47 AM  PATIENT NAME:  Veronica Finley    AGE: 68 y.o.   PRE-OPERATIVE DIAGNOSIS:  right knee osteoarthritis  POST-OPERATIVE DIAGNOSIS:  right knee osteoarthritis  PROCEDURE:  Procedure(s): TOTAL KNEE ARTHROPLASTY  SURGEON:  Sharlotte Baka ANDREW  ASSISTANT:  Bryson Stilwell, PA-C, present and scrubbed throughout the case, critical for assistance with exposure, retraction, instrumentation, and closure.  OPERATIVE IMPLANTS: Depuy PFC Attune Rotating Platform.  Femur size 7, Tibia size 6, Patella size 35 3-peg oval button, with a 10 mm polyethylene insert.   PREOPERATIVE INDICATIONS:   Veronica Finley is a 68 y.o. year old female with end stage bone on bone arthritis of the knee who failed conservative treatment and elected for Total Knee Arthroplasty.   The risks, benefits, and alternatives were discussed at length including but not limited to the risks of infection, bleeding, nerve injury, stiffness, blood clots, the need for revision surgery, cardiopulmonary complications, among others, and they were willing to proceed.  OPERATIVE DESCRIPTION:  The patient was brought to the operative room and placed in a supine position.  Spinal anesthesia was administered.  IV antibiotics were given.  The lower extremity was prepped and draped in the usual sterile fashion.  Time out was performed.  The leg was elevated and exsanguinated and the tourniquet was inflated.  Anterior quadriceps tendon splitting approach was performed.  The patella was retracted and osteophytes were removed.  The anterior horn of the medial and lateral meniscus was removed and cruciate ligaments resected.   The distal femur was opened with the drill and the intramedullary distal femoral cutting jig was utilized, set at 5 degrees resecting 10 mm off the distal femur.  Care was taken to protect the collateral ligaments.  The distal femoral sizing jig was applied, taking care  to avoid notching.  Then the 4-in-1 cutting jig was applied and the anterior and posterior femur was cut, along with the chamfer cuts.    Then the extramedullary tibial cutting jig was utilized making the appropriate cut using the anterior tibial crest as a reference building in appropriate posterior slope.  Care was taken during the cut to protect the medial and collateral ligaments.  The proximal tibia was removed along with the posterior horns of the menisci.   The posterior medial femoral osteophytes and posterior lateral femoral osteophytes were removed.    The flexion gap was then measured and was symmetric with the extension gap, measured at 10.  I completed the distal femoral preparation using the appropriate jig to prepare the box.  The patella was then measured, and cut with the saw.    The proximal tibia sized and prepared accordingly with the reamer and the punch, and then all components were trialed with the trial insert.  The knee was found to have excellent balance and full motion.    The above named components were then cemented into place and all excess cement was removed.  The trial polyethylene component was in place during cementation, and then was exchanged for the real polyethylene component.    The knee was easily taken through a range of motion and the patella tracked well and the knee irrigated copiously and the parapatellar and subcutaneous tissue closed with vicryl, and monocryl with steri strips for the skin.  The arthrotomy was closed at 90 of flexion. The wounds were dressed with sterile gauze and the tourniquet released and the patient was awakened and returned to the PACU in  stable and satisfactory condition.  There were no complications.  Total tourniquet time was 90 minutes.

## 2019-04-02 NOTE — Anesthesia Postprocedure Evaluation (Signed)
Anesthesia Post Note  Patient: Veronica Finley  Procedure(s) Performed: TOTAL KNEE ARTHROPLASTY (Right Knee)     Patient location during evaluation: Nursing Unit Anesthesia Type: Regional and Spinal Level of consciousness: oriented and awake and alert Pain management: pain level controlled Vital Signs Assessment: post-procedure vital signs reviewed and stable Respiratory status: spontaneous breathing and respiratory function stable Cardiovascular status: blood pressure returned to baseline and stable Postop Assessment: no headache, no backache, no apparent nausea or vomiting and patient able to bend at knees Anesthetic complications: no    Last Vitals:  Vitals:   04/02/19 1100 04/02/19 1115  BP: (!) 113/58 109/62  Pulse: (!) 56 (!) 57  Resp: 12 14  Temp: 36.7 C   SpO2: 95% 94%    Last Pain:  Vitals:   04/02/19 1100  TempSrc:   PainSc: 2                  Barnet Glasgow

## 2019-04-02 NOTE — Interval H&P Note (Signed)
History and Physical Interval Note:  04/02/2019 9:49 AM  Veronica Finley  has presented today for surgery, with the diagnosis of right knee osteoarthritis.  The various methods of treatment have been discussed with the patient and family. After consideration of risks, benefits and other options for treatment, the patient has consented to  Procedure(s) with comments: TOTAL KNEE ARTHROPLASTY (Right) - spinal plus adductor canal as a surgical intervention.  The patient's history has been reviewed, patient examined, no change in status, stable for surgery.  I have reviewed the patient's chart and labs.  Questions were answered to the patient's satisfaction.     Bethani Brugger ANDREW

## 2019-04-02 NOTE — Evaluation (Signed)
Physical Therapy Evaluation Patient Details Name: Veronica Finley MRN: [id number] DOB: 26-Oct-1951 Today's Date: 04/02/2019   History of Present Illness  Pt s/p R TKR   Clinical Impression  Pt s/p R TKR and presents with decreased R LE strength/ROM and post op pain limiting functional mobility.  Pt should progress to dc home with HHPT follow up.    Follow Up Recommendations Home health PT    Equipment Recommendations  Rolling walker with 5" wheels;3in1 (PT)    Recommendations for Other Services OT consult     Precautions / Restrictions Precautions Precautions: Fall;Knee Restrictions Weight Bearing Restrictions: No Other Position/Activity Restrictions: WBAT      Mobility  Bed Mobility Overal bed mobility: Needs Assistance Bed Mobility: Supine to Sit     Supine to sit: Min assist     General bed mobility comments: cues for sequence and use of L LE to self assist  Transfers Overall transfer level: Needs assistance Equipment used: Rolling walker (2 wheeled) Transfers: Sit to/from Stand Sit to Stand: Min assist;From elevated surface         General transfer comment: cues for LE management and use of UEs to self assist  Ambulation/Gait Ambulation/Gait assistance: Min assist Gait Distance (Feet): 40 Feet Assistive device: Rolling walker (2 wheeled) Gait Pattern/deviations: Step-to pattern;Decreased step length - right;Decreased step length - left;Shuffle;Trunk flexed Gait velocity: decr   General Gait Details: cues for posture, position from RW and sequence  Stairs            Wheelchair Mobility    Modified Rankin (Stroke Patients Only)       Balance Overall balance assessment: Mild deficits observed, not formally tested                                           Pertinent Vitals/Pain Pain Assessment: 0-10 Pain Score: 5  Pain Location: R knee Pain Descriptors / Indicators: Aching;Sore Pain Intervention(s): Limited activity  within patient's tolerance;Monitored during session;Premedicated before session;Ice applied    Home Living Family/patient expects to be discharged to:: Private residence Living Arrangements: Alone Available Help at Discharge: Available PRN/intermittently Type of Home: House Home Access: Stairs to enter Entrance Stairs-Rails: Right Entrance Stairs-Number of Steps: 5 Home Layout: Able to live on main level with bedroom/bathroom Home Equipment: None      Prior Function Level of Independence: Independent               Hand Dominance        Extremity/Trunk Assessment   Upper Extremity Assessment Upper Extremity Assessment: Overall WFL for tasks assessed    Lower Extremity Assessment Lower Extremity Assessment: RLE deficits/detail       Communication   Communication: No difficulties  Cognition Arousal/Alertness: Awake/alert Behavior During Therapy: WFL for tasks assessed/performed Overall Cognitive Status: Within Functional Limits for tasks assessed                                        General Comments      Exercises Total Joint Exercises Ankle Circles/Pumps: AROM;Both;15 reps;Supine   Assessment/Plan    PT Assessment Patient needs continued PT services  PT Problem List Decreased strength;Decreased range of motion;Decreased activity tolerance;Decreased mobility;Decreased knowledge of use of DME;Pain;Obesity       PT Treatment Interventions DME instruction;Gait  training;Stair training;Functional mobility training;Therapeutic activities;Therapeutic exercise;Patient/family education    PT Goals (Current goals can be found in the Care Plan section)  Acute Rehab PT Goals Patient Stated Goal: Regain IND PT Goal Formulation: With patient Time For Goal Achievement: 04/16/19 Potential to Achieve Goals: Good    Frequency 7X/week   Barriers to discharge Decreased caregiver support Lives alone with minimal assist    Co-evaluation                AM-PAC PT "6 Clicks" Mobility  Outcome Measure Help needed turning from your back to your side while in a flat bed without using bedrails?: A Little Help needed moving from lying on your back to sitting on the side of a flat bed without using bedrails?: A Little Help needed moving to and from a bed to a chair (including a wheelchair)?: A Little Help needed standing up from a chair using your arms (e.g., wheelchair or bedside chair)?: A Little Help needed to walk in hospital room?: A Little Help needed climbing 3-5 steps with a railing? : A Lot 6 Click Score: 17    End of Session Equipment Utilized During Treatment: Gait belt Activity Tolerance: Patient tolerated treatment well Patient left: in chair;with call bell/phone within reach;with chair alarm set Nurse Communication: Mobility status PT Visit Diagnosis: Difficulty in walking, not elsewhere classified (R26.2)    Time: 6546-5035 PT Time Calculation (min) (ACUTE ONLY): 30 min   Charges:   PT Evaluation $PT Eval Low Complexity: 1 Low PT Treatments $Gait Training: 8-22 mins        [place] Pager [phone] Office [phone]   Rubby Barbary 04/02/2019, 5:39 PM

## 2019-04-02 NOTE — Anesthesia Procedure Notes (Signed)
Anesthesia Regional Block: Adductor canal block   Pre-Anesthetic Checklist: ,, timeout performed, Correct Patient, Correct Site, Correct Laterality, Correct Procedure, Correct Position, site marked, Risks and benefits discussed,  Surgical consent,  Pre-op evaluation,  At surgeon's request and post-op pain management  Laterality: Lower and Right  Prep: chloraprep       Needles:  Injection technique: Single-shot  Needle Type: Echogenic Needle     Needle Length: 9cm  Needle Gauge: 22     Additional Needles:   Procedures:,,,, ultrasound used (permanent image in chart),,,,  Narrative:  Start time: 04/02/2019 7:02 AM End time: 04/02/2019 7:11 AM Injection made incrementally with aspirations every 5 mL.  Performed by: Personally  Anesthesiologist: Barnet Glasgow, MD  Additional Notes: Block assessed prior to surgery. Pt tolerated procedure well.

## 2019-04-02 NOTE — Anesthesia Procedure Notes (Signed)
Spinal  Patient location during procedure: OR Start time: 04/02/2019 7:40 AM End time: 04/02/2019 7:46 AM Staffing Anesthesiologist: Barnet Glasgow, MD Performed: anesthesiologist  Preanesthetic Checklist Completed: patient identified, site marked, surgical consent, pre-op evaluation, timeout performed, IV checked, risks and benefits discussed and monitors and equipment checked Spinal Block Patient position: sitting Prep: DuraPrep Patient monitoring: heart rate, cardiac monitor, continuous pulse ox and blood pressure Approach: midline Location: L3-4 Injection technique: single-shot Needle Needle type: Sprotte  Needle gauge: 24 G Needle length: 9 cm Assessment Sensory level: T4 Additional Notes 1 attempt  Pt tolerated procedure well.

## 2019-04-02 NOTE — Interval H&P Note (Signed)
History and Physical Interval Note:  04/02/2019 7:33 AM  Veronica Finley  has presented today for surgery, with the diagnosis of right knee osteoarthritis.  The various methods of treatment have been discussed with the patient and family. After consideration of risks, benefits and other options for treatment, the patient has consented to  Procedure(s) with comments: TOTAL KNEE ARTHROPLASTY (Right) - spinal plus adductor canal as a surgical intervention.  The patient's history has been reviewed, patient examined, no change in status, stable for surgery.  I have reviewed the patient's chart and labs.  Questions were answered to the patient's satisfaction.     Orlo Brickle ANDREW

## 2019-04-02 NOTE — Transfer of Care (Signed)
Immediate Anesthesia Transfer of Care Note  Patient: Veronica Finley  Procedure(s) Performed: TOTAL KNEE ARTHROPLASTY (Right Knee)  Patient Location: PACU  Anesthesia Type:Spinal  Level of Consciousness: awake, alert , oriented and patient cooperative  Airway & Oxygen Therapy: Patient Spontanous Breathing and Patient connected to face mask oxygen  Post-op Assessment: Report given to RN and Post -op Vital signs reviewed and stable  Post vital signs: stable  Last Vitals:  Vitals Value Taken Time  BP 95/59 04/02/2019 10:02 AM  Temp    Pulse 59 04/02/2019 10:09 AM  Resp 18 04/02/2019 10:09 AM  SpO2 95 % 04/02/2019 10:09 AM  Vitals shown include unvalidated device data.  Last Pain:  Vitals:   04/02/19 0553  TempSrc:   PainSc: 0-No pain         Complications: No apparent anesthesia complications

## 2019-04-03 DIAGNOSIS — M1711 Unilateral primary osteoarthritis, right knee: Secondary | ICD-10-CM | POA: Diagnosis not present

## 2019-04-03 LAB — CBC
HCT: 36.4 % (ref 36.0–46.0)
Hemoglobin: 11.3 g/dL — ABNORMAL LOW (ref 12.0–15.0)
MCH: 29.9 pg (ref 26.0–34.0)
MCHC: 31 g/dL (ref 30.0–36.0)
MCV: 96.3 fL (ref 80.0–100.0)
Platelets: 238 10*3/uL (ref 150–400)
RBC: 3.78 MIL/uL — ABNORMAL LOW (ref 3.87–5.11)
RDW: 12.9 % (ref 11.5–15.5)
WBC: 13.6 10*3/uL — ABNORMAL HIGH (ref 4.0–10.5)
nRBC: 0 % (ref 0.0–0.2)

## 2019-04-03 LAB — BASIC METABOLIC PANEL
Anion gap: 7 (ref 5–15)
BUN: 14 mg/dL (ref 8–23)
CO2: 26 mmol/L (ref 22–32)
Calcium: 8.7 mg/dL — ABNORMAL LOW (ref 8.9–10.3)
Chloride: 105 mmol/L (ref 98–111)
Creatinine, Ser: 0.87 mg/dL (ref 0.44–1.00)
GFR calc Af Amer: >60 mL/min (ref >60)
GFR calc non Af Amer: >60 mL/min (ref >60)
Glucose, Bld: 141 mg/dL — ABNORMAL HIGH (ref 70–99)
Potassium: 3.8 mmol/L (ref 3.5–5.1)
Sodium: 138 mmol/L (ref 135–145)

## 2019-04-03 NOTE — TOC Initial Note (Signed)
Transition of Care [facility]) - Initial/Assessment Note    Patient Details  Name: Veronica Finley MRN: [id number] Date of Birth: 1951/01/08  Transition of Care [facility]) CM/SW Contact:    Erenest Rasher, RN Phone Number: 04/03/2019, 5:14 PM  Clinical Narrative:                 Offered choice for [facility], pt requested [facility] for [facility]. [place] for RW and 3n1 bedside commode to be delivered 5/17 to room prior to dc. Pt states she will be in home alone but feels she will be able to manage.  Expected Discharge Plan: [place] Barriers to Discharge: No Barriers Identified   Patient Goals and CMS Choice Patient states their goals for this hospitalization and ongoing recovery are:: be able to manage at home alone CMS Medicare.gov Compare Post Acute Care list provided to:: Patient Choice offered to / list presented to : Patient  Expected Discharge Plan and Services Expected Discharge Plan: [place] In-house Referral: NA Discharge Planning Services: CM Consult Post Acute Care Choice: Durable Medical Equipment, Home Health Living arrangements for the past 2 months: Apartment Expected Discharge Date: 04/02/19               DME Arranged: 3-N-1, Gilford Rile rolling DME Agency: [employer] Date DME Agency Contacted: 04/03/19 Time DME Agency Contacted: 78 Representative spoke with at DME Agency: [place] Arranged: PT [place] Agency: [place] Date [place]: 04/03/19 Time [place] : 9 Representative spoke with at [place]: Adela Lank  Prior Living Arrangements/Services Living arrangements for the past 2 months: Apartment Lives with:: Self Patient language and need for interpreter reviewed:: Yes Do you feel safe going back to the place where you live?: Yes      Need for Family Participation in Patient Care: Yes (Comment) Care giver support system in place?: Yes (comment)   Criminal Activity/Legal  Involvement Pertinent to Current Situation/Hospitalization: No - Comment as needed  Activities of Daily Living Home Assistive Devices/Equipment: Eyeglasses, Grab bars in shower ADL Screening (condition at time of admission) Patient's cognitive ability adequate to safely complete daily activities?: Yes Is the patient deaf or have difficulty hearing?: No Does the patient have difficulty seeing, even when wearing glasses/contacts?: No Does the patient have difficulty concentrating, remembering, or making decisions?: No Patient able to express need for assistance with ADLs?: Yes Does the patient have difficulty dressing or bathing?: No Independently performs ADLs?: Yes (appropriate for developmental age) Does the patient have difficulty walking or climbing stairs?: Yes Weakness of Legs: None Weakness of Arms/Hands: None  Permission Sought/Granted Permission sought to share information with : Case Manager, PCP       Permission granted to share info w AGENCY: [facility], [facility]        Emotional Assessment Appearance:: Appears stated age Attitude/Demeanor/Rapport: Engaged, Gracious Affect (typically observed): Accepting Orientation: : Oriented to Self, Oriented to Place, Oriented to  Time, Oriented to Situation   Psych Involvement: No (comment)  Admission diagnosis:  right knee osteoarthritis Patient Active Problem List   Diagnosis Date Noted  . Osteoarthritis of right knee 04/02/2019  . S/P knee replacement 04/02/2019  . Arthritis 01/25/2019  . Contact dermatitis due to chemicals 01/25/2019  . OSA on CPAP 10/26/2018  . Primary osteoarthritis of knees, bilateral 07/28/2018  . Generalized hyperhidrosis 07/15/2016  . Hx of laparoscopic gastric banding+ truncal vagotomy 03/24/2006 01/10/2014  . Lumbar spinal stenosis  07/08/2013  . Major depression, chronic 07/08/2013  . Essential hypertension 04/06/2012  . Acquired hypothyroidism 10/08/2011  . Mixed hyperlipidemia  10/08/2011  . AR (allergic rhinitis) 12/20/2010  . Restless legs syndrome (RLS) 12/20/2010  . Obesity (BMI 30-39.9) 12/17/2010  . Osteoarthritis, multiple sites 12/17/2010   PCP:  Leamon Arnt, MD Pharmacy:   [facility] [address], [place] - [address] [place] [address] Phone: [phone] Fax: [phone]     Social Determinants of Health (SDOH) Interventions    Readmission Risk Interventions No flowsheet data found.

## 2019-04-03 NOTE — Evaluation (Signed)
Occupational Therapy Evaluation Patient Details Name: Veronica Finley MRN: [id number] DOB: 10/20/1951 Today's Date: 04/03/2019    History of Present Illness Pt s/p R TKR    Clinical Impression   Pt admitted with the above diagnoses and presents with below problem list. Pt will benefit from continued acute OT to address the below listed deficits and maximize independence with basic ADLs prior to d/c home. PTA pt was independent with ADLs. Pt currently min guard with LB ADLs and functional mobility/transfers. Plans to d/c home alone. Recommend HHOT.      Follow Up Recommendations  Home health OT    Equipment Recommendations  3 in 1 bedside commode    Recommendations for Other Services       Precautions / Restrictions Precautions Precautions: Fall;Knee Restrictions Weight Bearing Restrictions: No Other Position/Activity Restrictions: WBAT      Mobility Bed Mobility Overal bed mobility: Needs Assistance Bed Mobility: Supine to Sit     Supine to sit: Min guard     General bed mobility comments: up in recliner.   Transfers Overall transfer level: Needs assistance Equipment used: Rolling walker (2 wheeled) Transfers: Sit to/from Stand Sit to Stand: Min guard         General transfer comment: cues for LE management and use of UEs to self assist. cues for hand placement with rw.    Balance Overall balance assessment: Mild deficits observed, not formally tested                                         ADL either performed or assessed with clinical judgement   ADL Overall ADL's : Needs assistance/impaired Eating/Feeding: Set up;Sitting   Grooming: Min guard;Wash/dry hands;Standing   Upper Body Bathing: Set up;Sitting   Lower Body Bathing: Min guard;Sit to/from stand   Upper Body Dressing : Sitting;Set up   Lower Body Dressing: Min guard;Sit to/from stand   Toilet Transfer: Min guard;Ambulation;RW   Toileting- Water quality scientist and  Hygiene: Min guard;Sit to/from stand   Tub/ Shower Transfer: Ambulation;Minimal assistance;3 in Mudlogger Details (indicate cue type and reason): clinical judgement. discussed technique but advised pt to wait to attempt tub transfer with [place]. Functional mobility during ADLs: Min guard;Rolling walker General ADL Comments: Pt completed grooming task standing at sink. Discussed strategies for ADLs.     Vision         Perception     Praxis      Pertinent Vitals/Pain Pain Assessment: Faces Pain Score: 7  Faces Pain Scale: Hurts little more Pain Location: R knee Pain Descriptors / Indicators: Aching;Sore Pain Intervention(s): Monitored during session;Limited activity within patient's tolerance;Repositioned     Hand Dominance     Extremity/Trunk Assessment Upper Extremity Assessment Upper Extremity Assessment: Overall WFL for tasks assessed   Lower Extremity Assessment Lower Extremity Assessment: Defer to PT evaluation       Communication Communication Communication: No difficulties   Cognition Arousal/Alertness: Awake/alert Behavior During Therapy: WFL for tasks assessed/performed Overall Cognitive Status: Within Functional Limits for tasks assessed                                     General Comments       Exercises Exercises: Total Joint Total Joint Exercises Ankle Circles/Pumps: AROM;Both;15 reps;Supine Quad Sets: AROM;Both;10 reps;Supine Heel Slides:  AAROM;Right;10 reps;Supine Straight Leg Raises: AAROM;AROM;Right;10 reps;Supine Goniometric ROM: AAROM R knee -5 - 60   Shoulder Instructions      Home Living Family/patient expects to be discharged to:: Private residence Living Arrangements: Alone Available Help at Discharge: Available PRN/intermittently Type of Home: House Home Access: Stairs to enter [employer] of Steps: 5 Entrance Stairs-Rails: Right Home Layout: Able to live on main level with  bedroom/bathroom     Bathroom Shower/Tub: Tub/shower unit         Home Equipment: None   Additional Comments: 1/2 bath on ground floor, shower is upstairs      Prior Functioning/Environment Level of Independence: Independent                 OT Problem List: Decreased strength;Impaired balance (sitting and/or standing);Decreased knowledge of use of DME or AE;Decreased knowledge of precautions;Pain      OT Treatment/Interventions: Self-care/ADL training;DME and/or AE instruction;Therapeutic activities;Patient/family education;Balance training    OT Goals(Current goals can be found in the care plan section) Acute Rehab OT Goals Patient Stated Goal: Regain IND OT Goal Formulation: With patient Time For Goal Achievement: 04/10/19 Potential to Achieve Goals: Good ADL Goals Pt Will Perform Grooming: with modified independence;standing Pt Will Perform Lower Body Bathing: with modified independence;sit to/from stand Pt Will Perform Lower Body Dressing: with modified independence;sit to/from stand Pt Will Transfer to Toilet: with modified independence;ambulating Pt Will Perform Toileting - Clothing Manipulation and hygiene: with modified independence;sit to/from stand  OT Frequency: Min 2X/week   Barriers to D/C: Decreased caregiver support  plans to d/c home alone       Co-evaluation              AM-PAC OT "6 Clicks" Daily Activity     Outcome Measure Help from another person eating meals?: None Help from another person taking care of personal grooming?: None Help from another person toileting, which includes using toliet, bedpan, or urinal?: None Help from another person bathing (including washing, rinsing, drying)?: A Little Help from another person to put on and taking off regular upper body clothing?: None Help from another person to put on and taking off regular lower body clothing?: A Little 6 Click Score: 22   End of Session Equipment Utilized During  Treatment: Rolling walker  Activity Tolerance: Patient tolerated treatment well Patient left: in chair;with call bell/phone within reach  OT Visit Diagnosis: Unsteadiness on feet (R26.81);Pain Pain - Right/Left: Right Pain - part of body: Knee                Time: 4656-8127 OT Time Calculation (min): 20 min Charges:  OT General Charges $OT Visit: 1 Visit OT Evaluation $OT Eval Low Complexity: [place], OT Acute Rehabilitation Services Pager: [phone] Office: [phone]   Hortencia Pilar 04/03/2019, 11:47 AM

## 2019-04-03 NOTE — Care Management Obs Status (Signed)
[place] NOTIFICATION   Patient Details  Name: Veronica Finley MRN: [id number] Date of Birth: Mar 16, 1951   Medicare Observation Status Notification Given:  Yes    Erenest Rasher, RN 04/03/2019, 1:06 PM

## 2019-04-03 NOTE — Progress Notes (Signed)
Physical Therapy Treatment Patient Details Name: Veronica Finley MRN: [id number] DOB: 10/26/1951 Today's Date: 04/03/2019    History of Present Illness Pt s/p R TKR     PT Comments    Pt motivated and progressing with mobility but with increased pain this am.   Follow Up Recommendations  Home health PT     Equipment Recommendations  Rolling walker with 5" wheels;3in1 (PT)    Recommendations for Other Services OT consult     Precautions / Restrictions Precautions Precautions: Fall;Knee Restrictions Weight Bearing Restrictions: No Other Position/Activity Restrictions: WBAT    Mobility  Bed Mobility Overal bed mobility: Needs Assistance Bed Mobility: Supine to Sit     Supine to sit: Min guard     General bed mobility comments: cues for sequence and use of L LE to self assist  Transfers Overall transfer level: Needs assistance Equipment used: Rolling walker (2 wheeled) Transfers: Sit to/from Stand Sit to Stand: Min guard         General transfer comment: cues for LE management and use of UEs to self assist  Ambulation/Gait Ambulation/Gait assistance: Min assist Gait Distance (Feet): 75 Feet Assistive device: Rolling walker (2 wheeled) Gait Pattern/deviations: Step-to pattern;Decreased step length - right;Decreased step length - left;Shuffle;Trunk flexed Gait velocity: decr   General Gait Details: cues for posture, position from RW and sequence   Stairs             Wheelchair Mobility    Modified Rankin (Stroke Patients Only)       Balance Overall balance assessment: Mild deficits observed, not formally tested                                          Cognition Arousal/Alertness: Awake/alert Behavior During Therapy: WFL for tasks assessed/performed Overall Cognitive Status: Within Functional Limits for tasks assessed                                        Exercises Total Joint Exercises Ankle  Circles/Pumps: AROM;Both;15 reps;Supine Quad Sets: AROM;Both;10 reps;Supine Heel Slides: AAROM;Right;10 reps;Supine Straight Leg Raises: AAROM;AROM;Right;10 reps;Supine Goniometric ROM: AAROM R knee -5 - 60    General Comments        Pertinent Vitals/Pain Pain Assessment: 0-10 Pain Score: 7  Pain Location: R knee Pain Descriptors / Indicators: Aching;Sore Pain Intervention(s): Monitored during session;Limited activity within patient's tolerance;Patient requesting pain meds-RN notified;Ice applied    Home Living                      Prior Function            PT Goals (current goals can now be found in the care plan section) Acute Rehab PT Goals Patient Stated Goal: Regain IND PT Goal Formulation: With patient Time For Goal Achievement: 04/16/19 Potential to Achieve Goals: Good Progress towards PT goals: Progressing toward goals    Frequency    7X/week      PT Plan Current plan remains appropriate    Co-evaluation              AM-PAC PT "6 Clicks" Mobility   Outcome Measure  Help needed turning from your back to your side while in a flat bed without using bedrails?: A Little Help needed moving from lying  on your back to sitting on the side of a flat bed without using bedrails?: A Little Help needed moving to and from a bed to a chair (including a wheelchair)?: A Little Help needed standing up from a chair using your arms (e.g., wheelchair or bedside chair)?: A Little Help needed to walk in hospital room?: A Little Help needed climbing 3-5 steps with a railing? : A Lot 6 Click Score: 17    End of Session Equipment Utilized During Treatment: Gait belt Activity Tolerance: Patient tolerated treatment well Patient left: in chair;with call bell/phone within reach;with chair alarm set Nurse Communication: Mobility status PT Visit Diagnosis: Difficulty in walking, not elsewhere classified (R26.2)     Time: 7902-4097 PT Time Calculation (min) (ACUTE  ONLY): 33 min  Charges:  $Gait Training: 8-22 mins $Therapeutic Exercise: 8-22 mins                     [place] Pager [phone] Office [phone]    [name] 04/03/2019, 9:03 AM

## 2019-04-03 NOTE — Progress Notes (Signed)
Subjective: 1 Day Post-Op Procedure(s) (LRB): TOTAL KNEE ARTHROPLASTY (Right) Patient reports pain as mild.   Patient seen in rounds for Dr. Theda Sers. Patient is well, and has had no acute complaints or problems other than pain in the right knee. No acute events overnight. Foley catheter removed this AM.  We will continue therapy today.   Objective: Vital signs in last 24 hours: Temp:  [97.4 F (36.3 C)-98.2 F (36.8 C)] 98.2 F (36.8 C) (05/16 0529) Pulse Rate:  [54-70] 62 (05/16 0529) Resp:  [12-23] 16 (05/16 0529) BP: (95-123)/(57-68) 107/64 (05/16 0529) SpO2:  [94 %-100 %] 97 % (05/16 0529) Weight:  [112.3 kg] 112.3 kg (05/15 1130)  Intake/Output from previous day:  Intake/Output Summary (Last 24 hours) at 04/03/2019 0802 Last data filed at 04/03/2019 0748 Gross per 24 hour  Intake 4186.29 ml  Output 3365 ml  Net 821.29 ml     Intake/Output this shift: Total I/O In: 375 [P.O.:240; I.V.:135] Out: -   Labs: Recent Labs    04/03/19 0334  HGB 11.3*   Recent Labs    04/03/19 0334  WBC 13.6*  RBC 3.78*  HCT 36.4  PLT 238   Recent Labs    04/03/19 0334  NA 138  K 3.8  CL 105  CO2 26  BUN 14  CREATININE 0.87  GLUCOSE 141*  CALCIUM 8.7*   No results for input(s): LABPT, INR in the last 72 hours.  Exam: General - Patient is Alert and Oriented Extremity - Neurologically intact Sensation intact distally Intact pulses distally Dorsiflexion/Plantar flexion intact Dressing - dressing C/D/I Motor Function - intact, moving foot and toes well on exam.   Past Medical History:  Diagnosis Date  . Arthritis   . Depression   . Facial twitching   . Hyperlipidemia   . Hypertension   . Hypothyroidism   . Lumbar spinal stenosis 07/08/2013  . Murmur, cardiac    per cardiologist she saw before having lap band surgery that she had a mild leaky valve and a mild murmur   . OSA on CPAP 10/26/2018    Assessment/Plan: 1 Day Post-Op Procedure(s) (LRB): TOTAL  KNEE ARTHROPLASTY (Right) Active Problems:   Osteoarthritis of right knee   S/P knee replacement  Estimated body mass index is 39.95 kg/m as calculated from the following:   Height as of this encounter: 5\' 6"  (1.676 m).   Weight as of this encounter: 112.3 kg. Advance diet Up with therapy  Anticipated LOS equal to or greater than 2 midnights due to - Age 66 and older with one or more of the following:  - Obesity  - Expected need for hospital services (PT, OT, Nursing) required for safe  discharge  - Anticipated need for postoperative skilled nursing care or inpatient rehab  - Active co-morbidities: None OR   - Unanticipated findings during/Post Surgery: None  - Patient is a high risk of re-admission due to: None    DVT Prophylaxis - Aspirin Weight bearing as tolerated. D/C O2 and pulse ox and try on room air. Hemovac pulled without difficulty, will begin therapy today.  Plan is to go Home after hospital stay. Plan for discharge home tomorrow. Patient is home alone and feels she will need HHPT for safe transition home, which she states she discussed with Dr. Theda Sers. I have ordered this today. She does not feel comfortable going home today, and I believe she will benefit from additional sessions of PT in order to ensure safe discharge home. She  will continue working with PT today. She is in need of DME, including walker, cane, and 3-n-1 commode. We will arrange for this to be provided prior to discharge if possible.   Griffith Citron, PA-C Orthopedic Surgery 04/03/2019, 8:02 AM

## 2019-04-03 NOTE — Progress Notes (Signed)
Physical Therapy Treatment Patient Details Name: Veronica Finley MRN: [id number] DOB: November 22, 1950 Today's Date: 04/03/2019    History of Present Illness Pt s/p R TKR     PT Comments    Pt very motivated, progressing well with mobility and eager for dc home tomorrow.   Follow Up Recommendations  Home health PT     Equipment Recommendations  Rolling walker with 5" wheels;3in1 (PT)    Recommendations for Other Services OT consult     Precautions / Restrictions Precautions Precautions: Fall;Knee Restrictions Weight Bearing Restrictions: No Other Position/Activity Restrictions: WBAT    Mobility  Bed Mobility Overal bed mobility: Needs Assistance Bed Mobility: Sit to Supine       Sit to supine: Min guard      Transfers Overall transfer level: Needs assistance Equipment used: Rolling walker (2 wheeled) Transfers: Sit to/from Stand Sit to Stand: Min guard;Supervision         General transfer comment: cues for LE management and use of UEs to self assist. cues for hand placement with rw.  Ambulation/Gait Ambulation/Gait assistance: Min guard Gait Distance (Feet): 150 Feet Assistive device: Rolling walker (2 wheeled) Gait Pattern/deviations: Step-to pattern;Decreased step length - right;Decreased step length - left;Shuffle;Trunk flexed Gait velocity: decr   General Gait Details: cues for posture, position from RW and sequence   Stairs             Wheelchair Mobility    Modified Rankin (Stroke Patients Only)       Balance Overall balance assessment: Mild deficits observed, not formally tested                                          Cognition Arousal/Alertness: Awake/alert Behavior During Therapy: WFL for tasks assessed/performed Overall Cognitive Status: Within Functional Limits for tasks assessed                                        Exercises Total Joint Exercises Ankle Circles/Pumps: AROM;Both;15  reps;Supine Quad Sets: AROM;Both;10 reps;Supine Heel Slides: AAROM;Right;10 reps;Supine Straight Leg Raises: AAROM;AROM;Right;10 reps;Supine Goniometric ROM: AAROM R knee -8 - 85    General Comments        Pertinent Vitals/Pain Pain Assessment: 0-10 Pain Score: 5  Pain Location: R knee Pain Descriptors / Indicators: Aching;Sore Pain Intervention(s): Limited activity within patient's tolerance;Monitored during session;Premedicated before session;Ice applied    Home Living                      Prior Function            PT Goals (current goals can now be found in the care plan section) Acute Rehab PT Goals Patient Stated Goal: Regain IND PT Goal Formulation: With patient Time For Goal Achievement: 04/16/19 Potential to Achieve Goals: Good Progress towards PT goals: Progressing toward goals    Frequency    7X/week      PT Plan Current plan remains appropriate    Co-evaluation              AM-PAC PT "6 Clicks" Mobility   Outcome Measure  Help needed turning from your back to your side while in a flat bed without using bedrails?: A Little Help needed moving from lying on your back to sitting on the side  of a flat bed without using bedrails?: A Little Help needed moving to and from a bed to a chair (including a wheelchair)?: A Little Help needed standing up from a chair using your arms (e.g., wheelchair or bedside chair)?: A Little Help needed to walk in hospital room?: A Little Help needed climbing 3-5 steps with a railing? : A Lot 6 Click Score: 17    End of Session Equipment Utilized During Treatment: Gait belt Activity Tolerance: Patient tolerated treatment well Patient left: in bed;with call bell/phone within reach;with bed alarm set Nurse Communication: Mobility status PT Visit Diagnosis: Difficulty in walking, not elsewhere classified (R26.2)     Time: 4268-3419 PT Time Calculation (min) (ACUTE ONLY): 27 min  Charges:  $Gait Training:  8-22 mins $Therapeutic Exercise: 8-22 mins                     [place] Pager [phone] Office [phone]    Zaiyah Sottile 04/03/2019, 4:19 PM

## 2019-04-03 NOTE — Progress Notes (Signed)
Home health agencies that serve [phone]. Your favorite home health agencies  [place] of Patient Care Rating Patient Survey Summary Rating  [place]  [phone] 3 out of 5 stars 5 out of [place]  [phone] 3 out of 5 stars 4 out of [place]  [phone] 4 out of 5 stars 4 out of [place]  [phone] 4  out of 5 stars 3 out of [place]  [phone] 4 out of 5 stars 4 out of [place]  [phone] 4 out of 5 stars 4 out of [place]  [phone] 4  out of 5 stars 4 out of [place]  [phone] 4 out of 5 stars 4 out of 5 stars  [facility] [place]  [phone]  out of 5 stars 4 out of [place]  [phone] 3 out of 5 stars 4 out of 5 stars  [facility]  [phone] 4 out of 5 stars Not Available  [facility]  [phone] 3  out of 5 stars 3 out of 5 stars  [facility]  [phone] 3  out of 5 stars 4 out of [place]  [phone] 3  out of 5 stars 4 out of [place]  [phone] 3  out of 5 stars 3 out of [place]  [phone] 3  out of 5 stars Not Available  [place]  [phone] 3  out of 5 stars 4 out of [place]  [phone] 4  out of 5 stars 3 out of [place]  [phone] 4  out of 5 stars 2 out of [place] number Footnote as displayed on [place]  1 This agency provides services under a federal waiver program to non-traditional, chronic long term population.  2  This agency provides services to a special needs population.  3 Not Available.  4 The number of patient episodes for this measure is too small to report.  5 This measure currently does not have data or provider has been certified/recertified for less than 6 months.  6 The national average for this measure is not provided because of state-to-state differences in data collection.  7 Medicare is not displaying rates for this measure for any home health agency, because of an issue with the data.  8 There were problems with the data and they are being corrected.  9 Zero, or very few, patients met the survey's rules for inclusion. The scores shown, if any, reflect a very small number of surveys and may not accurately tell how an agency is doing.  10 Survey results are based on less than 12 months of data.  11 Fewer than 70 patients completed the survey. Use the scores shown, if any, with caution as the number of surveys may be too low to accurately tell how an agency is  doing.  12 No survey results are available for this period.  13 Data suppressed by CMS for one or more quarters.

## 2019-04-04 DIAGNOSIS — M1711 Unilateral primary osteoarthritis, right knee: Secondary | ICD-10-CM | POA: Diagnosis not present

## 2019-04-04 LAB — CBC
HCT: 33 % — ABNORMAL LOW (ref 36.0–46.0)
Hemoglobin: 10.6 g/dL — ABNORMAL LOW (ref 12.0–15.0)
MCH: 29.9 pg (ref 26.0–34.0)
MCHC: 32.1 g/dL (ref 30.0–36.0)
MCV: 93.2 fL (ref 80.0–100.0)
Platelets: 234 K/uL (ref 150–400)
RBC: 3.54 MIL/uL — ABNORMAL LOW (ref 3.87–5.11)
RDW: 12.9 % (ref 11.5–15.5)
WBC: 16.5 K/uL — ABNORMAL HIGH (ref 4.0–10.5)
nRBC: 0 % (ref 0.0–0.2)

## 2019-04-04 MED ORDER — FERROUS SULFATE 325 (65 FE) MG PO TABS: 325.0000 mg | ORAL_TABLET | Freq: Three times a day (TID) | ORAL | 0 refills | Status: DC

## 2019-04-04 MED ORDER — ENOXAPARIN (LOVENOX) PATIENT EDUCATION KIT
PACK | Freq: Once | Status: AC
  Administered 2019-04-04: 13:00:00
  Filled 2019-04-04: qty 1

## 2019-04-04 MED ORDER — ASPIRIN EC 325 MG PO TBEC: 325.0000 mg | DELAYED_RELEASE_TABLET | Freq: Two times a day (BID) | ORAL | 0 refills | Status: DC

## 2019-04-04 MED ORDER — ENOXAPARIN SODIUM 30 MG/0.3ML ~~LOC~~ SOLN: 30.0000 mg | Freq: Two times a day (BID) | SUBCUTANEOUS | 0 refills | Status: DC

## 2019-04-04 NOTE — Progress Notes (Signed)
Physical Therapy Treatment Patient Details Name: NYEEMA WANT MRN: [id number] DOB: Feb 18, 1951 Today's Date: 04/04/2019    History of Present Illness Pt s/p R TKR     PT Comments    Pt performed home therex program - increased pain and decreased ROM vs yesterday.  Written program provided.   Follow Up Recommendations  Home health PT     Equipment Recommendations  Rolling walker with 5" wheels;3in1 (PT)    Recommendations for Other Services OT consult     Precautions / Restrictions Precautions Precautions: Fall;Knee Restrictions Weight Bearing Restrictions: No Other Position/Activity Restrictions: WBAT    Mobility  Bed Mobility               General bed mobility comments: up in chair  Transfers Overall transfer level: Needs assistance Equipment used: Rolling walker (2 wheeled) Transfers: Sit to/from Stand Sit to Stand: Min guard;Supervision         General transfer comment: cues for LE management and use of UEs to self assist. cues for hand placement with rw.  Ambulation/Gait                 Stairs             Wheelchair Mobility    Modified Rankin (Stroke Patients Only)       Balance Overall balance assessment: Mild deficits observed, not formally tested                                          Cognition Arousal/Alertness: Awake/alert Behavior During Therapy: WFL for tasks assessed/performed Overall Cognitive Status: Within Functional Limits for tasks assessed                                        Exercises Total Joint Exercises Ankle Circles/Pumps: AROM;Both;15 reps;Supine Quad Sets: AROM;Both;10 reps;Supine Heel Slides: AAROM;Right;Supine;15 reps Straight Leg Raises: AAROM;AROM;Right;Supine;15 reps Goniometric ROM: AAROM R knee - 8 - 65 - pain limited    General Comments        Pertinent Vitals/Pain Pain Assessment: 0-10 Pain Score: 8  Faces Pain Scale: Hurts little  more Pain Location: R knee with knee flexion therex Pain Descriptors / Indicators: Aching;Sore Pain Intervention(s): Limited activity within patient's tolerance;Monitored during session;Premedicated before session;Ice applied    Home Living                      Prior Function            PT Goals (current goals can now be found in the care plan section) Acute Rehab PT Goals Patient Stated Goal: Regain IND PT Goal Formulation: With patient Time For Goal Achievement: 04/16/19 Potential to Achieve Goals: Good Progress towards PT goals: Progressing toward goals    Frequency    7X/week      PT Plan Current plan remains appropriate    Co-evaluation              AM-PAC PT "6 Clicks" Mobility   Outcome Measure  Help needed turning from your back to your side while in a flat bed without using bedrails?: A Little Help needed moving from lying on your back to sitting on the side of a flat bed without using bedrails?: A Little Help needed moving to and from a  bed to a chair (including a wheelchair)?: A Little Help needed standing up from a chair using your arms (e.g., wheelchair or bedside chair)?: A Little Help needed to walk in hospital room?: A Little Help needed climbing 3-5 steps with a railing? : A Lot 6 Click Score: 17    End of Session   Activity Tolerance: Patient limited by pain Patient left: in bed;with call bell/phone within reach;with bed alarm set Nurse Communication: Mobility status PT Visit Diagnosis: Difficulty in walking, not elsewhere classified (R26.2)     Time: 8756-4332 PT Time Calculation (min) (ACUTE ONLY): 20 min  Charges:  $Therapeutic Exercise: 8-22 mins                     [place] Pager [phone] Office [phone]    Yuleni Burich 04/04/2019, 1:44 PM

## 2019-04-04 NOTE — Progress Notes (Signed)
Spoke with PA on call [facility] to clarify DVT prophylaxis orders for patient to follow at home after discharge. The patient was reluctant to give herself injections at home and was concerned about cost. Veronica Finley instructed me to tell the patient not to fill the Lovenox Rx from her pharmacy, and to take Aspirin 374m by mouth tonight and tomorrow morning, and call the surgeons office tomorrow morning 5/18 to speak with Dr CTheda Sersor his PA BMariane Masters To find out if she can continue taking aspirin, or if they want her to take the Lovenox. I gave the patient a Lovenox teaching kit and went over administration instructions with her just in case. I wrote all the information down on the patients AVS and went over it with her in detail in regards to taking ASA tonight and in the morning, NOT filling the Lovenox yet, and calling the office in the morning for clarification on what to take for DVT prophylaxis.

## 2019-04-04 NOTE — Progress Notes (Signed)
Occupational Therapy Treatment Patient Details Name: Veronica Finley MRN: [id number] DOB: 03-30-1951 Today's Date: 04/04/2019    History of present illness Pt s/p R TKR    OT comments  Pt admitted with above diagnosis, progressing toward stated goals. Focused session on LB ADL education, introduced LB ADL equipment. Pt shows ability to touch toes, states she has natural flexibility. Pt will benefit most from long handled sponge and reacher to complete BADL independently. Pt able to use this equipment with set up A and VC's for first time instruction. 3:1 education given for use in tub shower, pt in understanding. Anticipate d/c home this date. D/c plans remain appropriate.   Follow Up Recommendations  Home health OT    Equipment Recommendations  3 in 1 bedside commode    Recommendations for Other Services      Precautions / Restrictions Precautions Precautions: Fall;Knee Restrictions Weight Bearing Restrictions: No Other Position/Activity Restrictions: WBAT       Mobility Bed Mobility               General bed mobility comments: up in chair  Transfers Overall transfer level: Needs assistance Equipment used: Rolling walker (2 wheeled) Transfers: Sit to/from Stand Sit to Stand: Min guard;Supervision         General transfer comment: cues for LE management and use of UEs to self assist. cues for hand placement with rw.    Balance Overall balance assessment: Mild deficits observed, not formally tested                                         ADL either performed or assessed with clinical judgement   ADL Overall ADL's : Needs assistance/impaired                                       General ADL Comments: LB ADL equipment discussion and education; 3:1 discussion and education     Vision Baseline Vision/History: Wears glasses Wears Glasses: At all times Patient Visual Report: No change from baseline     Perception      Praxis      Cognition Arousal/Alertness: Awake/alert Behavior During Therapy: WFL for tasks assessed/performed Overall Cognitive Status: Within Functional Limits for tasks assessed                                          Exercises     Shoulder Instructions       General Comments      Pertinent Vitals/ Pain       Pain Assessment: Faces Faces Pain Scale: Hurts little more Pain Location: R knee Pain Descriptors / Indicators: Aching;Sore Pain Intervention(s): Limited activity within patient's tolerance;Monitored during session;Ice applied  Home Living                                          Prior Functioning/Environment              Frequency  Min 2X/week        Progress Toward Goals  OT Goals(current goals can now be found in the care plan section)  Progress towards OT goals: Progressing toward goals  Acute Rehab OT Goals Patient Stated Goal: to go home with ind OT Goal Formulation: With patient Time For Goal Achievement: 04/10/19 Potential to Achieve Goals: Good  Plan Discharge plan remains appropriate    Co-evaluation                 AM-PAC OT "6 Clicks" Daily Activity     Outcome Measure   Help from another person eating meals?: None Help from another person taking care of personal grooming?: None Help from another person toileting, which includes using toliet, bedpan, or urinal?: None Help from another person bathing (including washing, rinsing, drying)?: A Little Help from another person to put on and taking off regular upper body clothing?: None Help from another person to put on and taking off regular lower body clothing?: A Little 6 Click Score: 22    End of Session Equipment Utilized During Treatment: Rolling walker  OT Visit Diagnosis: Unsteadiness on feet (R26.81);Other abnormalities of gait and mobility (R26.89) Pain - Right/Left: Right Pain - part of body: Knee   Activity Tolerance Patient  tolerated treatment well   Patient Left in chair;with call bell/phone within reach   Nurse Communication          Time: 3818-2993 OT Time Calculation (min): 23 min  Charges: OT General Charges $OT Visit: 1 Visit OT Treatments $Self Care/Home Management : 23-37 mins  Zenovia Jarred, MSOT, OTR/L Behavioral Health OT/ Acute Relief OT [facility] Office: [place] 04/04/2019, 10:13 AM

## 2019-04-04 NOTE — Plan of Care (Signed)
  Problem: Clinical Measurements: Goal: Postoperative complications will be avoided or minimized Outcome: Adequate for Discharge   Problem: Education: Goal: Knowledge of the prescribed therapeutic regimen will improve Outcome: Adequate for Discharge   Problem: Clinical Measurements: Goal: Cardiovascular complication will be avoided Outcome: Adequate for Discharge

## 2019-04-04 NOTE — Progress Notes (Addendum)
Subjective: 2 Days Post-Op Procedure(s) (LRB): TOTAL KNEE ARTHROPLASTY (Right) Patient reports pain as moderate. Tolerating PO without n/v.  +void, +flatus, -BM, but no abdominal pain or distention. +ambulation. Denies calf pain, CP, SOB, dizziness, sweats/chills.   Objective: Vital signs in last 24 hours: Temp:  [97.6 F (36.4 C)-98.4 F (36.9 C)] 98.2 F (36.8 C) (05/17 0642) Pulse Rate:  [61-72] 61 (05/17 0642) Resp:  [14-16] 16 (05/17 0642) BP: (114-130)/(50-64) 114/63 (05/17 0642) SpO2:  [90 %-98 %] 91 % (05/17 0642)  Intake/Output from previous day: 05/16 0701 - 05/17 0700 In: 2134.9 [P.O.:1900; I.V.:234.9] Out: 0  Intake/Output this shift: No intake/output data recorded.  Recent Labs    04/03/19 0334 04/04/19 0443  HGB 11.3* 10.6*   Recent Labs    04/03/19 0334 04/04/19 0443  WBC 13.6* 16.5*  RBC 3.78* 3.54*  HCT 36.4 33.0*  PLT 238 234   Recent Labs    04/03/19 0334  NA 138  K 3.8  CL 105  CO2 26  BUN 14  CREATININE 0.87  GLUCOSE 141*  CALCIUM 8.7*   No results for input(s): LABPT, INR in the last 72 hours.  Neurologically intact ABD soft Neurovascular intact Sensation intact distally Intact pulses distally Dorsiflexion/Plantar flexion intact Incision: dressing C/D/I No cellulitis present Compartment soft, no palpable cords, non-tender, homans negative bilaterally.    Assessment/Plan: 2 Days Post-Op Procedure(s) (LRB): TOTAL KNEE ARTHROPLASTY (Right) Discharge home with home health -- will put in discharge order, will send home with aquacel. Scripts in chart. Patient to f/u in office (appointment already made) DVT Prophylaxis - Aspirin, teds, ambulation Addednum: Patient was started on lovenox 5/16, I have written her a 5 days supply to take home to complete a 7 day course of DVT ppx if needed. Patient states that she was not tolld she would need to be on this at home. Unfortunately I do not know Dr. Theda Sers protocol. I have contacted my  attending Dr. Rolena Infante, who attempted to contact Dr. Theda Sers but was unsuccessful. Per Dr. Rolena Infante the patient may take ASA BID upon discharge. The patient will contact Dr. Theda Sers staff on Monday, if instructed she may fill the lovenox tomorrow, if not she will continue ASA. Pain meds: Per RN, scripts were not in chart. I sent these (robaxin and oxycodone) electronically to pharmacy. Weight bearing as tolerated.  Anticipated LOS equal to or greater than 2 midnights due to - Age 14 and older with one or more of the following:             - Obesity             - Expected need for hospital services (PT, OT, Nursing) required for safe   discharge             - Anticipated need for postoperative skilled nursing care or inpatient rehab             - Active co-morbidities: None OR   - Unanticipated findings during/Post Surgery: None  - Patient is a high risk of re-admission due to: None   Yvonne Kendall [place] 04/04/2019, 9:00 AM

## 2019-04-04 NOTE — Progress Notes (Signed)
Physical Therapy Treatment Patient Details Name: Veronica Finley MRN: [id number] DOB: [birth year] Today's Date: 04/04/2019    History of Present Illness Pt s/p R TKR     PT Comments    Pt progressing well with mobility and reviewed stairs this am with written instruction provided.  Pt states she feels comfortable with ability to manage at home without live in assistance   Follow Up Recommendations  Home health PT     Equipment Recommendations  Rolling walker with 5" wheels;3in1 (PT)    Recommendations for Other Services OT consult     Precautions / Restrictions Precautions Precautions: Fall;Knee Restrictions Weight Bearing Restrictions: No Other Position/Activity Restrictions: WBAT    Mobility  Bed Mobility Overal bed mobility: Needs Assistance Bed Mobility: Supine to Sit     Supine to sit: Modified independent (Device/Increase time)     General bed mobility comments: Pt unassisted to side of bed  Transfers Overall transfer level: Needs assistance Equipment used: Rolling walker (2 wheeled) Transfers: Sit to/from Stand Sit to Stand: Supervision         General transfer comment: cues for LE management  Ambulation/Gait Ambulation/Gait assistance: Supervision;Modified independent (Device/Increase time) Gait Distance (Feet): 150 Feet Assistive device: Rolling walker (2 wheeled) Gait Pattern/deviations: Step-to pattern;Decreased step length - right;Decreased step length - left;Shuffle;Trunk flexed Gait velocity: decr   General Gait Details: min cues for posture, position from RW and sequence   Stairs Stairs: Yes Stairs assistance: Min assist Stair Management: One rail Left;Step to pattern;Forwards;With crutches Number of Stairs: 5 General stair comments: cues for sequence and foot/crutch placement   Wheelchair Mobility    Modified Rankin (Stroke Patients Only)       Balance Overall balance assessment: Mild deficits observed, not formally  tested                                          Cognition Arousal/Alertness: Awake/alert Behavior During Therapy: WFL for tasks assessed/performed Overall Cognitive Status: Within Functional Limits for tasks assessed                                        Exercises Total Joint Exercises Ankle Circles/Pumps: AROM;Both;15 reps;Supine Quad Sets: AROM;Both;10 reps;Supine Heel Slides: AAROM;Right;Supine;15 reps Straight Leg Raises: AAROM;AROM;Right;Supine;15 reps Goniometric ROM: AAROM R knee - 8 - 65 - pain limited    General Comments        Pertinent Vitals/Pain Pain Assessment: 0-10 Pain Score: 5  Faces Pain Scale: Hurts little more Pain Location: R knee Pain Descriptors / Indicators: Aching;Sore Pain Intervention(s): Limited activity within patient's tolerance;Monitored during session;Premedicated before session;Ice applied    Home Living                      Prior Function            PT Goals (current goals can now be found in the care plan section) Acute Rehab PT Goals Patient Stated Goal: Regain IND PT Goal Formulation: With patient Time For Goal Achievement: 04/16/19 Potential to Achieve Goals: Good Progress towards PT goals: Progressing toward goals    Frequency    7X/week      PT Plan Current plan remains appropriate    Co-evaluation  AM-PAC PT "6 Clicks" Mobility   Outcome Measure  Help needed turning from your back to your side while in a flat bed without using bedrails?: None Help needed moving from lying on your back to sitting on the side of a flat bed without using bedrails?: None Help needed moving to and from a bed to a chair (including a wheelchair)?: A Little Help needed standing up from a chair using your arms (e.g., wheelchair or bedside chair)?: A Little Help needed to walk in hospital room?: A Little Help needed climbing 3-5 steps with a railing? : A Little 6 Click Score:  20    End of Session Equipment Utilized During Treatment: Gait belt Activity Tolerance: Patient tolerated treatment well Patient left: in chair;with call bell/phone within reach Nurse Communication: Mobility status PT Visit Diagnosis: Difficulty in walking, not elsewhere classified (R26.2)     Time: 1950-9326 PT Time Calculation (min) (ACUTE ONLY): 21 min  Charges:  $Gait Training: 8-22 mins $Therapeutic Exercise: 8-22 mins                     [place] Pager [phone] Office [phone]    Veronica Finley 04/04/2019, 1:51 PM

## 2019-04-05 ENCOUNTER — Encounter (HOSPITAL_COMMUNITY): Payer: Self-pay | Admitting: Specialist

## 2019-04-14 ENCOUNTER — Encounter: Payer: Self-pay | Admitting: Family Medicine

## 2019-04-14 NOTE — Telephone Encounter (Signed)
Patient called to schedule appointment but I was unable to reach office.Please call pt. She wants an appointment ASAP

## 2019-04-15 ENCOUNTER — Encounter: Payer: Self-pay | Admitting: Family Medicine

## 2019-04-15 ENCOUNTER — Ambulatory Visit (INDEPENDENT_AMBULATORY_CARE_PROVIDER_SITE_OTHER): Payer: Medicare Other | Admitting: Family Medicine

## 2019-04-15 ENCOUNTER — Other Ambulatory Visit: Payer: Self-pay

## 2019-04-15 DIAGNOSIS — F329 Major depressive disorder, single episode, unspecified: Secondary | ICD-10-CM | POA: Diagnosis not present

## 2019-04-15 DIAGNOSIS — D649 Anemia, unspecified: Secondary | ICD-10-CM

## 2019-04-15 DIAGNOSIS — G2581 Restless legs syndrome: Secondary | ICD-10-CM

## 2019-04-15 DIAGNOSIS — I10 Essential (primary) hypertension: Secondary | ICD-10-CM | POA: Diagnosis not present

## 2019-04-15 DIAGNOSIS — Z96651 Presence of right artificial knee joint: Secondary | ICD-10-CM

## 2019-04-15 MED ORDER — ROPINIROLE HCL 4 MG PO TABS: 4.0000 mg | ORAL_TABLET | Freq: Every day | ORAL | 1 refills | Status: DC

## 2019-04-15 NOTE — Patient Instructions (Signed)
Please return in December for your annual complete physical; please come fasting.  If you have any questions or concerns, please don't hesitate to send me a message via MyChart or call the office at [phone]. Thank you for visiting with [place] today! It's our pleasure caring for you.

## 2019-04-15 NOTE — Progress Notes (Signed)
Virtual Visit via Video Note  Subjective  CC:  Chief Complaint  Patient presents with  . Restless Leg Syndrome    Bilateral, currently taking 3 mg ropinirole daily and reports that symptoms have gotten worse since surgery.. Feeling more jerky     I connected with [name] on 04/15/19 at  3:20 PM EDT by a video enabled telemedicine application and verified that I am speaking with the correct person using two identifiers. Location patient: Home Location provider: [facility] [place], Office Persons participating in the virtual visit: Veronica Finley, Leamon Arnt, MD Lilli Light, [place] discussed the limitations of evaluation and management by telemedicine and the availability of in person appointments. The patient expressed understanding and agreed to proceed. HPI: Veronica Finley is a 68 y.o. female who was contacted today to address the problems listed above in the chief complaint. . Postop right knee replacement 1 week ago and doing very well. However, has had increase in RLS sxs. Prior to surgery was moderately well controlled but over the last week, not good. In part due to surgery (anesthesia) and post op anemia. She is on iron therapy now. Ropinirole has been helpful for years; takes 3mg  nightly. Sometimes doesn't last the entire night.  Marland Kitchen HTN f/u: Feeling well. Taking medications w/o adverse effects. No symptoms of CHF, angina; no palpitations, sob, cp or lower extremity edema. Compliant with meds.  . Depression remains controlled. On meds. No sxs.  Lab Results  Component Value Date   WBC 16.5 (H) 04/04/2019   HGB 10.6 (L) 04/04/2019   HCT 33.0 (L) 04/04/2019   MCV 93.2 04/04/2019   PLT 234 04/04/2019    Assessment  1. Restless legs syndrome (RLS)   2. Status post right knee replacement   3. Essential hypertension   4. Major depression, chronic   5. Postoperative anemia      Plan   RLS:  Worsening, multifactorial. Increase  ropirinole to 4mg  nightly and treat iron deficiency post op anemia. F/u if not improving.   S/p knee replacement: recovering well per ortho  Depression and HTN are well controlled. Continue same meds.  I discussed the assessment and treatment plan with the patient. The patient was provided an opportunity to ask questions and all were answered. The patient agreed with the plan and demonstrated an understanding of the instructions.   The patient was advised to call back or seek an in-person evaluation if the symptoms worsen or if the condition fails to improve as anticipated. Follow up: Return in about 6 months (around 10/16/2019) for complete physical.  Visit date not found  Meds ordered this encounter  Medications  . rOPINIRole (REQUIP) 4 MG tablet    Sig: Take 1 tablet (4 mg total) by mouth at bedtime.    Dispense:  90 tablet    Refill:  1      I reviewed the patients updated PMH, FH, and SocHx.    Patient Active Problem List   Diagnosis Date Noted  . OSA on CPAP 10/26/2018    Priority: High  . Primary osteoarthritis of knees, bilateral 07/28/2018    Priority: High  . Hx of laparoscopic gastric banding+ truncal vagotomy 03/24/2006 01/10/2014    Priority: High  . Lumbar spinal stenosis 07/08/2013    Priority: High  . Major depression, chronic 07/08/2013    Priority: High  . Essential hypertension 04/06/2012    Priority: High  . Acquired  hypothyroidism 10/08/2011    Priority: High  . Mixed hyperlipidemia 10/08/2011    Priority: High  . Obesity (BMI 30-39.9) 12/17/2010    Priority: High  . Restless legs syndrome (RLS) 12/20/2010    Priority: Medium  . Osteoarthritis, multiple sites 12/17/2010    Priority: Medium  . Generalized hyperhidrosis 07/15/2016    Priority: Low  . AR (allergic rhinitis) 12/20/2010    Priority: Low  . Osteoarthritis of right knee 04/02/2019  . S/P knee replacement 04/02/2019  . Arthritis 01/25/2019  . Contact dermatitis due to chemicals  01/25/2019   Current Meds  Medication Sig  . aspirin EC 325 MG tablet Take 1 tablet (325 mg total) by mouth 2 (two) times a day.  . Cholecalciferol (VITAMIN D) 2000 units tablet Take 2,000 Units by mouth daily.   Marland Kitchen diltiazem (CARTIA XT) 240 MG 24 hr capsule Take 1 capsule (240 mg total) by mouth daily.  . ferrous sulfate 325 (65 FE) MG tablet Take 1 tablet (325 mg total) by mouth 3 (three) times daily after meals.  Marland Kitchen FLUoxetine (PROZAC) 20 MG capsule Take 1 capsule (20 mg total) by mouth daily.  Marland Kitchen levothyroxine (SYNTHROID, LEVOTHROID) 175 MCG tablet Take 1 tablet (175 mcg total) by mouth daily before breakfast.  . losartan-hydrochlorothiazide (HYZAAR) 100-25 MG tablet Take 1 tablet by mouth daily.  . rosuvastatin (CRESTOR) 10 MG tablet Take 1 tablet (10 mg total) by mouth at bedtime.  . [DISCONTINUED] rOPINIRole (REQUIP) 3 MG tablet Take 1 tablet (3 mg total) by mouth at bedtime.    Allergies: Patient is allergic to codeine and thimerosal. Family History: Patient family history includes Arthritis in her brother, mother, and sister; Atrial fibrillation in her mother; Diabetes in her brother; Heart disease in her father; Hyperlipidemia in her brother and mother; Hypertension in her mother; Leukemia in her father. Social History:  Patient  reports that she has never smoked. She has never used smokeless tobacco. She reports current alcohol use. She reports that she does not use drugs.  Review of Systems: Constitutional: Negative for fever malaise or anorexia Cardiovascular: negative for chest pain Respiratory: negative for SOB or persistent cough Gastrointestinal: negative for abdominal pain  OBJECTIVE Vitals: There were no vitals taken for this visit. General: no acute distress , A&Ox3 Appears well Leamon Arnt, MD

## 2019-04-16 NOTE — Discharge Summary (Signed)
Physician Discharge Summary  Patient ID: Veronica Finley MRN: [id number] DOB/AGE: 23-Oct-1951 68 y.o.  Admit date: 04/02/2019 Discharge date: 04/16/2019  Admission Diagnoses: right knee OA  Discharge Diagnoses:  Active Problems:   Osteoarthritis of right knee   S/P knee replacement   Discharged Condition: good  Hospital Course:  As documented in chart  Consults:n/a  Significant Diagnostic Studies: routine  Treatments:routine  Discharge Exam: Blood pressure 114/63, pulse 61, temperature 98.2 F (36.8 C), temperature source Oral, resp. rate 16, height 5\' 6"  (1.676 m), weight 112.3 kg, SpO2 91 %.   Disposition:   Discharge Instructions    Call MD / Call 911   Complete by:  As directed    If you experience chest pain or shortness of breath, CALL 911 and be transported to the hospital emergency room.  If you develope a fever above 101 F, pus (white drainage) or increased drainage or redness at the wound, or calf pain, call your surgeon's office.   Constipation Prevention   Complete by:  As directed    Drink plenty of fluids.  Prune juice may be helpful.  You may use a stool softener, such as Colace (over the counter) 100 mg twice a day.  Use MiraLax (over the counter) for constipation as needed.   Diet - low sodium heart healthy   Complete by:  As directed    Discharge instructions   Complete by:  As directed    INSTRUCTIONS AFTER JOINT REPLACEMENT   Remove items at home which could result in a fall. This includes throw rugs or furniture in walking pathways ICE to the affected joint every three hours while awake for 30 minutes at a time, for at least the first 3-5 days, and then as needed for pain and swelling.  Continue to use ice for pain and swelling. You may notice swelling that will progress down to the foot and ankle.  This is normal after surgery.  Elevate your leg when you are not up walking on it.   Continue to use the breathing machine you got in the hospital  (incentive spirometer) which will help keep your temperature down.  It is common for your temperature to cycle up and down following surgery, especially at night when you are not up moving around and exerting yourself.  The breathing machine keeps your lungs expanded and your temperature down.   DIET:  As you were doing prior to hospitalization, we recommend a well-balanced diet.  DRESSING / WOUND CARE / SHOWERING  Keep the surgical dressing until follow up.  The dressing is water proof, so you can shower without any extra covering.  IF THE DRESSING FALLS OFF or the wound gets wet inside, change the dressing with sterile gauze.  Please use good hand washing techniques before changing the dressing.  Do not use any lotions or creams on the incision until instructed by your surgeon.    ACTIVITY  Increase activity slowly as tolerated, but follow the weight bearing instructions below.   No driving for 6 weeks or until further direction given by your physician.  You cannot drive while taking narcotics.  No lifting or carrying greater than 10 lbs. until further directed by your surgeon. Avoid periods of inactivity such as sitting longer than an hour when not asleep. This helps prevent blood clots.  You may return to work once you are authorized by your doctor.     WEIGHT BEARING   Weight bearing as tolerated with assist device (walker,  cane, etc) as directed, use it as long as suggested by your surgeon or therapist, typically at least 4-6 weeks.   EXERCISES  Results after joint replacement surgery are often greatly improved when you follow the exercise, range of motion and muscle strengthening exercises prescribed by your doctor. Safety measures are also important to protect the joint from further injury. Any time any of these exercises cause you to have increased pain or swelling, decrease what you are doing until you are comfortable again and then slowly increase them. If you have problems or  questions, call your caregiver or physical therapist for advice.   Rehabilitation is important following a joint replacement. After just a few days of immobilization, the muscles of the leg can become weakened and shrink (atrophy).  These exercises are designed to build up the tone and strength of the thigh and leg muscles and to improve motion. Often times heat used for twenty to thirty minutes before working out will loosen up your tissues and help with improving the range of motion but do not use heat for the first two weeks following surgery (sometimes heat can increase post-operative swelling).   These exercises can be done on a training (exercise) mat, on the floor, on a table or on a bed. Use whatever works the best and is most comfortable for you.    Use music or television while you are exercising so that the exercises are a pleasant break in your day. This will make your life better with the exercises acting as a break in your routine that you can look forward to.   Perform all exercises about fifteen times, three times per day or as directed.  You should exercise both the operative leg and the other leg as well.   Exercises include:   Quad Sets - Tighten up the muscle on the front of the thigh (Quad) and hold for 5-10 seconds.   Straight Leg Raises - With your knee straight (if you were given a brace, keep it on), lift the leg to 60 degrees, hold for 3 seconds, and slowly lower the leg.  Perform this exercise against resistance later as your leg gets stronger.  Leg Slides: Lying on your back, slowly slide your foot toward your buttocks, bending your knee up off the floor (only go as far as is comfortable). Then slowly slide your foot back down until your leg is flat on the floor again.  Angel Wings: Lying on your back spread your legs to the side as far apart as you can without causing discomfort.  Hamstring Strength:  Lying on your back, push your heel against the floor with your leg straight  by tightening up the muscles of your buttocks.  Repeat, but this time bend your knee to a comfortable angle, and push your heel against the floor.  You may put a pillow under the heel to make it more comfortable if necessary.   A rehabilitation program following joint replacement surgery can speed recovery and prevent re-injury in the future due to weakened muscles. Contact your doctor or a physical therapist for more information on knee rehabilitation.    CONSTIPATION  Constipation is defined medically as fewer than three stools per week and severe constipation as less than one stool per week.  Even if you have a regular bowel pattern at home, your normal regimen is likely to be disrupted due to multiple reasons following surgery.  Combination of anesthesia, postoperative narcotics, change in appetite and fluid intake  all can affect your bowels.   YOU MUST use at least one of the following options; they are listed in order of increasing strength to get the job done.  They are all available over the counter, and you may need to use some, POSSIBLY even all of these options:    Drink plenty of fluids (prune juice may be helpful) and high fiber foods Colace 100 mg by mouth twice a day  Senokot for constipation as directed and as needed Dulcolax (bisacodyl), take with full glass of water  Miralax (polyethylene glycol) once or twice a day as needed.  If you have tried all these things and are unable to have a bowel movement in the first 3-4 days after surgery call either your surgeon or your primary doctor.    If you experience loose stools or diarrhea, hold the medications until you stool forms back up.  If your symptoms do not get better within 1 week or if they get worse, check with your doctor.  If you experience "the worst abdominal pain ever" or develop nausea or vomiting, please contact the office immediately for further recommendations for treatment.   ITCHING:  If you experience itching with  your medications, try taking only a single pain pill, or even half a pain pill at a time.  You can also use Benadryl over the counter for itching or also to help with sleep.   TED HOSE STOCKINGS:  Use stockings on both legs until for at least 2 weeks or as directed by physician office. They may be removed at night for sleeping.  MEDICATIONS:  See your medication summary on the "After Visit Summary" that nursing will review with you.  You may have some home medications which will be placed on hold until you complete the course of blood thinner medication.  It is important for you to complete the blood thinner medication as prescribed.  PRECAUTIONS:  If you experience chest pain or shortness of breath - call 911 immediately for transfer to the hospital emergency department.   If you develop a fever greater that 101 F, purulent drainage from wound, increased redness or drainage from wound, foul odor from the wound/dressing, or calf pain - CONTACT YOUR SURGEON.                                                   FOLLOW-UP APPOINTMENTS:  If you do not already have a post-op appointment, please call the office for an appointment to be seen by your surgeon.  Guidelines for how soon to be seen are listed in your "After Visit Summary", but are typically between 1-4 weeks after surgery.  OTHER INSTRUCTIONS:   Knee Replacement:  Do not place pillow under knee, focus on keeping the knee straight while resting. CPM instructions: 0-90 degrees, 2 hours in the morning, 2 hours in the afternoon, and 2 hours in the evening. Place foam block, curve side up under heel at all times except when in CPM or when walking.  DO NOT modify, tear, cut, or change the foam block in any way.  MAKE SURE YOU:  Understand these instructions.  Get help right away if you are not doing well or get worse.    Thank you for letting us be a part of your medical care team.  It is a privilege we respect  greatly.  We hope these instructions  will help you stay on track for a fast and full recovery!   Increase activity slowly as tolerated   Complete by:  As directed      Allergies as of 04/04/2019      Reactions   Codeine Other (See Comments)   "makes pass out"   Thimerosal Itching      Medication List    STOP taking these medications   ibuprofen 200 MG tablet Commonly known as:  ADVIL   meloxicam 15 MG tablet Commonly known as:  MOBIC   naproxen sodium 220 MG tablet Commonly known as:  ALEVE     TAKE these medications   aspirin EC 325 MG tablet Take 1 tablet (325 mg total) by mouth 2 (two) times a day.   diltiazem 240 MG 24 hr capsule Commonly known as:  Cartia XT Take 1 capsule (240 mg total) by mouth daily.   ferrous sulfate 325 (65 FE) MG tablet Take 1 tablet (325 mg total) by mouth 3 (three) times daily after meals.   FLUoxetine 20 MG capsule Commonly known as:  PROZAC Take 1 capsule (20 mg total) by mouth daily.   levothyroxine 175 MCG tablet Commonly known as:  SYNTHROID Take 1 tablet (175 mcg total) by mouth daily before breakfast.   losartan-hydrochlorothiazide 100-25 MG tablet Commonly known as:  HYZAAR Take 1 tablet by mouth daily.   methocarbamol 500 MG tablet Commonly known as:  Robaxin Take 1 tablet (500 mg total) by mouth every 8 (eight) hours as needed for muscle spasms.   rosuvastatin 10 MG tablet Commonly known as:  CRESTOR Take 1 tablet (10 mg total) by mouth at bedtime.   Vitamin D 50 MCG (2000 UT) tablet Take 2,000 Units by mouth daily.     ASK your doctor about these medications   oxyCODONE 5 MG immediate release tablet Commonly known as:  Roxicodone Take 1 tablet (5 mg total) by mouth every 4 (four) hours as needed for up to 7 days. Ask about: Should I take this medication?      Follow-up Information    [facility] Follow up.   Why:  [facility] will call to arrange appointment Contact information: [address] [phone]          Signed: Cynda Familia 04/16/2019, 10:39 AM

## 2019-06-15 ENCOUNTER — Other Ambulatory Visit: Payer: Self-pay | Admitting: Family Medicine

## 2019-07-05 ENCOUNTER — Encounter: Payer: Self-pay | Admitting: Family Medicine

## 2019-07-07 ENCOUNTER — Encounter: Payer: Self-pay | Admitting: Family Medicine

## 2019-07-19 ENCOUNTER — Encounter: Payer: Self-pay | Admitting: Family Medicine

## 2019-08-11 NOTE — H&P (Signed)
TOTAL KNEE ADMISSION H&P  Patient is being admitted for left total knee arthroplasty.  Subjective:  Chief Complaint:left knee pain.  HPI: Veronica Finley, 68 y.o. female, has a history of pain and functional disability in the left knee due to arthritis and has failed non-surgical conservative treatments for greater than 12 weeks to includeNSAID's and/or analgesics, corticosteriod injections, viscosupplementation injections, flexibility and strengthening excercises and use of assistive devices.  Onset of symptoms was gradual, starting 2 years ago with gradually worsening course since that time. The patient noted prior procedures on the knee to include  arthroscopy and menisectomy on the left knee(s).  Patient currently rates pain in the left knee(s) at 6 out of 10 with activity. Patient has night pain, worsening of pain with activity and weight bearing, pain that interferes with activities of daily living, pain with passive range of motion, crepitus and joint swelling.  Patient has evidence of subchondral cysts, periarticular osteophytes and joint space narrowing by imaging studies. This patient has had no previous injury. There is no active infection.  Patient Active Problem List   Diagnosis Date Noted  . Osteoarthritis of right knee 04/02/2019  . S/P knee replacement 04/02/2019  . Arthritis 01/25/2019  . Contact dermatitis due to chemicals 01/25/2019  . OSA on CPAP 10/26/2018  . Primary osteoarthritis of knees, bilateral 07/28/2018  . Generalized hyperhidrosis 07/15/2016  . Hx of laparoscopic gastric banding+ truncal vagotomy 03/24/2006 01/10/2014  . Lumbar spinal stenosis 07/08/2013  . Major depression, chronic 07/08/2013  . Essential hypertension 04/06/2012  . Acquired hypothyroidism 10/08/2011  . Mixed hyperlipidemia 10/08/2011  . AR (allergic rhinitis) 12/20/2010  . Restless legs syndrome (RLS) 12/20/2010  . Obesity (BMI 30-39.9) 12/17/2010  . Osteoarthritis, multiple sites  12/17/2010   Past Medical History:  Diagnosis Date  . Arthritis   . Depression   . Facial twitching   . Hyperlipidemia   . Hypertension   . Hypothyroidism   . Lumbar spinal stenosis 07/08/2013  . Murmur, cardiac    per cardiologist she saw before having lap band surgery that she had a mild leaky valve and a mild murmur   . OSA on CPAP 10/26/2018    Past Surgical History:  Procedure Laterality Date  . GASTRIC BYPASS    . KNEE ARTHROSCOPY    . SPINE SURGERY    . TOTAL KNEE ARTHROPLASTY Right 04/02/2019   Procedure: TOTAL KNEE ARTHROPLASTY;  Surgeon: Sydnee Cabal, MD;  Location: [facility];  Service: Orthopedics;  Laterality: Right;  spinal plus adductor canal  . TUBAL LIGATION    . UVULECTOMY      No current facility-administered medications for this encounter.    Current Outpatient Medications  Medication Sig Dispense Refill Last Dose  . acetaminophen (TYLENOL) 500 MG tablet Take 500-1,000 mg by mouth every 6 (six) hours as needed (for pain.).     Marland Kitchen Cholecalciferol (VITAMIN D) 2000 units tablet Take 2,000 Units by mouth every evening.      . diltiazem (CARTIA XT) 240 MG 24 hr capsule Take 1 capsule (240 mg total) by mouth daily. 90 capsule 3   . FLUoxetine (PROZAC) 20 MG capsule Take 1 capsule (20 mg total) by mouth daily. 90 capsule 3   . levothyroxine (SYNTHROID, LEVOTHROID) 175 MCG tablet Take 1 tablet (175 mcg total) by mouth daily before breakfast. 90 tablet 3   . losartan-hydrochlorothiazide (HYZAAR) 100-25 MG tablet Take 1 tablet by mouth daily. 90 tablet 3   . meloxicam (MOBIC) 15 MG tablet Take  15 mg by mouth daily.     Marland Kitchen rOPINIRole (REQUIP XL) 4 MG 24 hr tablet Take 4 mg by mouth at bedtime.     . rosuvastatin (CRESTOR) 10 MG tablet TAKE ONE TABLET BY MOUTH EVERY NIGHT AT BEDTIME (Patient taking differently: Take 10 mg by mouth at bedtime. ) 90 tablet 1   . aspirin EC 325 MG tablet Take 1 tablet (325 mg total) by mouth 2 (two) times a day. (Patient not taking: Reported on  08/09/2019) 20 tablet 0 Not Taking at Unknown time  . ferrous sulfate 325 (65 FE) MG tablet Take 1 tablet (325 mg total) by mouth 3 (three) times daily after meals. (Patient not taking: Reported on 08/09/2019) 45 tablet 0 Not Taking at Unknown time  . methocarbamol (ROBAXIN) 500 MG tablet Take 1 tablet (500 mg total) by mouth every 8 (eight) hours as needed for muscle spasms. (Patient not taking: Reported on 04/15/2019) 50 tablet 1    Allergies  Allergen Reactions  . Codeine Other (See Comments)    "makes pass out"  . Thimerosal Itching    Social History   Tobacco Use  . Smoking status: Never Smoker  . Smokeless tobacco: Never Used  Substance Use Topics  . Alcohol use: Yes    Comment: rarely    Family History  Problem Relation Age of Onset  . Arthritis Mother   . Atrial fibrillation Mother   . Hyperlipidemia Mother   . Hypertension Mother   . Leukemia Father   . Heart disease Father   . Arthritis Sister   . Arthritis Brother   . Diabetes Brother   . Hyperlipidemia Brother   . Allergic rhinitis Neg Hx   . Angioedema Neg Hx   . Asthma Neg Hx   . Eczema Neg Hx   . Immunodeficiency Neg Hx   . Urticaria Neg Hx      Review of Systems  Constitutional: Negative.  Negative for chills, fever, malaise/fatigue and weight loss.  HENT: Negative.  Negative for ear pain, hearing loss and sore throat.   Eyes: Negative.   Respiratory: Negative.  Negative for cough, shortness of breath and wheezing.   Cardiovascular: Negative.  Negative for chest pain, palpitations and leg swelling.  Gastrointestinal: Negative.  Negative for abdominal pain, blood in stool, constipation, diarrhea, nausea and vomiting.  Genitourinary: Negative.  Negative for frequency and urgency.  Musculoskeletal: Positive for joint pain. Negative for back pain and myalgias.  Skin: Negative.  Negative for itching and rash.  Neurological: Negative.  Negative for dizziness, tremors, focal weakness, loss of consciousness,  weakness and headaches.  Endo/Heme/Allergies: Negative.   Psychiatric/Behavioral: Negative.     Objective:  Physical Exam  Constitutional: She is oriented to person, place, and time. She appears well-developed and well-nourished. No distress.  HENT:  Head: Normocephalic and atraumatic.  Eyes: EOM are normal. Right eye exhibits no discharge. Left eye exhibits no discharge.  Neck: Normal range of motion. Neck supple.  Cardiovascular: Normal rate, regular rhythm, normal heart sounds and intact distal pulses. Exam reveals no gallop and no friction rub.  No murmur heard. Respiratory: Effort normal and breath sounds normal. No respiratory distress. She has no wheezes. She has no rales. She exhibits no tenderness.  GI: Soft. She exhibits no distension and no mass. There is no abdominal tenderness. There is no rebound and no guarding.  Musculoskeletal:     Left knee: She exhibits decreased range of motion, abnormal alignment and bony tenderness. She exhibits no  swelling, no effusion, no ecchymosis, no deformity, no laceration, no erythema, no LCL laxity, normal meniscus and no MCL laxity. Tenderness found. Medial joint line and lateral joint line tenderness noted. No patellar tendon tenderness noted.  Lymphadenopathy:    She has no cervical adenopathy.    She has no axillary adenopathy.  Neurological: She is alert and oriented to person, place, and time.  Skin: Skin is warm and dry. She is not diaphoretic.  Psychiatric: She has a normal mood and affect. Her behavior is normal. Judgment and thought content normal.    Vital signs in last 24 hours: BP: ()/()  Arterial Line BP: ()/()   Labs:   Estimated body mass index is 39.95 kg/m as calculated from the following:   Height as of 04/02/19: 5\' 6"  (1.676 m).   Weight as of 04/02/19: 112.3 kg.   Imaging Review Plain radiographs demonstrate moderate degenerative joint disease of the left knee(s). The overall alignment ismild valgus. The bone  quality appears to be fair for age and reported activity level.      Assessment/Plan:  End stage arthritis, left knee   The patient history, physical examination, clinical judgment of the provider and imaging studies are consistent with end stage degenerative joint disease of the left knee(s) and total knee arthroplasty is deemed medically necessary. The treatment options including medical management, injection therapy arthroscopy and arthroplasty were discussed at length. The risks and benefits of total knee arthroplasty were presented and reviewed. The risks due to aseptic loosening, infection, stiffness, patella tracking problems, thromboembolic complications and other imponderables were discussed. The patient acknowledged the explanation, agreed to proceed with the plan and consent was signed. Patient is being admitted for inpatient treatment for surgery, pain control, PT, OT, prophylactic antibiotics, VTE prophylaxis, progressive ambulation and ADL's and discharge planning. The patient is planning to be discharged home with home health services

## 2019-08-11 NOTE — Patient Instructions (Addendum)
DUE TO COVID-19 ONLY ONE VISITOR IS ALLOWED TO COME WITH YOU AND STAY IN THE WAITING ROOM ONLY DURING PRE OP AND PROCEDURE DAY OF SURGERY. THE 1 VISITOR MAY VISIT WITH YOU AFTER SURGERY IN YOUR PRIVATE ROOM DURING VISITING HOURS ONLY!  YOU NEED TO HAVE A COVID 19 TEST ON__Tuesday 09/29/2020____ @_11 :05am_____, THIS TEST MUST BE DONE BEFORE SURGERY, COME  [place] , [id number].  ([place]) ONCE YOUR COVID TEST IS COMPLETED, PLEASE BEGIN THE QUARANTINE INSTRUCTIONS AS OUTLINED IN YOUR HANDOUT.                SABRE CORMAN    Your procedure is scheduled on: Friday 08/20/2019   Report to [facility] Main  Entrance              Report to admitting at  0530 AM                Please bring CPAP mask and tubing with you to the hospital!    Call this number if you have problems the morning of surgery [phone]    Remember: Do not eat food  :After Midnight.              NO SOLID FOOD AFTER MIDNIGHT THE NIGHT PRIOR TO SURGERY. NOTHING BY MOUTH EXCEPT CLEAR LIQUIDS UNTIL 0430 am .                PLEASE FINISH ENSURE DRINK PER SURGEON ORDER  WHICH NEEDS TO BE COMPLETED AT 0430 am .   CLEAR LIQUID DIET   Foods Allowed                                                                     Foods Excluded  Coffee and tea, regular and decaf                             liquids that you cannot  Plain Jell-O any favor except red or purple                                           see through such as: Fruit ices (not with fruit pulp)                                     milk, soups, orange juice  Iced Popsicles                                    All solid food Carbonated beverages, regular and diet                                    Cranberry, grape and apple juices Sports drinks like Gatorade Lightly seasoned clear broth or consume(fat free) Sugar, honey syrup  Sample Menu Breakfast  Lunch                                      Supper Cranberry juice                    Beef broth                            Chicken broth Jell-O                                     Grape juice                           Apple juice Coffee or tea                        Jell-O                                      Popsicle                                                Coffee or tea                        Coffee or tea  _____________________________________________________________________                BRUSH YOUR TEETH MORNING OF SURGERY AND RINSE YOUR MOUTH OUT, NO CHEWING GUM CANDY OR MINTS.     Take these medicines the morning of surgery with A SIP OF WATER: Diltiazem (Cartia XT), Fluoxetine (Prozac), Levothyroxine (Synthroid)                                 You may not have any metal on your body including hair pins and              piercings  Do not wear jewelry, make-up, lotions, powders or perfumes, deodorant             Do not wear nail polish.  Do not shave  48 hours prior to surgery.                Do not bring valuables to the hospital. [place].  Contacts, dentures or bridgework may not be worn into surgery.  Leave suitcase in the car. After surgery it may be brought to your room.                  Please read over the following fact sheets you were given: _____________________________________________________________________             [facility] - Preparing for Surgery Before surgery, you can play an important role.  Because skin is not sterile, your skin needs to be as free of germs as possible.  You can reduce the number of  germs on your skin by washing with CHG (chlorahexidine gluconate) soap before surgery.  CHG is an antiseptic cleaner which kills germs and bonds with the skin to continue killing germs even after washing. Please DO NOT use if you have an allergy to CHG or antibacterial soaps.  If your skin becomes reddened/irritated stop using the  CHG and inform your nurse when you arrive at Short Stay. Do not shave (including legs and underarms) for at least 48 hours prior to the first CHG shower.  You may shave your face/neck. Please follow these instructions carefully:  1.  Shower with CHG Soap the night before surgery and the  morning of Surgery.  2.  If you choose to wash your hair, wash your hair first as usual with your  normal  shampoo.  3.  After you shampoo, rinse your hair and body thoroughly to remove the  shampoo.                           4.  Use CHG as you would any other liquid soap.  You can apply chg directly  to the skin and wash                       Gently with a scrungie or clean washcloth.  5.  Apply the CHG Soap to your body ONLY FROM THE NECK DOWN.   Do not use on face/ open                           Wound or open sores. Avoid contact with eyes, ears mouth and genitals (private parts).                       Wash face,  Genitals (private parts) with your normal soap.             6.  Wash thoroughly, paying special attention to the area where your surgery  will be performed.  7.  Thoroughly rinse your body with warm water from the neck down.  8.  DO NOT shower/wash with your normal soap after using and rinsing off  the CHG Soap.                9.  Pat yourself dry with a clean towel.            10.  Wear clean pajamas.            11.  Place clean sheets on your bed the night of your first shower and do not  sleep with pets. Day of Surgery : Do not apply any lotions/deodorants the morning of surgery.  Please wear clean clothes to the hospital/surgery center.  FAILURE TO FOLLOW THESE INSTRUCTIONS MAY RESULT IN THE CANCELLATION OF YOUR SURGERY PATIENT SIGNATURE_________________________________  NURSE SIGNATURE__________________________________  ________________________________________________________________________   [name]  An incentive spirometer is a tool that can help keep your lungs clear  and active. This tool measures how well you are filling your lungs with each breath. Taking long deep breaths may help reverse or decrease the chance of developing breathing (pulmonary) problems (especially infection) following:  A long period of time when you are unable to move or be active. BEFORE THE PROCEDURE   If the spirometer includes an indicator to show your best effort, your nurse or respiratory therapist will set it to a desired goal.  If possible, sit up straight or lean slightly forward. Try not to slouch.  Hold the incentive spirometer in an upright position. INSTRUCTIONS FOR USE  1. Sit on the edge of your bed if possible, or sit up as far as you can in bed or on a chair. 2. Hold the incentive spirometer in an upright position. 3. Breathe out normally. 4. Place the mouthpiece in your mouth and seal your lips tightly around it. 5. Breathe in slowly and as deeply as possible, raising the piston or the ball toward the top of the column. 6. Hold your breath for 3-5 seconds or for as long as possible. Allow the piston or ball to fall to the bottom of the column. 7. Remove the mouthpiece from your mouth and breathe out normally. 8. Rest for a few seconds and repeat Steps 1 through 7 at least 10 times every 1-2 hours when you are awake. Take your time and take a few normal breaths between deep breaths. 9. The spirometer may include an indicator to show your best effort. Use the indicator as a goal to work toward during each repetition. 10. After each set of 10 deep breaths, practice coughing to be sure your lungs are clear. If you have an incision (the cut made at the time of surgery), support your incision when coughing by placing a pillow or rolled up towels firmly against it. Once you are able to get out of bed, walk around indoors and cough well. You may stop using the incentive spirometer when instructed by your caregiver.  RISKS AND COMPLICATIONS  Take your time so you do not  get dizzy or light-headed.  If you are in pain, you may need to take or ask for pain medication before doing incentive spirometry. It is harder to take a deep breath if you are having pain. AFTER USE  Rest and breathe slowly and easily.  It can be helpful to keep track of a log of your progress. Your caregiver can provide you with a simple table to help with this. If you are using the spirometer at home, follow these instructions: [place] IF:   You are having difficultly using the spirometer.  You have trouble using the spirometer as often as instructed.  Your pain medication is not giving enough relief while using the spirometer.  You develop fever of 100.5 F (38.1 C) or higher. SEEK IMMEDIATE MEDICAL CARE IF:   You cough up bloody sputum that had not been present before.  You develop fever of 102 F (38.9 C) or greater.  You develop worsening pain at or near the incision site. MAKE SURE YOU:   Understand these instructions.  Will watch your condition.  Will get help right away if you are not doing well or get worse. Document Released: 03/17/2007 Document Revised: 01/27/2012 Document Reviewed: 05/18/2007 [facility] Patient Information 2014 [place], [place].   ________________________________________________________________________

## 2019-08-12 ENCOUNTER — Encounter (HOSPITAL_COMMUNITY): Payer: Self-pay

## 2019-08-12 ENCOUNTER — Other Ambulatory Visit: Payer: Self-pay

## 2019-08-12 ENCOUNTER — Encounter (HOSPITAL_COMMUNITY)
Admission: RE | Admit: 2019-08-12 | Discharge: 2019-08-12 | Disposition: A | Discharge status: Home / Self Care | Payer: Medicare Other | Source: Ambulatory Visit | Attending: Specialist | Admitting: Specialist

## 2019-08-12 DIAGNOSIS — Z01812 Encounter for preprocedural laboratory examination: Secondary | ICD-10-CM | POA: Insufficient documentation

## 2019-08-12 DIAGNOSIS — M1712 Unilateral primary osteoarthritis, left knee: Secondary | ICD-10-CM | POA: Diagnosis not present

## 2019-08-12 LAB — BASIC METABOLIC PANEL
Anion gap: 9 (ref 5–15)
BUN: 13 mg/dL (ref 8–23)
CO2: 25 mmol/L (ref 22–32)
Calcium: 9.6 mg/dL (ref 8.9–10.3)
Chloride: 105 mmol/L (ref 98–111)
Creatinine, Ser: 0.91 mg/dL (ref 0.44–1.00)
GFR calc Af Amer: >60 mL/min (ref >60)
GFR calc non Af Amer: >60 mL/min (ref >60)
Glucose, Bld: 111 mg/dL — ABNORMAL HIGH (ref 70–99)
Potassium: 4.2 mmol/L (ref 3.5–5.1)
Sodium: 139 mmol/L (ref 135–145)

## 2019-08-12 LAB — CBC
HCT: 43.9 % (ref 36.0–46.0)
Hemoglobin: 14.2 g/dL (ref 12.0–15.0)
MCH: 29.4 pg (ref 26.0–34.0)
MCHC: 32.3 g/dL (ref 30.0–36.0)
MCV: 90.9 fL (ref 80.0–100.0)
Platelets: 306 10*3/uL (ref 150–400)
RBC: 4.83 MIL/uL (ref 3.87–5.11)
RDW: 13.5 % (ref 11.5–15.5)
WBC: 7.7 10*3/uL (ref 4.0–10.5)
nRBC: 0 % (ref 0.0–0.2)

## 2019-08-12 LAB — SURGICAL PCR SCREEN
MRSA, PCR: NEGATIVE
Staphylococcus aureus: POSITIVE — AB

## 2019-08-12 NOTE — Progress Notes (Signed)
PCP - Dr. Billey Chang ([place]) 04-28-2017 Cardiologist - none  Chest x-ray - none EKG - 03/30/2019 Stress Test -  ECHO - none Cardiac Cath - none  Sleep Study - none CPAP - Wears nightly, was asked to bring in her mask and tubing, settings are variable.  Fasting Blood Sugar -  Checks Blood Sugar _0____ times a day  Blood Thinner Instructions: Aspirin Instructions:  NA Last Dose:  Anesthesia review:   Patient denies shortness of breath, fever, cough and chest pain at PAT appointment   Patient verbalized understanding of instructions that were given to them at the PAT appointment. Patient was also instructed that they will need to review over the PAT instructions again at home before surgery.

## 2019-08-17 ENCOUNTER — Other Ambulatory Visit (HOSPITAL_COMMUNITY)
Admission: RE | Admit: 2019-08-17 | Discharge: 2019-08-17 | Disposition: A | Discharge status: Home / Self Care | Payer: Medicare Other | Source: Ambulatory Visit | Attending: Specialist | Admitting: Specialist

## 2019-08-17 DIAGNOSIS — Z01812 Encounter for preprocedural laboratory examination: Secondary | ICD-10-CM | POA: Diagnosis present

## 2019-08-17 DIAGNOSIS — Z20828 Contact with and (suspected) exposure to other viral communicable diseases: Secondary | ICD-10-CM | POA: Insufficient documentation

## 2019-08-18 LAB — NOVEL CORONAVIRUS, NAA (HOSP ORDER, SEND-OUT TO REF LAB; TAT 18-24 HRS): SARS-CoV-2, NAA: NOT DETECTED

## 2019-08-19 ENCOUNTER — Encounter (HOSPITAL_COMMUNITY): Payer: Medicare Other

## 2019-08-19 MED ORDER — VANCOMYCIN HCL 10 G IV SOLR
1500.0000 mg | INTRAVENOUS | Status: AC
  Administered 2019-08-20: 1500 mg via INTRAVENOUS
  Filled 2019-08-19: qty 1500

## 2019-08-20 ENCOUNTER — Encounter (HOSPITAL_COMMUNITY): Payer: Self-pay

## 2019-08-20 ENCOUNTER — Ambulatory Visit (HOSPITAL_COMMUNITY): Payer: Medicare Other | Admitting: Anesthesiology

## 2019-08-20 ENCOUNTER — Observation Stay (HOSPITAL_COMMUNITY)
Admission: AD | Admit: 2019-08-20 | Discharge: 2019-08-22 | Disposition: A | Discharge status: Home / Self Care | Payer: Medicare Other | Attending: Specialist | Admitting: Specialist

## 2019-08-20 ENCOUNTER — Encounter (HOSPITAL_COMMUNITY)
Admission: AD | Disposition: A | Discharge status: Home / Self Care | Payer: Self-pay | Source: Home / Self Care | Attending: Specialist

## 2019-08-20 ENCOUNTER — Ambulatory Visit (HOSPITAL_COMMUNITY): Payer: Medicare Other | Admitting: Physician Assistant

## 2019-08-20 ENCOUNTER — Other Ambulatory Visit: Payer: Self-pay

## 2019-08-20 DIAGNOSIS — Z7982 Long term (current) use of aspirin: Secondary | ICD-10-CM | POA: Diagnosis not present

## 2019-08-20 DIAGNOSIS — X58XXXA Exposure to other specified factors, initial encounter: Secondary | ICD-10-CM | POA: Insufficient documentation

## 2019-08-20 DIAGNOSIS — Z96651 Presence of right artificial knee joint: Secondary | ICD-10-CM | POA: Insufficient documentation

## 2019-08-20 DIAGNOSIS — S8412XA Injury of peroneal nerve at lower leg level, left leg, initial encounter: Secondary | ICD-10-CM | POA: Insufficient documentation

## 2019-08-20 DIAGNOSIS — E669 Obesity, unspecified: Secondary | ICD-10-CM | POA: Diagnosis not present

## 2019-08-20 DIAGNOSIS — Z6838 Body mass index (BMI) 38.0-38.9, adult: Secondary | ICD-10-CM | POA: Diagnosis not present

## 2019-08-20 DIAGNOSIS — Z791 Long term (current) use of non-steroidal anti-inflammatories (NSAID): Secondary | ICD-10-CM | POA: Diagnosis not present

## 2019-08-20 DIAGNOSIS — Z79899 Other long term (current) drug therapy: Secondary | ICD-10-CM | POA: Insufficient documentation

## 2019-08-20 DIAGNOSIS — M1712 Unilateral primary osteoarthritis, left knee: Secondary | ICD-10-CM | POA: Diagnosis not present

## 2019-08-20 DIAGNOSIS — Y838 Other surgical procedures as the cause of abnormal reaction of the patient, or of later complication, without mention of misadventure at the time of the procedure: Secondary | ICD-10-CM | POA: Diagnosis not present

## 2019-08-20 DIAGNOSIS — Z79890 Hormone replacement therapy (postmenopausal): Secondary | ICD-10-CM | POA: Insufficient documentation

## 2019-08-20 DIAGNOSIS — G4733 Obstructive sleep apnea (adult) (pediatric): Secondary | ICD-10-CM | POA: Diagnosis not present

## 2019-08-20 DIAGNOSIS — G2581 Restless legs syndrome: Secondary | ICD-10-CM | POA: Diagnosis not present

## 2019-08-20 DIAGNOSIS — I10 Essential (primary) hypertension: Secondary | ICD-10-CM | POA: Diagnosis not present

## 2019-08-20 DIAGNOSIS — F329 Major depressive disorder, single episode, unspecified: Secondary | ICD-10-CM | POA: Insufficient documentation

## 2019-08-20 DIAGNOSIS — E039 Hypothyroidism, unspecified: Secondary | ICD-10-CM | POA: Diagnosis not present

## 2019-08-20 DIAGNOSIS — Z9884 Bariatric surgery status: Secondary | ICD-10-CM | POA: Insufficient documentation

## 2019-08-20 DIAGNOSIS — E782 Mixed hyperlipidemia: Secondary | ICD-10-CM | POA: Insufficient documentation

## 2019-08-20 HISTORY — PX: TOTAL KNEE ARTHROPLASTY: SHX125

## 2019-08-20 LAB — CBC
HCT: 42.4 % (ref 36.0–46.0)
Hemoglobin: 13.6 g/dL (ref 12.0–15.0)
MCH: 29.4 pg (ref 26.0–34.0)
MCHC: 32.1 g/dL (ref 30.0–36.0)
MCV: 91.6 fL (ref 80.0–100.0)
Platelets: 258 10*3/uL (ref 150–400)
RBC: 4.63 MIL/uL (ref 3.87–5.11)
RDW: 13.6 % (ref 11.5–15.5)
WBC: 9.1 10*3/uL (ref 4.0–10.5)
nRBC: 0 % (ref 0.0–0.2)

## 2019-08-20 LAB — CREATININE, SERUM
Creatinine, Ser: 0.99 mg/dL (ref 0.44–1.00)
GFR calc Af Amer: >60 mL/min (ref >60)
GFR calc non Af Amer: 59 mL/min — ABNORMAL LOW (ref >60)

## 2019-08-20 SURGERY — ARTHROPLASTY, KNEE, TOTAL
Anesthesia: Regional | Site: Knee | Laterality: Left

## 2019-08-20 MED ORDER — FLEET ENEMA 7-19 GM/118ML RE ENEM: 1.0000 | ENEMA | Freq: Once | RECTAL | Status: DC | PRN

## 2019-08-20 MED ORDER — ONDANSETRON HCL 4 MG PO TABS: 4.0000 mg | ORAL_TABLET | Freq: Every day | ORAL | 1 refills | Status: DC | PRN

## 2019-08-20 MED ORDER — HYDROMORPHONE HCL 1 MG/ML IJ SOLN
0.2500 mg | INTRAMUSCULAR | Status: DC | PRN
  Administered 2019-08-20 (×3): 0.5 mg via INTRAVENOUS

## 2019-08-20 MED ORDER — METHOCARBAMOL 500 MG PO TABS: 500.0000 mg | ORAL_TABLET | Freq: Four times a day (QID) | ORAL | 0 refills | Status: DC

## 2019-08-20 MED ORDER — HYDROMORPHONE HCL 1 MG/ML IJ SOLN: 0.5000 mg | INTRAMUSCULAR | Status: DC | PRN

## 2019-08-20 MED ORDER — ASPIRIN EC 325 MG PO TBEC: 325.0000 mg | DELAYED_RELEASE_TABLET | Freq: Two times a day (BID) | ORAL | 0 refills | Status: AC

## 2019-08-20 MED ORDER — SODIUM CHLORIDE (PF) 0.9 % IJ SOLN
INTRAMUSCULAR | Status: AC
  Filled 2019-08-20: qty 50

## 2019-08-20 MED ORDER — PROPOFOL 10 MG/ML IV BOLUS
INTRAVENOUS | Status: DC | PRN
  Administered 2019-08-20: 20 mg via INTRAVENOUS
  Administered 2019-08-20: 30 mg via INTRAVENOUS
  Administered 2019-08-20 (×3): 20 mg via INTRAVENOUS

## 2019-08-20 MED ORDER — BISACODYL 5 MG PO TBEC: 5.0000 mg | DELAYED_RELEASE_TABLET | Freq: Every day | ORAL | Status: DC | PRN

## 2019-08-20 MED ORDER — PROPOFOL 10 MG/ML IV BOLUS
INTRAVENOUS | Status: AC
  Filled 2019-08-20: qty 20

## 2019-08-20 MED ORDER — ONDANSETRON HCL 4 MG/2ML IJ SOLN: 4.0000 mg | Freq: Four times a day (QID) | INTRAMUSCULAR | Status: DC | PRN

## 2019-08-20 MED ORDER — METHOCARBAMOL 500 MG IVPB - SIMPLE MED
INTRAVENOUS | Status: AC
  Filled 2019-08-20: qty 50

## 2019-08-20 MED ORDER — ONDANSETRON HCL 4 MG PO TABS: 4.0000 mg | ORAL_TABLET | Freq: Four times a day (QID) | ORAL | Status: DC | PRN

## 2019-08-20 MED ORDER — ROPIVACAINE HCL 5 MG/ML IJ SOLN
INTRAMUSCULAR | Status: DC | PRN
  Administered 2019-08-20: 20 mL via PERINEURAL

## 2019-08-20 MED ORDER — OXYCODONE HCL ER 10 MG PO T12A
10.0000 mg | EXTENDED_RELEASE_TABLET | Freq: Two times a day (BID) | ORAL | Status: DC
  Administered 2019-08-20 – 2019-08-22 (×4): 10 mg via ORAL
  Filled 2019-08-20 (×4): qty 1

## 2019-08-20 MED ORDER — OXYCODONE HCL 5 MG PO TABS: 5.0000 mg | ORAL_TABLET | ORAL | 0 refills | Status: AC | PRN

## 2019-08-20 MED ORDER — PANTOPRAZOLE SODIUM 40 MG PO TBEC
40.0000 mg | DELAYED_RELEASE_TABLET | Freq: Every day | ORAL | Status: DC
  Administered 2019-08-21 – 2019-08-22 (×2): 40 mg via ORAL
  Filled 2019-08-20 (×2): qty 1

## 2019-08-20 MED ORDER — DEXAMETHASONE SODIUM PHOSPHATE 10 MG/ML IJ SOLN
INTRAMUSCULAR | Status: AC
  Filled 2019-08-20: qty 1

## 2019-08-20 MED ORDER — METHOCARBAMOL 500 MG IVPB - SIMPLE MED
500.0000 mg | Freq: Four times a day (QID) | INTRAVENOUS | Status: DC | PRN
  Administered 2019-08-20: 500 mg via INTRAVENOUS
  Filled 2019-08-20: qty 50

## 2019-08-20 MED ORDER — MIDAZOLAM HCL 5 MG/5ML IJ SOLN
INTRAMUSCULAR | Status: DC | PRN
  Administered 2019-08-20: 2 mg via INTRAVENOUS

## 2019-08-20 MED ORDER — LACTATED RINGERS IV SOLN
INTRAVENOUS | Status: DC
  Administered 2019-08-20 (×3): via INTRAVENOUS

## 2019-08-20 MED ORDER — OXYCODONE HCL 5 MG PO TABS
10.0000 mg | ORAL_TABLET | ORAL | Status: DC | PRN
  Filled 2019-08-20: qty 2

## 2019-08-20 MED ORDER — SODIUM CHLORIDE 0.9 % IV SOLN
INTRAVENOUS | Status: DC
  Administered 2019-08-21: 06:00:00 via INTRAVENOUS

## 2019-08-20 MED ORDER — MIDAZOLAM HCL 2 MG/2ML IJ SOLN
INTRAMUSCULAR | Status: AC
  Filled 2019-08-20: qty 2

## 2019-08-20 MED ORDER — HYDROMORPHONE HCL 1 MG/ML IJ SOLN
INTRAMUSCULAR | Status: AC
  Filled 2019-08-20: qty 1

## 2019-08-20 MED ORDER — SODIUM CHLORIDE 0.9 % IR SOLN
Status: DC | PRN
  Administered 2019-08-20: 1000 mL

## 2019-08-20 MED ORDER — FENTANYL CITRATE (PF) 100 MCG/2ML IJ SOLN
INTRAMUSCULAR | Status: AC
  Filled 2019-08-20: qty 2

## 2019-08-20 MED ORDER — PROPOFOL 10 MG/ML IV BOLUS
INTRAVENOUS | Status: AC
  Filled 2019-08-20: qty 40

## 2019-08-20 MED ORDER — INFLUENZA VAC A&B SA ADJ QUAD 0.5 ML IM PRSY
0.5000 mL | PREFILLED_SYRINGE | INTRAMUSCULAR | Status: DC
  Filled 2019-08-20: qty 0.5

## 2019-08-20 MED ORDER — SODIUM CHLORIDE (PF) 0.9 % IJ SOLN
INTRAMUSCULAR | Status: AC
  Filled 2019-08-20: qty 10

## 2019-08-20 MED ORDER — CELECOXIB 200 MG PO CAPS
200.0000 mg | ORAL_CAPSULE | Freq: Two times a day (BID) | ORAL | Status: DC
  Administered 2019-08-20 – 2019-08-22 (×5): 200 mg via ORAL
  Filled 2019-08-20 (×5): qty 1

## 2019-08-20 MED ORDER — CHLORHEXIDINE GLUCONATE 4 % EX LIQD: 60.0000 mL | Freq: Once | CUTANEOUS | Status: DC

## 2019-08-20 MED ORDER — DIPHENHYDRAMINE HCL 12.5 MG/5ML PO ELIX: 12.5000 mg | ORAL_SOLUTION | ORAL | Status: DC | PRN

## 2019-08-20 MED ORDER — FENTANYL CITRATE (PF) 100 MCG/2ML IJ SOLN
INTRAMUSCULAR | Status: DC | PRN
  Administered 2019-08-20: 100 ug via INTRAVENOUS

## 2019-08-20 MED ORDER — METOCLOPRAMIDE HCL 5 MG PO TABS: 5.0000 mg | ORAL_TABLET | Freq: Three times a day (TID) | ORAL | Status: DC | PRN

## 2019-08-20 MED ORDER — ACETAMINOPHEN 325 MG PO TABS: 325.0000 mg | ORAL_TABLET | Freq: Four times a day (QID) | ORAL | Status: DC | PRN

## 2019-08-20 MED ORDER — DOCUSATE SODIUM 100 MG PO CAPS
100.0000 mg | ORAL_CAPSULE | Freq: Two times a day (BID) | ORAL | Status: DC
  Administered 2019-08-20 – 2019-08-22 (×4): 100 mg via ORAL
  Filled 2019-08-20 (×4): qty 1

## 2019-08-20 MED ORDER — ONDANSETRON HCL 4 MG/2ML IJ SOLN
INTRAMUSCULAR | Status: AC
  Filled 2019-08-20: qty 2

## 2019-08-20 MED ORDER — GABAPENTIN 300 MG PO CAPS
300.0000 mg | ORAL_CAPSULE | Freq: Three times a day (TID) | ORAL | Status: DC
  Administered 2019-08-20 – 2019-08-22 (×7): 300 mg via ORAL
  Filled 2019-08-20 (×7): qty 1

## 2019-08-20 MED ORDER — PROPOFOL 500 MG/50ML IV EMUL
INTRAVENOUS | Status: DC | PRN
  Administered 2019-08-20: 75 ug/kg/min via INTRAVENOUS

## 2019-08-20 MED ORDER — MENTHOL 3 MG MT LOZG: 1.0000 | LOZENGE | OROMUCOSAL | Status: DC | PRN

## 2019-08-20 MED ORDER — POLYETHYLENE GLYCOL 3350 17 G PO PACK: 17.0000 g | PACK | Freq: Every day | ORAL | Status: DC | PRN

## 2019-08-20 MED ORDER — BUPIVACAINE LIPOSOME 1.3 % IJ SUSP
20.0000 mL | Freq: Once | INTRAMUSCULAR | Status: AC
  Administered 2019-08-20: 10:00:00 20 mL
  Filled 2019-08-20: qty 20

## 2019-08-20 MED ORDER — PHENOL 1.4 % MT LIQD: 1.0000 | OROMUCOSAL | Status: DC | PRN

## 2019-08-20 MED ORDER — CEFAZOLIN SODIUM-DEXTROSE 2-4 GM/100ML-% IV SOLN
2.0000 g | Freq: Four times a day (QID) | INTRAVENOUS | Status: AC
  Administered 2019-08-20 (×2): 2 g via INTRAVENOUS
  Filled 2019-08-20 (×2): qty 100

## 2019-08-20 MED ORDER — METOCLOPRAMIDE HCL 5 MG/ML IJ SOLN
5.0000 mg | Freq: Three times a day (TID) | INTRAMUSCULAR | Status: DC | PRN
  Administered 2019-08-20: 10 mg via INTRAVENOUS
  Filled 2019-08-20: qty 2

## 2019-08-20 MED ORDER — METHOCARBAMOL 500 MG PO TABS
500.0000 mg | ORAL_TABLET | Freq: Four times a day (QID) | ORAL | Status: DC | PRN
  Administered 2019-08-21 – 2019-08-22 (×4): 500 mg via ORAL
  Filled 2019-08-20 (×4): qty 1

## 2019-08-20 MED ORDER — OXYCODONE HCL 5 MG PO TABS: 5.0000 mg | ORAL_TABLET | Freq: Once | ORAL | Status: DC | PRN

## 2019-08-20 MED ORDER — OXYCODONE HCL 5 MG PO TABS
5.0000 mg | ORAL_TABLET | ORAL | Status: DC | PRN
  Administered 2019-08-20 (×2): 10 mg via ORAL
  Administered 2019-08-21 – 2019-08-22 (×4): 5 mg via ORAL
  Filled 2019-08-20 (×4): qty 1
  Filled 2019-08-20: qty 2
  Filled 2019-08-20: qty 1

## 2019-08-20 MED ORDER — OXYCODONE HCL 5 MG/5ML PO SOLN: 5.0000 mg | Freq: Once | ORAL | Status: DC | PRN

## 2019-08-20 MED ORDER — TRANEXAMIC ACID-NACL 1000-0.7 MG/100ML-% IV SOLN
1000.0000 mg | INTRAVENOUS | Status: AC
  Administered 2019-08-20: 1000 mg via INTRAVENOUS
  Filled 2019-08-20: qty 100

## 2019-08-20 MED ORDER — PROMETHAZINE HCL 25 MG/ML IJ SOLN: 6.2500 mg | INTRAMUSCULAR | Status: DC | PRN

## 2019-08-20 MED ORDER — SODIUM CHLORIDE (PF) 0.9 % IJ SOLN
INTRAMUSCULAR | Status: DC | PRN
  Administered 2019-08-20: 60 mL

## 2019-08-20 MED ORDER — POVIDONE-IODINE 10 % EX SWAB
2.0000 "application " | Freq: Once | CUTANEOUS | Status: AC
  Administered 2019-08-20: 2 via TOPICAL

## 2019-08-20 MED ORDER — DEXAMETHASONE SODIUM PHOSPHATE 10 MG/ML IJ SOLN
INTRAMUSCULAR | Status: DC | PRN
  Administered 2019-08-20: 10 mg via INTRAVENOUS

## 2019-08-20 MED ORDER — ENOXAPARIN SODIUM 30 MG/0.3ML ~~LOC~~ SOLN
30.0000 mg | Freq: Two times a day (BID) | SUBCUTANEOUS | Status: DC
  Administered 2019-08-21 – 2019-08-22 (×3): 30 mg via SUBCUTANEOUS
  Filled 2019-08-20 (×3): qty 0.3

## 2019-08-20 MED ORDER — ONDANSETRON HCL 4 MG/2ML IJ SOLN
INTRAMUSCULAR | Status: DC | PRN
  Administered 2019-08-20: 4 mg via INTRAVENOUS

## 2019-08-20 MED ORDER — BUPIVACAINE IN DEXTROSE 0.75-8.25 % IT SOLN
INTRATHECAL | Status: DC | PRN
  Administered 2019-08-20: 1.8 mL via INTRATHECAL

## 2019-08-20 SURGICAL SUPPLY — 63 items
ADH SKN CLS APL DERMABOND .7 (GAUZE/BANDAGES/DRESSINGS) ×1
ATTUNE MED DOME PAT 32 KNEE (Knees) ×1 IMPLANT
ATTUNE PS FEM LT SZ 7 CEM KNEE (Femur) ×1 IMPLANT
ATTUNE PSRP INSR SZ7 6 KNEE (Insert) ×1 IMPLANT
BAG DECANTER FOR FLEXI CONT (MISCELLANEOUS) IMPLANT
BAG SPEC THK2 15X12 ZIP CLS (MISCELLANEOUS) ×2
BAG ZIPLOCK 12X15 (MISCELLANEOUS) ×4 IMPLANT
BASE TIBIA ATTUNE KNEE SYS SZ6 (Knees) IMPLANT
BLADE SAG 18X100X1.27 (BLADE) ×2 IMPLANT
BLADE SAW SGTL 11.0X1.19X90.0M (BLADE) ×2 IMPLANT
BNDG ELASTIC 4X5.8 VLCR STR LF (GAUZE/BANDAGES/DRESSINGS) ×2 IMPLANT
BNDG ELASTIC 6X5.8 VLCR STR LF (GAUZE/BANDAGES/DRESSINGS) ×2 IMPLANT
BOWL SMART MIX CTS (DISPOSABLE) ×2 IMPLANT
BSPLAT TIB 6 CMNT ROT PLAT STR (Knees) ×1 IMPLANT
CEMENT HV SMART SET (Cement) ×1 IMPLANT
COVER SURGICAL LIGHT HANDLE (MISCELLANEOUS) ×2 IMPLANT
COVER WAND RF STERILE (DRAPES) IMPLANT
CUFF TOURN SGL QUICK 34 (TOURNIQUET CUFF) ×2
CUFF TRNQT CYL 34X4.125X (TOURNIQUET CUFF) ×1 IMPLANT
DECANTER SPIKE VIAL GLASS SM (MISCELLANEOUS) ×2 IMPLANT
DERMABOND ADVANCED (GAUZE/BANDAGES/DRESSINGS) ×1
DERMABOND ADVANCED .7 DNX12 (GAUZE/BANDAGES/DRESSINGS) ×1 IMPLANT
DRAPE U-SHAPE 47X51 STRL (DRAPES) ×2 IMPLANT
DRSG AQUACEL AG ADV 3.5X10 (GAUZE/BANDAGES/DRESSINGS) ×2 IMPLANT
DRSG TEGADERM 4X4.75 (GAUZE/BANDAGES/DRESSINGS) ×2 IMPLANT
DURAPREP 26ML APPLICATOR (WOUND CARE) ×4 IMPLANT
ELECT REM PT RETURN 15FT ADLT (MISCELLANEOUS) ×2 IMPLANT
EVACUATOR 1/8 PVC DRAIN (DRAIN) ×2 IMPLANT
GAUZE SPONGE 2X2 8PLY STRL LF (GAUZE/BANDAGES/DRESSINGS) ×1 IMPLANT
GLOVE BIOGEL PI IND STRL 8 (GLOVE) ×2 IMPLANT
GLOVE BIOGEL PI INDICATOR 8 (GLOVE) ×2
GLOVE ECLIPSE 8.0 STRL XLNG CF (GLOVE) ×4 IMPLANT
GLOVE SURG ORTHO 9.0 STRL STRW (GLOVE) ×2 IMPLANT
GOWN STRL REUS W/TWL XL LVL3 (GOWN DISPOSABLE) ×4 IMPLANT
HANDPIECE INTERPULSE COAX TIP (DISPOSABLE) ×2
HOLDER FOLEY CATH W/STRAP (MISCELLANEOUS) ×1 IMPLANT
KIT TURNOVER KIT A (KITS) ×1 IMPLANT
NS IRRIG 1000ML POUR BTL (IV SOLUTION) ×2 IMPLANT
PACK TOTAL KNEE CUSTOM (KITS) ×2 IMPLANT
PIN DRILL FIX HALF THREAD (BIT) ×1 IMPLANT
PIN STEINMAN FIXATION KNEE (PIN) ×1 IMPLANT
PROTECTOR NERVE ULNAR (MISCELLANEOUS) ×2 IMPLANT
SET HNDPC FAN SPRY TIP SCT (DISPOSABLE) ×1 IMPLANT
SET PAD KNEE POSITIONER (MISCELLANEOUS) ×2 IMPLANT
SPONGE GAUZE 2X2 STER 10/PKG (GAUZE/BANDAGES/DRESSINGS) ×1
SPONGE LAP 18X18 RF (DISPOSABLE) IMPLANT
SPONGE SURGIFOAM ABS GEL 100 (HEMOSTASIS) ×2 IMPLANT
STOCKINETTE 6  STRL (DRAPES) ×1
STOCKINETTE 6 STRL (DRAPES) ×1 IMPLANT
SUT BONE WAX W31G (SUTURE) IMPLANT
SUT MNCRL AB 3-0 PS2 18 (SUTURE) ×2 IMPLANT
SUT VIC AB 1 CT1 27 (SUTURE) ×6
SUT VIC AB 1 CT1 27XBRD ANTBC (SUTURE) ×3 IMPLANT
SUT VIC AB 2-0 CT1 27 (SUTURE) ×4
SUT VIC AB 2-0 CT1 TAPERPNT 27 (SUTURE) ×2 IMPLANT
SUT VLOC 180 0 24IN GS25 (SUTURE) ×2 IMPLANT
SYR 50ML LL SCALE MARK (SYRINGE) IMPLANT
TAPE STRIPS DRAPE STRL (GAUZE/BANDAGES/DRESSINGS) ×2 IMPLANT
TIBIA ATTUNE KNEE SYS BASE SZ6 (Knees) ×2 IMPLANT
TRAY FOLEY MTR SLVR 14FR STAT (SET/KITS/TRAYS/PACK) ×1 IMPLANT
WATER STERILE IRR 1000ML POUR (IV SOLUTION) ×4 IMPLANT
WRAP KNEE MAXI GEL POST OP (GAUZE/BANDAGES/DRESSINGS) ×2 IMPLANT
YANKAUER SUCT BULB TIP 10FT TU (MISCELLANEOUS) ×2 IMPLANT

## 2019-08-20 NOTE — Anesthesia Procedure Notes (Signed)
Date/Time: 08/20/2019 7:42 AM Performed by: Sharlette Dense, CRNA Oxygen Delivery Method: Simple face mask

## 2019-08-20 NOTE — Interval H&P Note (Signed)
History and Physical Interval Note:  08/20/2019 7:35 AM  Veronica Finley  has presented today for surgery, with the diagnosis of Left knee osteoarthritis.  The various methods of treatment have been discussed with the patient and family. After consideration of risks, benefits and other options for treatment, the patient has consented to  Procedure(s) with comments: TOTAL KNEE ARTHROPLASTY (Left) - with adductor canal as a surgical intervention.  The patient's history has been reviewed, patient examined, no change in status, stable for surgery.  I have reviewed the patient's chart and labs.  Questions were answered to the patient's satisfaction.     [name]

## 2019-08-20 NOTE — Evaluation (Signed)
Physical Therapy Evaluation Patient Details Name: Veronica Finley MRN: [id number] DOB: [birth year] Today's Date: 08/20/2019   History of Present Illness  Pt s/p R TKR and with hx of L TKR 5/20  Clinical Impression  Pt s/p R TKR and presents with decreased R LE strength/ROM and post op pain limiting functional mobility.  Pt should progress to dc home alone with follow up HHPT.    Follow Up Recommendations Home health PT    Equipment Recommendations  None recommended by PT    Recommendations for Other Services       Precautions / Restrictions Precautions Precautions: Knee;Fall Restrictions Weight Bearing Restrictions: No Other Position/Activity Restrictions: WBAT      Mobility  Bed Mobility Overal bed mobility: Needs Assistance Bed Mobility: Supine to Sit     Supine to sit: Min assist     General bed mobility comments: cues for sequence and use of L LE to self assist  Transfers Overall transfer level: Needs assistance Equipment used: Rolling walker (2 wheeled) Transfers: Sit to/from Stand Sit to Stand: Min assist         General transfer comment: Cues for LE management and use of UEs to self assist  Ambulation/Gait Ambulation/Gait assistance: Min assist Gait Distance (Feet): 50 Feet Assistive device: Rolling walker (2 wheeled) Gait Pattern/deviations: Step-to pattern;Decreased step length - right;Decreased step length - left;Shuffle;Trunk flexed Gait velocity: decr   General Gait Details: cues for sequence, posture and position from [employer]            Wheelchair Mobility    Modified Rankin (Stroke Patients Only)       Balance Overall balance assessment: Mild deficits observed, not formally tested                                           Pertinent Vitals/Pain Pain Assessment: 0-10 Pain Score: 2  Pain Location: L knee Pain Descriptors / Indicators: Aching;Sore Pain Intervention(s): Limited activity within patient's  tolerance;Premedicated before session;Monitored during session;Ice applied    Home Living Family/patient expects to be discharged to:: Private residence Living Arrangements: Alone Available Help at Discharge: Available PRN/intermittently Type of Home: House Home Access: Stairs to enter Entrance Stairs-Rails: Right Entrance Stairs-Number of Steps: 5 Home Layout: Able to live on main level with bedroom/bathroom Home Equipment: Walker - 2 wheels;Crutches;Shower seat;Bedside commode Additional Comments: 1/2 bath on ground floor, shower is upstairs    Prior Function Level of Independence: Independent               Hand Dominance        Extremity/Trunk Assessment   Upper Extremity Assessment Upper Extremity Assessment: Overall WFL for tasks assessed    Lower Extremity Assessment Lower Extremity Assessment: RLE deficits/detail    Cervical / Trunk Assessment Cervical / Trunk Assessment: Normal  Communication   Communication: No difficulties  Cognition Arousal/Alertness: Awake/alert Behavior During Therapy: WFL for tasks assessed/performed Overall Cognitive Status: Within Functional Limits for tasks assessed                                        General Comments      Exercises Total Joint Exercises Ankle Circles/Pumps: AROM;Both;15 reps;Supine   Assessment/Plan    PT Assessment Patient needs continued PT services  PT Problem List Decreased  strength;Decreased range of motion;Decreased activity tolerance;Decreased mobility;Decreased safety awareness;Obesity;Pain       PT Treatment Interventions DME instruction;Gait training;Stair training;Functional mobility training;Therapeutic activities;Therapeutic exercise;Patient/family education    PT Goals (Current goals can be found in the Care Plan section)  Acute Rehab PT Goals Patient Stated Goal: Regain IND PT Goal Formulation: With patient Time For Goal Achievement: 09/03/19 Potential to Achieve  Goals: Good    Frequency 7X/week   Barriers to discharge Decreased caregiver support home alone    Co-evaluation               AM-PAC PT "6 Clicks" Mobility  Outcome Measure Help needed turning from your back to your side while in a flat bed without using bedrails?: A Little Help needed moving from lying on your back to sitting on the side of a flat bed without using bedrails?: A Little Help needed moving to and from a bed to a chair (including a wheelchair)?: A Little Help needed standing up from a chair using your arms (e.g., wheelchair or bedside chair)?: A Little Help needed to walk in hospital room?: A Little Help needed climbing 3-5 steps with a railing? : A Lot 6 Click Score: 17    End of Session Equipment Utilized During Treatment: Gait belt Activity Tolerance: Patient tolerated treatment well Patient left: in chair;with call bell/phone within reach;with chair alarm set;with nursing/sitter in room Nurse Communication: Mobility status PT Visit Diagnosis: Difficulty in walking, not elsewhere classified (R26.2)    Time: 1415-1440 PT Time Calculation (min) (ACUTE ONLY): 25 min   Charges:   PT Evaluation $PT Eval Low Complexity: 1 Low PT Treatments $Gait Training: 8-22 mins        [place] Pager [phone] Office [phone]   Lavonne Kinderman 08/20/2019, 2:50 PM

## 2019-08-20 NOTE — Care Management CC44 (Signed)
Condition Code 44 Documentation Completed  Patient Details  Name: Veronica Finley MRN: [id number] Date of Birth: 10-01-1951   Condition Code 44 given:  Yes Patient signature on Condition Code 44 notice:  Yes Documentation of 2 MD's agreement:  Yes Code 44 added to claim:  Yes    Lia Hopping, LCSW 08/20/2019, 3:41 PM

## 2019-08-20 NOTE — Op Note (Signed)
DATE OF SURGERY:  08/20/2019  TIME: 9:49 AM  PATIENT NAME:  Veronica Finley    AGE: 68 y.o.   PRE-OPERATIVE DIAGNOSIS:  Left knee osteoarthritis  POST-OPERATIVE DIAGNOSIS:  Left knee osteoarthritis  PROCEDURE:  Procedure(s): TOTAL KNEE ARTHROPLASTY  SURGEON:  Braylin Xu ANDREW  ASSISTANT:  Leeanne Haus, PA-C, present and scrubbed throughout the case, critical for assistance with exposure, retraction, instrumentation, and closure.  OPERATIVE IMPLANTS: Depuy PFC Attune Rotating Platform.  Femur size 7, Tibia size 6, Patella size 32 3-peg oval button, with a 6 mm polyethylene insert.   PREOPERATIVE INDICATIONS:   Veronica Finley is a 68 y.o. year old female with end stage bone on bone arthritis of the knee who failed conservative treatment and elected for Total Knee Arthroplasty.   The risks, benefits, and alternatives were discussed at length including but not limited to the risks of infection, bleeding, nerve injury, stiffness, blood clots, the need for revision surgery, cardiopulmonary complications, among others, and they were willing to proceed.  OPERATIVE DESCRIPTION:  The patient was brought to the operative room and placed in a supine position.  Spinal anesthesia was administered.  IV antibiotics were given.  The lower extremity was prepped and draped in the usual sterile fashion.  Time out was performed.  The leg was elevated and exsanguinated and the tourniquet was inflated.  Anterior quadriceps tendon splitting approach was performed.  The patella was retracted and osteophytes were removed.  The anterior horn of the medial and lateral meniscus was removed and cruciate ligaments resected.   The distal femur was opened with the drill and the intramedullary distal femoral cutting jig was utilized, set at 5 degrees resecting 10 mm off the distal femur.  Care was taken to protect the collateral ligaments.  The distal femoral sizing jig was applied, taking care to  avoid notching.  Then the 4-in-1 cutting jig was applied and the anterior and posterior femur was cut, along with the chamfer cuts.    Then the extramedullary tibial cutting jig was utilized making the appropriate cut using the anterior tibial crest as a reference building in appropriate posterior slope.  Care was taken during the cut to protect the medial and collateral ligaments.  The proximal tibia was removed along with the posterior horns of the menisci.   The posterior medial femoral osteophytes and posterior lateral femoral osteophytes were removed.    The flexion gap was then measured and was symmetric with the extension gap, measured at 6.  I completed the distal femoral preparation using the appropriate jig to prepare the box.  The patella was then measured, and cut with the saw.    The proximal tibia sized and prepared accordingly with the reamer and the punch, and then all components were trialed with the trial insert.  The knee was found to have excellent balance and full motion.    The above named components were then cemented into place and all excess cement was removed.  The trial polyethylene component was in place during cementation, and then was exchanged for the real polyethylene component.    The knee was easily taken through a range of motion and the patella tracked well and the knee irrigated copiously and the parapatellar and subcutaneous tissue closed with vicryl, and monocryl with steri strips for the skin.  The arthrotomy was closed at 90 of flexion. The wounds were dressed with sterile gauze and the tourniquet released and the patient was awakened and returned to the PACU in  stable and satisfactory condition.  There were no complications.  Total tourniquet time was 90 minutes.

## 2019-08-20 NOTE — Anesthesia Procedure Notes (Signed)
Anesthesia Regional Block: Adductor canal block   Pre-Anesthetic Checklist: ,, timeout performed, Correct Patient, Correct Site, Correct Laterality, Correct Procedure, Correct Position, site marked, Risks and benefits discussed,  Surgical consent,  Pre-op evaluation,  At surgeon's request and post-op pain management  Laterality: Left  Prep: chloraprep       Needles:  Injection technique: Single-shot  Needle Type: Stimiplex     Needle Length: 9cm  Needle Gauge: 21     Additional Needles:   Procedures:,,,, ultrasound used (permanent image in chart),,,,  Narrative:  Start time: 08/20/2019 6:51 AM End time: 08/20/2019 6:56 AM Injection made incrementally with aspirations every 5 mL.  Performed by: Personally  Anesthesiologist: Lynda Rainwater, MD

## 2019-08-20 NOTE — Anesthesia Preprocedure Evaluation (Signed)
Anesthesia Evaluation  Patient identified by MRN, date of birth, ID band Patient awake    Reviewed: Allergy & Precautions, NPO status , Patient's Chart, lab work & pertinent test results  Airway Mallampati: II  TM Distance: >3 FB Neck ROM: Full    Dental no notable dental hx. (+) Teeth Intact   Pulmonary sleep apnea ,    Pulmonary exam normal breath sounds clear to auscultation       Cardiovascular Exercise Tolerance: Good hypertension, Pt. on medications Normal cardiovascular exam Rhythm:Regular Rate:Normal  03/30/19 EkG SR w 1 deg Av block R 70   Neuro/Psych Depression    GI/Hepatic   Endo/Other  Hypothyroidism   Renal/GU K+ 4.1     Musculoskeletal  (+) Arthritis ,   Abdominal (+) + obese,   Peds  Hematology Hgb 14.9 Plt 287   Anesthesia Other Findings   Reproductive/Obstetrics                             Anesthesia Physical  Anesthesia Plan  ASA: II  Anesthesia Plan: Regional and Spinal   Post-op Pain Management:  Regional for Post-op pain   Induction:   PONV Risk Score and Plan: 2 and Ondansetron, Midazolam and Treatment may vary due to age or medical condition  Airway Management Planned: Simple Face Mask and Natural Airway  Additional Equipment:   Intra-op Plan:   Post-operative Plan:   Informed Consent: I have reviewed the patients History and Physical, chart, labs and discussed the procedure including the risks, benefits and alternatives for the proposed anesthesia with the patient or authorized representative who has indicated his/her understanding and acceptance.     Dental advisory given  Plan Discussed with:   Anesthesia Plan Comments: (L TKA under spinal w R Adductor canal block)        Anesthesia Quick Evaluation

## 2019-08-20 NOTE — Anesthesia Procedure Notes (Signed)
Date/Time: 08/20/2019 6:48 AM Performed by: Sharlette Dense, CRNA Oxygen Delivery Method: Nasal cannula

## 2019-08-20 NOTE — Transfer of Care (Signed)
Immediate Anesthesia Transfer of Care Note  Patient: Veronica Finley  Procedure(s) Performed: TOTAL KNEE ARTHROPLASTY (Left Knee)  Patient Location: PACU  Anesthesia Type:Spinal  Level of Consciousness: awake, alert  and oriented  Airway & Oxygen Therapy: Patient Spontanous Breathing and Patient connected to face mask oxygen  Post-op Assessment: Report given to RN and Post -op Vital signs reviewed and stable  Post vital signs: Reviewed and stable  Last Vitals:  Vitals Value Taken Time  BP    Temp    Pulse    Resp 17 08/20/19 1027  SpO2    Vitals shown include unvalidated device data.  Last Pain:  Vitals:   08/20/19 0614  TempSrc: Oral         Complications: No apparent anesthesia complications

## 2019-08-20 NOTE — Care Management Obs Status (Signed)
[facility] NOTIFICATION   Patient Details  Name: Veronica Finley MRN: [id number] Date of Birth: [birth year]   Medicare Observation Status Notification Given:  Yes    Lia Hopping, [place] 08/20/2019, 3:41 PM

## 2019-08-20 NOTE — Anesthesia Procedure Notes (Signed)
Spinal  Patient location during procedure: OR Start time: 08/20/2019 7:45 AM End time: 08/20/2019 7:50 AM Staffing Resident/CRNA: Sharlette Dense, CRNA Performed: resident/CRNA  Preanesthetic Checklist Completed: patient identified, site marked, surgical consent, pre-op evaluation, timeout performed, IV checked, risks and benefits discussed and monitors and equipment checked Spinal Block Patient position: sitting Prep: site prepped and draped and DuraPrep Patient monitoring: heart rate, continuous pulse ox and blood pressure Approach: midline Location: L3-4 Injection technique: single-shot Needle Needle type: Pencan  Needle gauge: 24 G Needle length: 9 cm Additional Notes Kit expiration date 09/17/2020 and lot # [id number] Clear CSF, negative heme, negative paresthesia Tolerated well and returned to supine position

## 2019-08-20 NOTE — Anesthesia Postprocedure Evaluation (Signed)
Anesthesia Post Note  Patient: Veronica Finley  Procedure(s) Performed: TOTAL KNEE ARTHROPLASTY (Left Knee)     Patient location during evaluation: PACU Anesthesia Type: Regional and Spinal Level of consciousness: oriented and awake and alert Pain management: pain level controlled Vital Signs Assessment: post-procedure vital signs reviewed and stable Respiratory status: spontaneous breathing and respiratory function stable Cardiovascular status: blood pressure returned to baseline and stable Postop Assessment: no headache, no backache and no apparent nausea or vomiting Anesthetic complications: no    Last Vitals:  Vitals:   08/20/19 1100 08/20/19 1115  BP: (!) 113/59 (!) 104/58  Pulse: 67 65  Resp: 16 14  Temp:  36.5 C  SpO2: 98% 97%    Last Pain:  Vitals:   08/20/19 1113  TempSrc:   PainSc: 2                  Lynda Rainwater

## 2019-08-21 DIAGNOSIS — M1712 Unilateral primary osteoarthritis, left knee: Secondary | ICD-10-CM | POA: Diagnosis not present

## 2019-08-21 MED ORDER — ROPINIROLE HCL ER 4 MG PO TB24
4.0000 mg | ORAL_TABLET | Freq: Every day | ORAL | Status: DC
  Administered 2019-08-21: 4 mg via ORAL
  Filled 2019-08-21: qty 1

## 2019-08-21 NOTE — Progress Notes (Signed)
Physical Therapy Treatment Patient Details Name: Veronica Finley MRN: [id number] DOB: Mar 23, 1951 Today's Date: 08/21/2019    History of Present Illness Pt s/p R TKR and with hx of L TKR 5/20    PT Comments    Pt motivated and progressing with mobility but with partial L foot drop noted.  Dr. Lyla Glassing in and suggests allowing pillow under L knee to allow flexion in resting to decrease pressure on Peroneal Nerve.   Follow Up Recommendations  Home health PT     Equipment Recommendations  None recommended by PT    Recommendations for Other Services       Precautions / Restrictions Precautions Precautions: Knee;Fall Restrictions Weight Bearing Restrictions: No Other Position/Activity Restrictions: WBAT    Mobility  Bed Mobility Overal bed mobility: Needs Assistance Bed Mobility: Supine to Sit     Supine to sit: Min guard     General bed mobility comments: cues for sequence and use of L LE to self assist  Transfers Overall transfer level: Needs assistance Equipment used: Rolling walker (2 wheeled) Transfers: Sit to/from Stand Sit to Stand: Min guard         General transfer comment: Cues for LE management and use of UEs to self assist  Ambulation/Gait Ambulation/Gait assistance: Min assist;Min guard Gait Distance (Feet): 150 Feet Assistive device: Rolling walker (2 wheeled) Gait Pattern/deviations: Step-to pattern;Decreased step length - right;Decreased step length - left;Shuffle;Trunk flexed Gait velocity: decr   General Gait Details: min cues for sequence, posture and position from [employer]             Wheelchair Mobility    Modified Rankin (Stroke Patients Only)       Balance Overall balance assessment: Mild deficits observed, not formally tested                                          Cognition Arousal/Alertness: Awake/alert Behavior During Therapy: WFL for tasks assessed/performed Overall Cognitive Status:  Within Functional Limits for tasks assessed                                        Exercises Total Joint Exercises Ankle Circles/Pumps: AROM;Both;15 reps;Supine Quad Sets: AROM;Both;10 reps;Supine Heel Slides: AAROM;Left;15 reps;Supine Straight Leg Raises: AAROM;AROM;Left;10 reps;Supine Goniometric ROM: AAROM L knee -5 - 100    General Comments        Pertinent Vitals/Pain Pain Assessment: 0-10 Pain Score: 3  Pain Location: L knee Pain Descriptors / Indicators: Aching;Sore Pain Intervention(s): Limited activity within patient's tolerance;Monitored during session;Premedicated before session;Ice applied    Home Living                      Prior Function            PT Goals (current goals can now be found in the care plan section) Acute Rehab PT Goals Patient Stated Goal: Regain IND PT Goal Formulation: With patient Time For Goal Achievement: 09/03/19 Potential to Achieve Goals: Good Progress towards PT goals: Progressing toward goals    Frequency    7X/week      PT Plan Current plan remains appropriate    Co-evaluation              AM-PAC PT "6 Clicks" Mobility  Outcome Measure  Help needed turning from your back to your side while in a flat bed without using bedrails?: A Little Help needed moving from lying on your back to sitting on the side of a flat bed without using bedrails?: A Little Help needed moving to and from a bed to a chair (including a wheelchair)?: A Little Help needed standing up from a chair using your arms (e.g., wheelchair or bedside chair)?: A Little Help needed to walk in hospital room?: A Little Help needed climbing 3-5 steps with a railing? : A Lot 6 Click Score: 17    End of Session Equipment Utilized During Treatment: Gait belt Activity Tolerance: Patient tolerated treatment well Patient left: in chair;with call bell/phone within reach;with chair alarm set;with nursing/sitter in room Nurse  Communication: Mobility status PT Visit Diagnosis: Difficulty in walking, not elsewhere classified (R26.2)     Time: [id number] PT Time Calculation (min) (ACUTE ONLY): 35 min  Charges:  $Gait Training: 8-22 mins $Therapeutic Exercise: 8-22 mins                     [place] Pager [phone] Office [phone]    Mykell Genao 08/21/2019, 1:12 PM

## 2019-08-21 NOTE — Progress Notes (Signed)
  Home health agencies that serve [phone].        Home Health Agencies Search Results  Results List Table  Home Health Agency Information Quality of Patient Care Rating Patient Survey Summary Rating  [facility] [phone] 3 out of 5 stars 5 out of 5 stars  [facility] [phone] 3 out of 5 stars 4 out of 5 stars  [facility] [phone] 4 out of 5 stars 4 out of 5 stars  [facility] [phone] 4  out of 5 stars 3 out of 5 stars  [facility] [phone] 4 out of 5 stars 4 out of 5 stars  [facility] [phone] 4 out of 5 stars 4 out of 5 stars  [facility] [phone] 4  out of 5 stars 4 out of 5 stars  [facility] [phone] 4 out of 5 stars 4 out of 5 stars  [facility] [phone] 3  out of 5 stars 4 out of 5 stars  [facility] [phone] 3 out of 5 stars 4 out of 5 stars  [facility] [phone] 4 out of 5 stars Not Available12  [facility] [phone] 3  out of 5 stars 3 out of 5 stars  [facility] [phone] 3  out of 5 stars 4 out of 5 stars  [facility] [phone] 3  out of 5 stars 4 out of 5 stars  [facility] [phone] 3  out of 5 stars 3 out of 5 stars  [facility] [phone] 3  out of 5 stars Not Available11  [facility] [phone] 3  out of 5 stars 4 out of 5 stars  [facility] [phone] 4  out of 5 stars 3 out of 5 stars  [facility] [phone] 4  out of 5 stars 2 out of 5 stars   Home Health Footnotes  Footnote number Footnote as displayed on Home Health Compare  1 This agency provides services under a federal waiver program to non-traditional, chronic long term population.  2 This agency provides services to a  special needs population.  3 Not Available.  4 The number of patient episodes for this measure is too small to report.  5 This measure currently does not have data or provider has been certified/recertified for less than 6 months.  6 The national average for this measure is not provided because of state-to-state differences in data collection.  7 Medicare is not displaying rates for this measure for any home health agency, because of an issue with the data.  8 There were problems with the data and they are being corrected.  9 Zero, or very few, patients met the survey's rules for inclusion. The scores shown, if any, reflect a very small number of surveys and may not accurately tell how an agency is doing.  10 Survey results are based on less than 12 months of data.  11 Fewer than 70 patients completed the survey. Use the scores shown, if any, with caution as the number of surveys may be too low to accurately tell how an agency is doing.  12 No survey results are available for this period.  13 Data suppressed by CMS   for one or more quarters.

## 2019-08-21 NOTE — Progress Notes (Signed)
    Subjective:  Patient reports pain as mild to moderate.  Denies N/V/CP/SOB.   Objective:   VITALS:   Vitals:   08/20/19 2101 08/21/19 0106 08/21/19 0449 08/21/19 0958  BP: (!) 102/59 107/67 105/60 125/83  Pulse: (!) 58 61 64 77  Resp: 16 18 16 16   Temp: 97.8 F (36.6 C) 97.7 F (36.5 C) 97.6 F (36.4 C) 97.9 F (36.6 C)  TempSrc: Oral Oral Oral Oral  SpO2: 97% 97% 98% 97%  Weight:      Height:        NAD ABD soft Intact pulses distally Incision: dressing C/D/I Compartment soft HV ss No hematoma Minimal swelling Able to SLR, excellent quad strength 5/5 GS, trace EHL / TA  Lab Results  Component Value Date   WBC 9.1 08/20/2019   HGB 13.6 08/20/2019   HCT 42.4 08/20/2019   MCV 91.6 08/20/2019   PLT 258 08/20/2019   BMET    Component Value Date/Time   NA 139 08/12/2019 1100   K 4.2 08/12/2019 1100   CL 105 08/12/2019 1100   CO2 25 08/12/2019 1100   GLUCOSE 111 (H) 08/12/2019 1100   BUN 13 08/12/2019 1100   CREATININE 0.99 08/20/2019 1212   CALCIUM 9.6 08/12/2019 1100   GFRNONAA 59 (L) 08/20/2019 1212   GFRAA >60 08/20/2019 1212     Assessment/Plan: 1 Day Post-Op   Active Problems:   Osteoarthritis of left knee   WBAT with walker DVT ppx: Lovenox, SCDs, TEDS PO pain control PT/OT Peroneal neurapraxia: observation, flex knee over pillow to relieve stress on peroneal nerve Dispo: D/C HV drain, likely d/c home tomorrow   Veronica Finley 08/21/2019, 10:08 AM   Rod Can, MD Cell: [phone] [place] is now [facility]  [place] [address], [place], [place] [address] Phone: [phone] [email or web address] Facebook  [employer]

## 2019-08-21 NOTE — Progress Notes (Signed)
Physical Therapy Treatment Patient Details Name: Veronica Finley MRN: [id number] DOB: [birth year] Today's Date: 08/21/2019    History of Present Illness Pt s/p R TKR and with hx of L TKR 5/20    PT Comments    Pt continues to progress well with mobility and hopeful for dc home tomorrow.  Follow Up Recommendations  Home health PT     Equipment Recommendations  None recommended by PT    Recommendations for Other Services       Precautions / Restrictions Precautions Precautions: Knee;Fall Restrictions Weight Bearing Restrictions: No Other Position/Activity Restrictions: WBAT    Mobility  Bed Mobility Overal bed mobility: Needs Assistance Bed Mobility: Sit to Supine     Supine to sit: Min guard Sit to supine: Min guard   General bed mobility comments: cues for sequence and use of L LE to self assist  Transfers Overall transfer level: Needs assistance Equipment used: Rolling walker (2 wheeled) Transfers: Sit to/from Stand Sit to Stand: Min guard;Supervision         General transfer comment: Cues for LE management and use of UEs to self assist  Ambulation/Gait Ambulation/Gait assistance: Min guard;Supervision Gait Distance (Feet): 240 Feet Assistive device: Rolling walker (2 wheeled) Gait Pattern/deviations: Decreased step length - right;Decreased step length - left;Shuffle;Trunk flexed;Step-to pattern;Step-through pattern Gait velocity: decr   General Gait Details: min cues for sequence, posture and position from [employer] Mobility    Modified Rankin (Stroke Patients Only)       Balance Overall balance assessment: Mild deficits observed, not formally tested                                          Cognition Arousal/Alertness: Awake/alert Behavior During Therapy: WFL for tasks assessed/performed Overall Cognitive Status: Within Functional Limits for tasks assessed                                         Exercises Total Joint Exercises Ankle Circles/Pumps: AROM;Both;15 reps;Supine Quad Sets: AROM;Both;10 reps;Supine Heel Slides: AAROM;Left;15 reps;Supine Straight Leg Raises: AAROM;AROM;Left;10 reps;Supine Goniometric ROM: AAROM L knee -5 - 100    General Comments        Pertinent Vitals/Pain Pain Assessment: 0-10 Pain Score: 3  Pain Location: L knee Pain Descriptors / Indicators: Aching;Sore Pain Intervention(s): Limited activity within patient's tolerance;Monitored during session;Premedicated before session;Ice applied    Home Living                      Prior Function            PT Goals (current goals can now be found in the care plan section) Acute Rehab PT Goals Patient Stated Goal: Regain IND PT Goal Formulation: With patient Time For Goal Achievement: 09/03/19 Potential to Achieve Goals: Good Progress towards PT goals: Progressing toward goals    Frequency    7X/week      PT Plan Current plan remains appropriate    Co-evaluation              AM-PAC PT "6 Clicks" Mobility   Outcome Measure  Help needed turning from your back to your side while in a flat bed without using bedrails?:  A Little Help needed moving from lying on your back to sitting on the side of a flat bed without using bedrails?: A Little Help needed moving to and from a bed to a chair (including a wheelchair)?: A Little Help needed standing up from a chair using your arms (e.g., wheelchair or bedside chair)?: A Little Help needed to walk in hospital room?: A Little Help needed climbing 3-5 steps with a railing? : A Lot 6 Click Score: 17    End of Session Equipment Utilized During Treatment: Gait belt Activity Tolerance: Patient tolerated treatment well Patient left: with call bell/phone within reach;in bed;with bed alarm set Nurse Communication: Mobility status PT Visit Diagnosis: Difficulty in walking, not elsewhere classified (R26.2)      Time: [id number] PT Time Calculation (min) (ACUTE ONLY): 24 min  Charges:  $Gait Training: 8-22 mins $Therapeutic Exercise: 8-22 mins                     [place] Pager [phone] Office [phone]    Veronica Finley 08/21/2019, 4:57 PM

## 2019-08-21 NOTE — TOC Initial Note (Signed)
Transition of Care [facility]) - Initial/Assessment Note    Patient Details  Name: Veronica Finley MRN: [id number] Date of Birth: 1951-03-02  Transition of Care (TOC) CM/SW Contact:    Joaquin Courts, RN Phone Number: 08/21/2019, 12:47 PM  Clinical Narrative:    CM spoke with patient at bedside. Patient set up with [facility] for [place]. Reports has rolling walker and 3-in-1 at home.                Expected Discharge Plan: [place] Barriers to Discharge: Continued Medical Work up   Patient Goals and CMS Choice Patient states their goals for this hospitalization and ongoing recovery are:: to go home with therapy CMS Medicare.gov Compare Post Acute Care list provided to:: Patient Choice offered to / list presented to : Patient  Expected Discharge Plan and Services Expected Discharge Plan: [place]   Discharge Planning Services: CM Consult Post Acute Care Choice: [place] [employer] arrangements for the past 2 months: Single Family Home                 DME Arranged: N/A DME Agency: NA       HH Arranged: PT HH Agency: [facility] [employer] Date [place]: 08/21/19 Time [place]: 80 Representative spoke with at [place]: [place] Arrangements/Services Living arrangements for the past 2 months: [place] Lives with:: Self Patient language and need for interpreter reviewed:: Yes Do you feel safe going back to the place where you live?: Yes      Need for Family Participation in Patient Care: Yes (Comment) Care giver support system in place?: Yes (comment)   Criminal Activity/Legal Involvement Pertinent to Current Situation/Hospitalization: No - Comment as needed  Activities of Daily Living Home Assistive Devices/Equipment: Eyeglasses ADL Screening (condition at time of admission) Patient's cognitive ability adequate to safely complete daily activities?: Yes Is the  patient deaf or have difficulty hearing?: No Does the patient have difficulty seeing, even when wearing glasses/contacts?: No Does the patient have difficulty concentrating, remembering, or making decisions?: No Patient able to express need for assistance with ADLs?: Yes Does the patient have difficulty dressing or bathing?: No Independently performs ADLs?: Yes (appropriate for developmental age) Does the patient have difficulty walking or climbing stairs?: Yes Weakness of Legs: None Weakness of Arms/Hands: None  Permission Sought/Granted   Permission granted to share information with : Yes, Verbal Permission Granted     Permission granted to share info w AGENCY: [facility]        Emotional Assessment Appearance:: Appears stated age Attitude/Demeanor/Rapport: Engaged Affect (typically observed): Accepting Orientation: : Oriented to Place, Oriented to  Time, Oriented to Situation, Oriented to Self   Psych Involvement: No (comment)  Admission diagnosis:  Left knee osteoarthritis Patient Active Problem List   Diagnosis Date Noted  . Osteoarthritis of left knee 08/20/2019  . Osteoarthritis of right knee 04/02/2019  . S/P knee replacement 04/02/2019  . Arthritis 01/25/2019  . Contact dermatitis due to chemicals 01/25/2019  . OSA on CPAP 10/26/2018  . Primary osteoarthritis of knees, bilateral 07/28/2018  . Generalized hyperhidrosis 07/15/2016  . Hx of laparoscopic gastric banding+ truncal vagotomy 03/24/2006 01/10/2014  . Lumbar spinal stenosis 07/08/2013  . Major depression, chronic 07/08/2013  . Essential hypertension 04/06/2012  . Acquired hypothyroidism 10/08/2011  . Mixed hyperlipidemia 10/08/2011  . AR (allergic rhinitis) 12/20/2010  . Restless legs syndrome (RLS) 12/20/2010  .  Obesity (BMI 30-39.9) 12/17/2010  . Osteoarthritis, multiple sites 12/17/2010   PCP:  Leamon Arnt, MD Pharmacy:   [facility] [address], [place] - [address] [place] [address] Phone: [phone] Fax: [phone]  [place], [place] - [address] AT [facility] [address] [place] [address] Phone: [phone] Fax: [phone]     Social Determinants of Health (SDOH) Interventions    Readmission Risk Interventions No flowsheet data found.

## 2019-08-22 DIAGNOSIS — M1712 Unilateral primary osteoarthritis, left knee: Secondary | ICD-10-CM | POA: Diagnosis not present

## 2019-08-22 NOTE — Progress Notes (Signed)
Physical Therapy Treatment Patient Details Name: Veronica Finley MRN: [id number] DOB: 1951/07/15 Today's Date: 08/22/2019    History of Present Illness Pt s/p R TKR and with hx of L TKR 5/20    PT Comments    Pt mobilizing well in halls and negotiated stairs with min difficulty.  Pt eager for dc home.   Follow Up Recommendations  Home health PT     Equipment Recommendations  None recommended by PT    Recommendations for Other Services       Precautions / Restrictions Precautions Precautions: Knee;Fall Restrictions Weight Bearing Restrictions: No Other Position/Activity Restrictions: WBAT    Mobility  Bed Mobility Overal bed mobility: Needs Assistance Bed Mobility: Supine to Sit     Supine to sit: Modified independent (Device/Increase time)     General bed mobility comments: No assist needed  Transfers Overall transfer level: Needs assistance Equipment used: Rolling walker (2 wheeled) Transfers: Sit to/from Stand Sit to Stand: Supervision         General transfer comment: Cues for LE management and use of UEs to self assist; no physical assist needed  Ambulation/Gait Ambulation/Gait assistance: Supervision Gait Distance (Feet): 200 Feet Assistive device: Rolling walker (2 wheeled) Gait Pattern/deviations: Decreased step length - right;Decreased step length - left;Shuffle;Trunk flexed;Step-to pattern;Step-through pattern Gait velocity: decr   General Gait Details: min cues for sequence, posture and position from RW   Stairs Stairs: Yes Stairs assistance: Min assist Stair Management: One rail Right;Step to pattern;Forwards;With crutches Number of Stairs: 5 General stair comments: cues for sequence and foot/crutch placement   Wheelchair Mobility    Modified Rankin (Stroke Patients Only)       Balance Overall balance assessment: Mild deficits observed, not formally tested                                          Cognition  Arousal/Alertness: Awake/alert Behavior During Therapy: WFL for tasks assessed/performed Overall Cognitive Status: Within Functional Limits for tasks assessed                                        Exercises Total Joint Exercises Ankle Circles/Pumps: AROM;Both;15 reps;Supine Quad Sets: AROM;Both;10 reps;Supine Heel Slides: AAROM;Left;Supine;20 reps Straight Leg Raises: AAROM;AROM;Left;Supine;20 reps Long Arc Quad: AAROM;AROM;Left Knee Flexion: AROM;AAROM;Left;10 reps;Seated Goniometric ROM: AAROM at L knee -5 - 100    General Comments        Pertinent Vitals/Pain Pain Assessment: 0-10 Pain Score: 4  Pain Location: L knee Pain Descriptors / Indicators: Aching;Sore Pain Intervention(s): Premedicated before session;Monitored during session;Limited activity within patient's tolerance;Ice applied    Home Living                      Prior Function            PT Goals (current goals can now be found in the care plan section) Acute Rehab PT Goals Patient Stated Goal: Regain IND PT Goal Formulation: With patient Time For Goal Achievement: 09/03/19 Potential to Achieve Goals: Good Progress towards PT goals: Progressing toward goals    Frequency    7X/week      PT Plan Current plan remains appropriate    Co-evaluation              AM-PAC PT "6  Clicks" Mobility   Outcome Measure  Help needed turning from your back to your side while in a flat bed without using bedrails?: None Help needed moving from lying on your back to sitting on the side of a flat bed without using bedrails?: None Help needed moving to and from a bed to a chair (including a wheelchair)?: A Little Help needed standing up from a chair using your arms (e.g., wheelchair or bedside chair)?: A Little Help needed to walk in hospital room?: A Little Help needed climbing 3-5 steps with a railing? : A Little 6 Click Score: 20    End of Session Equipment Utilized During  Treatment: Gait belt Activity Tolerance: Patient tolerated treatment well Patient left: with call bell/phone within reach;in chair;with chair alarm set Nurse Communication: Mobility status PT Visit Diagnosis: Difficulty in walking, not elsewhere classified (R26.2)     Time: [id number] PT Time Calculation (min) (ACUTE ONLY): 22 min  Charges:  $Gait Training: 8-22 mins $Therapeutic Exercise: 23-37 mins                     [place] Pager [phone] Office [phone]    Zalma Channing 08/22/2019, 1:37 PM

## 2019-08-22 NOTE — Progress Notes (Signed)
    Subjective:  Doing well.  Peroneal nerve working today.  Objective:   VITALS:   Vitals:   08/21/19 0958 08/21/19 1340 08/21/19 1937 08/22/19 0536  BP: 125/83 (!) 127/95 (!) 117/59 107/63  Pulse: 77 84 82 66  Resp: 16 16 18 16   Temp: 97.9 F (36.6 C) 98.3 F (36.8 C) 98.6 F (37 C) 98.2 F (36.8 C)  TempSrc: Oral Oral Oral Oral  SpO2: 97% 97% 96% 92%  Weight:      Height:        NAD ABD soft Intact pulses distally Incision: dressing C/D/I Compartment soft HV ss No hematoma Minimal swelling Able to SLR, excellent quad strength 5/5 GS, 5/5 EHL / TA  Lab Results  Component Value Date   WBC 9.1 08/20/2019   HGB 13.6 08/20/2019   HCT 42.4 08/20/2019   MCV 91.6 08/20/2019   PLT 258 08/20/2019   BMET    Component Value Date/Time   NA 139 08/12/2019 1100   K 4.2 08/12/2019 1100   CL 105 08/12/2019 1100   CO2 25 08/12/2019 1100   GLUCOSE 111 (H) 08/12/2019 1100   BUN 13 08/12/2019 1100   CREATININE 0.99 08/20/2019 1212   CALCIUM 9.6 08/12/2019 1100   GFRNONAA 59 (L) 08/20/2019 1212   GFRAA >60 08/20/2019 1212     Assessment/Plan: 2 Days Post-Op   Active Problems:   Osteoarthritis of left knee   WBAT with walker DVT ppx: Lovenox, SCDs, TEDS PO pain control PT/OT Dispo: D/C HV drain, likely d/c home today  Nicholes Stairs 08/22/2019, 9:05 AM   [facility] is now [employer] [address], [place], [place] [address] Phone: [phone] [email or web address] Facebook  [employer]

## 2019-08-22 NOTE — Discharge Instructions (Signed)
TOTAL KNEE REPLACEMENT POSTOPERATIVE DIRECTIONS    Knee Rehabilitation, Guidelines Following Surgery  Results after knee surgery are often greatly improved when you follow the exercise, range of motion and muscle strengthening exercises prescribed by your doctor. Safety measures are also important to protect the knee from further injury. Any time any of these exercises cause you to have increased pain or swelling in your knee joint, decrease the amount until you are comfortable again and slowly increase them. If you have problems or questions, call your caregiver or physical therapist for advice.   HOME CARE INSTRUCTIONS  Remove items at home which could result in a fall. This includes throw rugs or furniture in walking pathways.  Continue medications as instructed at time of discharge. You may have some home medications which will be placed on hold until you complete the course of blood thinner medication.  Your dressing is waterproof. You may start showering once you are discharged home, but do not submerge the dressing under water.  Walk with walker as instructed.  You may resume a sexual relationship in one month or when given the OK by your doctor.   Use walker as long as suggested by your caregivers.  Avoid periods of inactivity such as sitting longer than an hour when not asleep. This helps prevent blood clots.  You may put full weight on your legs and walk as much as is comfortable.  You may return to work once you are cleared by your doctor.  Do not drive a car for 6 weeks or until released by you surgeon.   Do not drive while taking narcotics.  Wear the elastic stockings for three weeks following surgery during the day but you may remove then at night. Make sure you keep all of your appointments after your operation with all of your doctors and caregivers. You should call the office at the above phone number and make an appointment for approximately two weeks after the date of your  surgery. Change the dressing daily and reapply a dry dressing each time. Please pick up a stool softener and laxative for home use as long as you are requiring pain medications.  ICE to the affected knee every three hours for 30 minutes at a time and then as needed for pain and swelling.  Continue to use ice on the knee for pain and swelling from surgery. You may notice swelling that will progress down to the foot and ankle.  This is normal after surgery.  Elevate the leg when you are not up walking on it.   It is important for you to complete the blood thinner medication as prescribed by your doctor.  Continue to use the breathing machine which will help keep your temperature down.  It is common for your temperature to cycle up and down following surgery, especially at night when you are not up moving around and exerting yourself.  The breathing machine keeps your lungs expanded and your temperature down.  RANGE OF MOTION AND STRENGTHENING EXERCISES  Rehabilitation of the knee is important following a knee injury or an operation. After just a few days of immobilization, the muscles of the thigh which control the knee become weakened and shrink (atrophy). Knee exercises are designed to build up the tone and strength of the thigh muscles and to improve knee motion. Often times heat used for twenty to thirty minutes before working out will loosen up your tissues and help with improving the range of motion but do not  use heat for the first two weeks following surgery. These exercises can be done on a training (exercise) mat, on the floor, on a table or on a bed. Use what ever works the best and is most comfortable for you Knee exercises include:  Leg Lifts - While your knee is still immobilized in a splint or cast, you can do straight leg raises. Lift the leg to 60 degrees, hold for 3 sec, and slowly lower the leg. Repeat 10-20 times 2-3 times daily. Perform this exercise against resistance later as your  knee gets better.  Quad and Hamstring Sets - Tighten up the muscle on the front of the thigh (Quad) and hold for 5-10 sec. Repeat this 10-20 times hourly. Hamstring sets are done by pushing the foot backward against an object and holding for 5-10 sec. Repeat as with quad sets.  A rehabilitation program following serious knee injuries can speed recovery and prevent re-injury in the future due to weakened muscles. Contact your doctor or a physical therapist for more information on knee rehabilitation.   SKILLED REHAB INSTRUCTIONS: If the patient is transferred to a skilled rehab facility following release from the hospital, a list of the current medications will be sent to the facility for the patient to continue.  When discharged from the skilled rehab facility, please have the facility set up the patient's [place] prior to being released. Also, the skilled facility will be responsible for providing the patient with their medications at time of release from the facility to include their pain medication, the muscle relaxants, and their blood thinner medication. If the patient is still at the rehab facility at time of the two week follow up appointment, the skilled rehab facility will also need to assist the patient in arranging follow up appointment in our office and any transportation needs.  MAKE SURE YOU:  Understand these instructions.  Will watch your condition.  Will get help right away if you are not doing well or get worse.    Pick up stool softner and laxative for home use following surgery while on pain medications. Your dressing is waterproof. You may take showers, but do not submerge incision under water. Do not remove your dressing. Continue to use ice for pain and swelling after surgery. Do not use any lotions or creams on the incision until instructed by your surgeon.

## 2019-08-22 NOTE — Progress Notes (Signed)
Physical Therapy Treatment Patient Details Name: Veronica Finley MRN: [id number] DOB: [birth year] Today's Date: 08/22/2019    History of Present Illness Pt s/p R TKR and with hx of L TKR 5/20    PT Comments    Pt performed home therex program with assist - written instruction provided and reviewed.   Follow Up Recommendations  Home health PT     Equipment Recommendations  None recommended by PT    Recommendations for Other Services       Precautions / Restrictions Precautions Precautions: Knee;Fall Restrictions Weight Bearing Restrictions: No Other Position/Activity Restrictions: WBAT    Mobility  Bed Mobility Overal bed mobility: Needs Assistance Bed Mobility: Supine to Sit     Supine to sit: Modified independent (Device/Increase time)     General bed mobility comments: No assist needed  Transfers Overall transfer level: Needs assistance Equipment used: Rolling walker (2 wheeled) Transfers: Sit to/from Stand Sit to Stand: Supervision         General transfer comment: Cues for LE management and use of UEs to self assist; no physical assist needed  Ambulation/Gait Ambulation/Gait assistance: Supervision Gait Distance (Feet): 20 Feet Assistive device: Rolling walker (2 wheeled) Gait Pattern/deviations: Decreased step length - right;Decreased step length - left;Shuffle;Trunk flexed;Step-to pattern;Step-through pattern Gait velocity: decr   General Gait Details: min cues for sequence, posture and position from [employer] Mobility    Modified Rankin (Stroke Patients Only)       Balance Overall balance assessment: Mild deficits observed, not formally tested                                          Cognition Arousal/Alertness: Awake/alert Behavior During Therapy: WFL for tasks assessed/performed Overall Cognitive Status: Within Functional Limits for tasks assessed                                         Exercises Total Joint Exercises Ankle Circles/Pumps: AROM;Both;15 reps;Supine Quad Sets: AROM;Both;10 reps;Supine Heel Slides: AAROM;Left;Supine;20 reps Straight Leg Raises: AAROM;AROM;Left;Supine;20 reps Long Arc Quad: AAROM;AROM;Left Knee Flexion: AROM;AAROM;Left;10 reps;Seated Goniometric ROM: AAROM at L knee -5 - 100    General Comments        Pertinent Vitals/Pain Pain Assessment: 0-10 Pain Score: 4  Pain Location: L knee Pain Descriptors / Indicators: Aching;Sore Pain Intervention(s): Limited activity within patient's tolerance;Monitored during session;Premedicated before session;Ice applied    Home Living                      Prior Function            PT Goals (current goals can now be found in the care plan section) Acute Rehab PT Goals Patient Stated Goal: Regain IND PT Goal Formulation: With patient Time For Goal Achievement: 09/03/19 Potential to Achieve Goals: Good Progress towards PT goals: Progressing toward goals    Frequency    7X/week      PT Plan Current plan remains appropriate    Co-evaluation              AM-PAC PT "6 Clicks" Mobility   Outcome Measure  Help needed turning from your back to your side while in a flat bed  without using bedrails?: None Help needed moving from lying on your back to sitting on the side of a flat bed without using bedrails?: None Help needed moving to and from a bed to a chair (including a wheelchair)?: A Little Help needed standing up from a chair using your arms (e.g., wheelchair or bedside chair)?: A Little Help needed to walk in hospital room?: A Little Help needed climbing 3-5 steps with a railing? : A Little 6 Click Score: 20    End of Session Equipment Utilized During Treatment: Gait belt Activity Tolerance: Patient tolerated treatment well Patient left: with call bell/phone within reach;in chair;with chair alarm set Nurse Communication: Mobility status PT  Visit Diagnosis: Difficulty in walking, not elsewhere classified (R26.2)     Time: [id number] PT Time Calculation (min) (ACUTE ONLY): 24 min  Charges:  $Therapeutic Exercise: 23-37 mins                     [place] Pager [phone] Office [phone]    Trenda Corliss 08/22/2019, 1:34 PM

## 2019-08-23 ENCOUNTER — Encounter (HOSPITAL_COMMUNITY): Payer: Self-pay | Admitting: Specialist

## 2019-09-01 NOTE — Discharge Summary (Signed)
Physician Discharge Summary  Patient ID: ILICIA VALLESE MRN: [id number] DOB/AGE: 1951/04/01 68 y.o.  Admit date: 08/20/2019 Discharge date: 09/01/2019  Admission Diagnoses: left knee pain  Discharge Diagnoses:  Active Problems:   Osteoarthritis of left knee   Discharged Condition: good  Hospital Course: patient was admitted on October 2 for a total knee arthroplasty of the left knee.  Surgery went well with no complications.  She was sent  PACU in stable condition.pain was well tolerable for the patient after surgery.  Postop day 1 patient was doing well with mild to moderate pain.  She did have a little bit of the peroneal nervapraxia which started resolving as the day went on. Hemovac drain was removed postop day 1. Postop day 2 patient was doing well, peroneal neurapraxia was resolving. She was able to be discharged home this day in stable condition.  Consults: None  Significant Diagnostic Studies: None  Treatments: antibiotics: Ancef and ceftriaxone, analgesia: Oxycodone and anticoagulation: Lovanox  Discharge Exam: Blood pressure 107/63, pulse 66, temperature 98.2 F (36.8 C), temperature source Oral, resp. rate 16, height 5\' 6"  (1.676 m), weight 108.9 kg, SpO2 92 %. General appearance: alert, cooperative and appears stated age Extremities: extremities normal, atraumatic, no cyanosis or edema and Homans sign is negative, no sign of DVT, Minimal ROM of left knee. Able to dorsiflex and plantar flex left ankle with no pain Pulses: 2+ and symmetric Skin: Skin color, texture, turgor normal. No rashes or lesions Neurologic: Alert and oriented X 3, normal strength and tone. Normal symmetric reflexes. Normal coordination and gait Incision/Wound: Clean dry and intact  Disposition:   Discharge Instructions    Call MD / Call 911   Complete by: As directed    If you experience chest pain or shortness of breath, CALL 911 and be transported to the hospital emergency room.  If you  develope a fever above 101 F, pus (white drainage) or increased drainage or redness at the wound, or calf pain, call your surgeon's office.   Call MD / Call 911   Complete by: As directed    If you experience chest pain or shortness of breath, CALL 911 and be transported to the hospital emergency room.  If you develope a fever above 101 F, pus (white drainage) or increased drainage or redness at the wound, or calf pain, call your surgeon's office.   Call MD / Call 911   Complete by: As directed    If you experience chest pain or shortness of breath, CALL 911 and be transported to the hospital emergency room.  If you develope a fever above 101 F, pus (white drainage) or increased drainage or redness at the wound, or calf pain, call your surgeon's office.   Constipation Prevention   Complete by: As directed    Drink plenty of fluids.  Prune juice may be helpful.  You may use a stool softener, such as Colace (over the counter) 100 mg twice a day.  Use MiraLax (over the counter) for constipation as needed.   Constipation Prevention   Complete by: As directed    Drink plenty of fluids.  Prune juice may be helpful.  You may use a stool softener, such as Colace (over the counter) 100 mg twice a day.  Use MiraLax (over the counter) for constipation as needed.   Constipation Prevention   Complete by: As directed    Drink plenty of fluids.  Prune juice may be helpful.  You may use a stool  softener, such as Colace (over the counter) 100 mg twice a day.  Use MiraLax (over the counter) for constipation as needed.   Diet - low sodium heart healthy   Complete by: As directed    Increase activity slowly as tolerated   Complete by: As directed    Increase activity slowly as tolerated   Complete by: As directed    Increase activity slowly as tolerated   Complete by: As directed    TED hose   Complete by: As directed    Use stockings (TED hose) for 2 weeks on bilateral leg(s).  You may remove them at night  for sleeping.     Allergies as of 08/22/2019      Reactions   Codeine Other (See Comments)   "makes pass out"   Thimerosal Itching      Medication List    STOP taking these medications   meloxicam 15 MG tablet Commonly known as: MOBIC     TAKE these medications   acetaminophen 500 MG tablet Commonly known as: TYLENOL Take 500-1,000 mg by mouth every 6 (six) hours as needed (for pain.).   aspirin EC 325 MG tablet Take 1 tablet (325 mg total) by mouth 2 (two) times daily. What changed: when to take this   diltiazem 240 MG 24 hr capsule Commonly known as: Cartia XT Take 1 capsule (240 mg total) by mouth daily.   ferrous sulfate 325 (65 FE) MG tablet Take 1 tablet (325 mg total) by mouth 3 (three) times daily after meals.   FLUoxetine 20 MG capsule Commonly known as: PROZAC Take 1 capsule (20 mg total) by mouth daily.   levothyroxine 175 MCG tablet Commonly known as: SYNTHROID Take 1 tablet (175 mcg total) by mouth daily before breakfast.   losartan-hydrochlorothiazide 100-25 MG tablet Commonly known as: HYZAAR Take 1 tablet by mouth daily.   methocarbamol 500 MG tablet Commonly known as: Robaxin Take 1 tablet (500 mg total) by mouth 4 (four) times daily. What changed:   when to take this  reasons to take this   ondansetron 4 MG tablet Commonly known as: Zofran Take 1 tablet (4 mg total) by mouth daily as needed for nausea or vomiting.   rOPINIRole 4 MG 24 hr tablet Commonly known as: REQUIP XL Take 4 mg by mouth at bedtime.   rosuvastatin 10 MG tablet Commonly known as: CRESTOR TAKE ONE TABLET BY MOUTH EVERY NIGHT AT BEDTIME   Vitamin D 50 MCG (2000 UT) tablet Take 2,000 Units by mouth every evening.     ASK your doctor about these medications   oxyCODONE 5 MG immediate release tablet Commonly known as: Roxicodone Take 1 tablet (5 mg total) by mouth every 4 (four) hours as needed for up to 7 days for moderate pain. Ask about: Should I take this  medication?      Follow-up Information    [facility] Follow up.   Specialty: Home Health Services Why: agency will provide home health physical therapy. agency will call you to schedule first visit. Contact information: [address] [place] [address] [phone]        Sydnee Cabal, MD Follow up in 2 week(s).   Specialty: Orthopedic Surgery Contact information: [address] [place] [address] [id number]           Signed: Drue Novel 09/01/2019, 8:45 AM

## 2019-09-13 ENCOUNTER — Ambulatory Visit: Payer: Medicare Other

## 2019-10-09 ENCOUNTER — Other Ambulatory Visit: Payer: Self-pay | Admitting: Family Medicine

## 2019-10-17 ENCOUNTER — Other Ambulatory Visit: Payer: Self-pay | Admitting: Family Medicine

## 2019-11-01 ENCOUNTER — Encounter: Payer: Self-pay | Admitting: Family Medicine

## 2019-11-02 MED ORDER — MELOXICAM 15 MG PO TABS: 15.0000 mg | ORAL_TABLET | Freq: Every day | ORAL | 1 refills | Status: DC | PRN

## 2019-12-08 DIAGNOSIS — H25812 Combined forms of age-related cataract, left eye: Secondary | ICD-10-CM | POA: Diagnosis not present

## 2019-12-08 DIAGNOSIS — H2511 Age-related nuclear cataract, right eye: Secondary | ICD-10-CM | POA: Diagnosis not present

## 2019-12-08 DIAGNOSIS — H04123 Dry eye syndrome of bilateral lacrimal glands: Secondary | ICD-10-CM | POA: Diagnosis not present

## 2019-12-08 DIAGNOSIS — H40012 Open angle with borderline findings, low risk, left eye: Secondary | ICD-10-CM | POA: Diagnosis not present

## 2019-12-08 DIAGNOSIS — H1045 Other chronic allergic conjunctivitis: Secondary | ICD-10-CM | POA: Diagnosis not present

## 2019-12-11 ENCOUNTER — Other Ambulatory Visit: Payer: Self-pay | Admitting: Family Medicine

## 2020-01-11 ENCOUNTER — Other Ambulatory Visit: Payer: Self-pay | Admitting: Family Medicine

## 2020-01-15 ENCOUNTER — Other Ambulatory Visit: Payer: Self-pay | Admitting: Family Medicine

## 2020-01-28 ENCOUNTER — Ambulatory Visit: Payer: Medicare Other

## 2020-02-22 ENCOUNTER — Other Ambulatory Visit: Payer: Self-pay

## 2020-02-22 ENCOUNTER — Ambulatory Visit (INDEPENDENT_AMBULATORY_CARE_PROVIDER_SITE_OTHER): Payer: Medicare PPO

## 2020-02-22 DIAGNOSIS — Z0000 Encounter for general adult medical examination without abnormal findings: Secondary | ICD-10-CM

## 2020-02-22 NOTE — Patient Instructions (Signed)
Veronica Finley , Thank you for taking time to come for your Medicare Wellness Visit. I appreciate your ongoing commitment to your health goals. Please review the following plan we discussed and let me know if I can assist you in the future.   Screening recommendations/referrals: Colorectal Screening: up to date; last colonoscopy 03/04/15 Mammogram: reccommended; see handout to schedule  Bone Density: up to date; last 11/20/18  Vision and Dental Exams: Recommended annual ophthalmology exams for early detection of glaucoma and other disorders of the eye Recommended annual dental exams for proper oral hygiene  Vaccinations: Influenza vaccine: recommended each fall  Pneumococcal vaccine: Prevnar recommended  Tdap vaccine: up to date; last 07/21/18  Shingles vaccine: You may receive this vaccine at your local pharmacy. (see handout)  Covid vaccine: completed   Advanced directives: Advance directives discussed with you today. I have provided a copy for you to complete at home and have notarized. Once this is complete please bring a copy in to our office so we can scan it into your chart.  Goals: Recommend to drink at least 6-8 8oz glasses of water per day and consume a balanced diet rich in fresh fruits and vegetables.   Next appointment: Please schedule your Annual Wellness Visit with your Nurse Health Advisor in one year.  Preventive Care 12 Years and Older, Female Preventive care refers to lifestyle choices and visits with your health care provider that can promote health and wellness. What does preventive care include?  A yearly physical exam. This is also called an annual well check.  Dental exams once or twice a year.  Routine eye exams. Ask your health care provider how often you should have your eyes checked.  Personal lifestyle choices, including:  Daily care of your teeth and gums.  Regular physical activity.  Eating a healthy diet.  Avoiding tobacco and drug use.  Limiting  alcohol use.  Practicing safe sex.  Taking low-dose aspirin every day if recommended by your health care provider.  Taking vitamin and mineral supplements as recommended by your health care provider. What happens during an annual well check? The services and screenings done by your health care provider during your annual well check will depend on your age, overall health, lifestyle risk factors, and family history of disease. Counseling  Your health care provider may ask you questions about your:  Alcohol use.  Tobacco use.  Drug use.  Emotional well-being.  Home and relationship well-being.  Sexual activity.  Eating habits.  History of falls.  Memory and ability to understand (cognition).  Work and work Statistician.  Reproductive health. Screening  You may have the following tests or measurements:  Height, weight, and BMI.  Blood pressure.  Lipid and cholesterol levels. These may be checked every 5 years, or more frequently if you are over 25 years old.  Skin check.  Lung cancer screening. You may have this screening every year starting at age 48 if you have a 30-pack-year history of smoking and currently smoke or have quit within the past 15 years.  Fecal occult blood test (FOBT) of the stool. You may have this test every year starting at age 51.  Flexible sigmoidoscopy or colonoscopy. You may have a sigmoidoscopy every 5 years or a colonoscopy every 10 years starting at age 66.  Hepatitis C blood test.  Hepatitis B blood test.  Sexually transmitted disease (STD) testing.  Diabetes screening. This is done by checking your blood sugar (glucose) after you have not eaten for  a while (fasting). You may have this done every 1-3 years.  Bone density scan. This is done to screen for osteoporosis. You may have this done starting at age 74.  Mammogram. This may be done every 1-2 years. Talk to your health care provider about how often you should have regular  mammograms. Talk with your health care provider about your test results, treatment options, and if necessary, the need for more tests. Vaccines  Your health care provider may recommend certain vaccines, such as:  Influenza vaccine. This is recommended every year.  Tetanus, diphtheria, and acellular pertussis (Tdap, Td) vaccine. You may need a Td booster every 10 years.  Zoster vaccine. You may need this after age 87.  Pneumococcal 13-valent conjugate (PCV13) vaccine. One dose is recommended after age 24.  Pneumococcal polysaccharide (PPSV23) vaccine. One dose is recommended after age 100. Talk to your health care provider about which screenings and vaccines you need and how often you need them. This information is not intended to replace advice given to you by your health care provider. Make sure you discuss any questions you have with your health care provider. Document Released: 12/01/2015 Document Revised: 07/24/2016 Document Reviewed: 09/05/2015 Elsevier Interactive Patient Education  2017 [employer] Prevention in the Home Falls can cause injuries. They can happen to people of all ages. There are many things you can do to make your home safe and to help prevent falls. What can I do on the outside of my home?  Regularly fix the edges of walkways and driveways and fix any cracks.  Remove anything that might make you trip as you walk through a door, such as a raised step or threshold.  Trim any bushes or trees on the path to your home.  Use bright outdoor lighting.  Clear any walking paths of anything that might make someone trip, such as rocks or tools.  Regularly check to see if handrails are loose or broken. Make sure that both sides of any steps have handrails.  Any raised decks and porches should have guardrails on the edges.  Have any leaves, snow, or ice cleared regularly.  Use sand or salt on walking paths during winter.  Clean up any spills in your garage  right away. This includes oil or grease spills. What can I do in the bathroom?  Use night lights.  Install grab bars by the toilet and in the tub and shower. Do not use towel bars as grab bars.  Use non-skid mats or decals in the tub or shower.  If you need to sit down in the shower, use a plastic, non-slip stool.  Keep the floor dry. Clean up any water that spills on the floor as soon as it happens.  Remove soap buildup in the tub or shower regularly.  Attach bath mats securely with double-sided non-slip rug tape.  Do not have throw rugs and other things on the floor that can make you trip. What can I do in the bedroom?  Use night lights.  Make sure that you have a light by your bed that is easy to reach.  Do not use any sheets or blankets that are too big for your bed. They should not hang down onto the floor.  Have a firm chair that has side arms. You can use this for support while you get dressed.  Do not have throw rugs and other things on the floor that can make you trip. What can I do in  the kitchen?  Clean up any spills right away.  Avoid walking on wet floors.  Keep items that you use a lot in easy-to-reach places.  If you need to reach something above you, use a strong step stool that has a grab bar.  Keep electrical cords out of the way.  Do not use floor polish or wax that makes floors slippery. If you must use wax, use non-skid floor wax.  Do not have throw rugs and other things on the floor that can make you trip. What can I do with my stairs?  Do not leave any items on the stairs.  Make sure that there are handrails on both sides of the stairs and use them. Fix handrails that are broken or loose. Make sure that handrails are as long as the stairways.  Check any carpeting to make sure that it is firmly attached to the stairs. Fix any carpet that is loose or worn.  Avoid having throw rugs at the top or bottom of the stairs. If you do have throw rugs,  attach them to the floor with carpet tape.  Make sure that you have a light switch at the top of the stairs and the bottom of the stairs. If you do not have them, ask someone to add them for you. What else can I do to help prevent falls?  Wear shoes that:  Do not have high heels.  Have rubber bottoms.  Are comfortable and fit you well.  Are closed at the toe. Do not wear sandals.  If you use a stepladder:  Make sure that it is fully opened. Do not climb a closed stepladder.  Make sure that both sides of the stepladder are locked into place.  Ask someone to hold it for you, if possible.  Clearly mark and make sure that you can see:  Any grab bars or handrails.  First and last steps.  Where the edge of each step is.  Use tools that help you move around (mobility aids) if they are needed. These include:  Canes.  Walkers.  Scooters.  Crutches.  Turn on the lights when you go into a dark area. Replace any light bulbs as soon as they burn out.  Set up your furniture so you have a clear path. Avoid moving your furniture around.  If any of your floors are uneven, fix them.  If there are any pets around you, be aware of where they are.  Review your medicines with your doctor. Some medicines can make you feel dizzy. This can increase your chance of falling. Ask your doctor what other things that you can do to help prevent falls. This information is not intended to replace advice given to you by your health care provider. Make sure you discuss any questions you have with your health care provider. Document Released: 08/31/2009 Document Revised: 04/11/2016 Document Reviewed: 12/09/2014 Elsevier Interactive Patient Education  2017 [employer].

## 2020-02-22 NOTE — Progress Notes (Signed)
This visit is being conducted via phone call due to the COVID-19 pandemic. This patient has given me verbal consent via phone to conduct this visit, patient states they are participating from their home address. Some vital signs may be absent or patient reported.   Patient identification: identified by name, DOB, and current address.  Location provider: [facility], Office Persons participating in the virtual visit: Denman George LPN, patient, and Dr. Billey Chang    Subjective:   Veronica Finley is a 69 y.o. female who presents for an Initial Medicare Annual Wellness Visit.  Review of Systems     Cardiac Risk Factors include: advanced age (>34men, >54 women);hypertension;dyslipidemia    Objective:    There were no vitals filed for this visit. There is no height or weight on file to calculate BMI.  Advanced Directives 02/22/2020 08/20/2019 08/20/2019 08/12/2019 04/02/2019 04/02/2019 03/30/2019  Does Patient Have a Medical Advance Directive? No No No No No No No  Would patient like information on creating a medical advance directive? Yes (MAU/Ambulatory/Procedural Areas - Information given) No - Patient declined No - Patient declined No - Patient declined No - Patient declined No - Patient declined Yes (MAU/Ambulatory/Procedural Areas - Information given)    Current Medications (verified) Outpatient Encounter Medications as of 02/22/2020  Medication Sig  . acetaminophen (TYLENOL) 500 MG tablet Take 500-1,000 mg by mouth every 6 (six) hours as needed (for pain.).  Marland Kitchen CARTIA XT 240 MG 24 hr capsule TAKE ONE CAPSULE BY MOUTH DAILY  . Cholecalciferol (VITAMIN D) 2000 units tablet Take 2,000 Units by mouth every evening.   . ferrous sulfate 325 (65 FE) MG tablet Take 1 tablet (325 mg total) by mouth 3 (three) times daily after meals.  Marland Kitchen FLUoxetine (PROZAC) 20 MG capsule TAKE ONE CAPSULE BY MOUTH DAILY  . levothyroxine (SYNTHROID) 175 MCG tablet TAKE ONE TABLET BY MOUTH DAILY BEFORE BREAKFAST    . losartan-hydrochlorothiazide (HYZAAR) 100-25 MG tablet TAKE ONE TABLET BY MOUTH DAILY  . meloxicam (MOBIC) 15 MG tablet Take 1 tablet (15 mg total) by mouth daily as needed for pain.  Marland Kitchen rOPINIRole (REQUIP XL) 4 MG 24 hr tablet TAKE ONE TABLET BY MOUTH EVERY NIGHT AT BEDTIME  . rosuvastatin (CRESTOR) 10 MG tablet Take 1 tablet (10 mg total) by mouth at bedtime.  . methocarbamol (ROBAXIN) 500 MG tablet Take 1 tablet (500 mg total) by mouth 4 (four) times daily. (Patient not taking: Reported on 02/22/2020)  . ondansetron (ZOFRAN) 4 MG tablet Take 1 tablet (4 mg total) by mouth daily as needed for nausea or vomiting. (Patient not taking: Reported on 02/22/2020)   No facility-administered encounter medications on file as of 02/22/2020.    Allergies (verified) Codeine and Thimerosal   History: Past Medical History:  Diagnosis Date  . Arthritis   . Depression   . Facial twitching   . Hyperlipidemia   . Hypertension   . Hypothyroidism   . Lumbar spinal stenosis 07/08/2013  . Murmur, cardiac    per cardiologist she saw before having lap band surgery that she had a mild leaky valve and a mild murmur   . OSA on CPAP 10/26/2018   Past Surgical History:  Procedure Laterality Date  . GASTRIC BYPASS    . KNEE ARTHROSCOPY    . SPINE SURGERY    . TONSILLECTOMY    . TOTAL KNEE ARTHROPLASTY Right 04/02/2019   Procedure: TOTAL KNEE ARTHROPLASTY;  Surgeon: Sydnee Cabal, MD;  Location: [facility];  Service:  Orthopedics;  Laterality: Right;  spinal plus adductor canal  . TOTAL KNEE ARTHROPLASTY Left 08/20/2019   Procedure: TOTAL KNEE ARTHROPLASTY;  Surgeon: Sydnee Cabal, MD;  Location: [facility];  Service: Orthopedics;  Laterality: Left;  with adductor canal  . TUBAL LIGATION    . UVULECTOMY     Family History  Problem Relation Age of Onset  . Arthritis Mother   . Atrial fibrillation Mother   . Hyperlipidemia Mother   . Hypertension Mother   . Leukemia Father   . Heart disease Father   . Arthritis  Sister   . Arthritis Brother   . Diabetes Brother   . Hyperlipidemia Brother   . Allergic rhinitis Neg Hx   . Angioedema Neg Hx   . Asthma Neg Hx   . Eczema Neg Hx   . Immunodeficiency Neg Hx   . Urticaria Neg Hx    Social History   Socioeconomic History  . Marital status: Single    Spouse name: Not on file  . Number of children: Not on file  . Years of education: Not on file  . Highest education level: Not on file  Occupational History  . Not on file  Tobacco Use  . Smoking status: Never Smoker  . Smokeless tobacco: Never Used  Substance and Sexual Activity  . Alcohol use: Yes    Comment: rarely  . Drug use: No  . Sexual activity: Never  Other Topics Concern  . Not on file  Social History Narrative  . Not on file   Social Determinants of Health   Financial Resource Strain:   . Difficulty of Paying Living Expenses:   Food Insecurity:   . Worried About Charity fundraiser in the Last Year:   . Arboriculturist in the Last Year:   Transportation Needs:   . Film/video editor (Medical):   Marland Kitchen Lack of Transportation (Non-Medical):   Physical Activity:   . Days of Exercise per Week:   . Minutes of Exercise per Session:   Stress:   . Feeling of Stress :   Social Connections:   . Frequency of Communication with Friends and Family:   . Frequency of Social Gatherings with Friends and Family:   . Attends Religious Services:   . Active Member of Clubs or Organizations:   . Attends Archivist Meetings:   Marland Kitchen Marital Status:     Tobacco Counseling Counseling given: Not Answered   Clinical Intake:  Pre-visit preparation completed: Yes  Pain : No/denies pain  Diabetes: No  How often do you need to have someone help you when you read instructions, pamphlets, or other written materials from your doctor or pharmacy?: 1 - Never  Interpreter Needed?: No  Information entered by :: Denman George LPN   Activities of Daily Living In your present state  of health, do you have any difficulty performing the following activities: 02/22/2020 08/20/2019  Hearing? N N  Vision? N N  Difficulty concentrating or making decisions? N N  Walking or climbing stairs? N Y  Comment - -  Dressing or bathing? N N  Doing errands, shopping? N N  Preparing Food and eating ? N -  Using the Toilet? N -  In the past six months, have you accidently leaked urine? N -  Do you have problems with loss of bowel control? N -  Managing your Medications? N -  Managing your Finances? N -  Housekeeping or managing your Housekeeping? N -  Some recent data might be hidden     Immunizations and Health Maintenance Immunization History  Administered Date(s) Administered  . Influenza Inj Mdck Quad Pf 12/15/2014, 01/15/2016  . Influenza, High Dose Seasonal PF 07/21/2018  . Influenza, Seasonal, Injecte, Preservative Fre 11/19/2007  . Influenza,inj,Quad PF,6+ Mos 10/20/2017  . Influenza-Unspecified 10/08/2011  . PPD Test 11/19/2007  . Tdap 11/19/2007, 07/21/2018  . Zoster 07/11/2014   Health Maintenance Due  Topic Date Due  . PNA vac Low Risk Adult (1 of 2 - PCV13) Never done  . MAMMOGRAM  11/28/2018    Patient Care Team: Leamon Arnt, MD as PCP - General (Family Medicine) Sydnee Cabal, MD as Consulting Physician (Orthopedic Surgery)  Indicate any recent Medical Services you may have received from other than [facility] providers in the past year (date may be approximate).     Assessment:   This is a routine wellness examination for Veronica Finley.  Hearing/Vision screen No exam data present  Dietary issues and exercise activities discussed: Current Exercise Habits: The patient does not participate in regular exercise at present  Goals   None    Depression Screen PHQ 2/9 Scores 02/22/2020 04/15/2019 10/26/2018 01/20/2018 10/20/2017  PHQ - 2 Score 0 0 0 0 0  PHQ- 9 Score - - 0 - -    Fall Risk Fall Risk  02/22/2020 07/21/2018  Falls in the past year? 0 No  Number  falls in past yr: 0 -  Injury with Fall? 0 -  Risk for fall due to : Orthopedic patient -  Follow up Falls evaluation completed;Education provided;Falls prevention discussed -    Is the patient's home free of loose throw rugs in walkways, pet beds, electrical cords, etc?   yes      Grab bars in the bathroom? yes      Handrails on the stairs?   yes      Adequate lighting?   yes  Cognitive Function:   6CIT Screen 02/22/2020  What Year? 0 points  What month? 0 points  What time? 0 points  Count back from 20 0 points  Months in reverse 0 points  Repeat phrase 0 points  Total Score 0    Screening Tests Health Maintenance  Topic Date Due  . PNA vac Low Risk Adult (1 of 2 - PCV13) Never done  . MAMMOGRAM  11/28/2018  . INFLUENZA VACCINE  06/18/2020  . DEXA SCAN  11/21/2023  . COLONOSCOPY  03/03/2025  . TETANUS/TDAP  07/21/2028  . Hepatitis C Screening  Completed    Qualifies for Shingles Vaccine? Discussed and patient will check with pharmacy for coverage.  Patient education handout provided   Cancer Screenings: Lung: Low Dose CT Chest recommended if Age 75-80 years, 30 pack-year currently smoking OR have quit w/in 15years. Patient does not qualify. Breast: Up to date on Mammogram? No; patient provided with information to schedule  Up to date of Bone Density/Dexa? Yes Colorectal: colonoscopy 03/04/15    Plan:  I have personally reviewed and addressed the Medicare Annual Wellness questionnaire and have noted the following in the patient's chart:  A. Medical and social history B. Use of alcohol, tobacco or illicit drugs  C. Current medications and supplements D. Functional ability and status E.  Nutritional status F.  Physical activity G. Advance directives H. List of other physicians I.  Hospitalizations, surgeries, and ER visits in previous 12 months J.  [place] such as hearing and vision if needed, cognitive and depression L.  Referrals, records requested,  and appointments- none   In addition, I have reviewed and discussed with patient certain preventive protocols, quality metrics, and best practice recommendations. A written personalized care plan for preventive services as well as general preventive health recommendations were provided to patient.   Signed,  Denman George, LPN  Nurse Health Advisor   Nurse Notes: Patient has completed Covid vaccine Therapist, music)

## 2020-04-11 ENCOUNTER — Telehealth: Payer: Self-pay | Admitting: Family Medicine

## 2020-04-11 NOTE — Telephone Encounter (Signed)
Contacted patient to reschedule her CPE to 08/11/20 and patient would like to know if it would be okay to have labs done the day before or the morning of the appointment due to appointment time being at 2:30 pm.

## 2020-04-11 NOTE — Telephone Encounter (Signed)
Recommend OV now (within1-2 months) for OV for f/u HTN, HLD, chronic problems.  Keep cpe for wellness visit in September. Can get labs at time of visit; fasting or not. Thanks

## 2020-04-12 NOTE — Telephone Encounter (Signed)
Called pt and scheduled OV in June

## 2020-04-14 ENCOUNTER — Other Ambulatory Visit: Payer: Self-pay | Admitting: Family Medicine

## 2020-05-01 ENCOUNTER — Encounter: Payer: Medicare PPO | Admitting: Family Medicine

## 2020-05-05 ENCOUNTER — Other Ambulatory Visit: Payer: Self-pay | Admitting: Family Medicine

## 2020-05-08 ENCOUNTER — Other Ambulatory Visit: Payer: Self-pay

## 2020-05-08 MED ORDER — DILTIAZEM HCL ER COATED BEADS 240 MG PO CP24: 240.0000 mg | ORAL_CAPSULE | Freq: Every day | ORAL | 0 refills | Status: DC

## 2020-05-08 MED ORDER — FLUOXETINE HCL 20 MG PO CAPS: 20.0000 mg | ORAL_CAPSULE | Freq: Every day | ORAL | 0 refills | Status: DC

## 2020-05-08 MED ORDER — ROSUVASTATIN CALCIUM 10 MG PO TABS: 10.0000 mg | ORAL_TABLET | Freq: Every day | ORAL | 0 refills | Status: DC

## 2020-05-10 ENCOUNTER — Other Ambulatory Visit: Payer: Self-pay | Admitting: Family Medicine

## 2020-05-10 ENCOUNTER — Ambulatory Visit: Payer: Medicare PPO | Admitting: Family Medicine

## 2020-05-15 ENCOUNTER — Ambulatory Visit: Payer: Medicare PPO | Admitting: Family Medicine

## 2020-05-16 ENCOUNTER — Other Ambulatory Visit: Payer: Self-pay

## 2020-05-16 ENCOUNTER — Telehealth: Payer: Self-pay | Admitting: Family Medicine

## 2020-05-16 MED ORDER — ROSUVASTATIN CALCIUM 10 MG PO TABS: 10.0000 mg | ORAL_TABLET | Freq: Every day | ORAL | 0 refills | Status: DC

## 2020-05-16 MED ORDER — FLUOXETINE HCL 20 MG PO CAPS: 20.0000 mg | ORAL_CAPSULE | Freq: Every day | ORAL | 0 refills | Status: DC

## 2020-05-16 MED ORDER — LEVOTHYROXINE SODIUM 175 MCG PO TABS: 175.0000 ug | ORAL_TABLET | Freq: Every day | ORAL | 0 refills | Status: DC

## 2020-05-16 MED ORDER — ROPINIROLE HCL ER 4 MG PO TB24: 4.0000 mg | ORAL_TABLET | Freq: Every day | ORAL | 0 refills | Status: DC

## 2020-05-16 MED ORDER — LOSARTAN POTASSIUM-HCTZ 100-25 MG PO TABS: 1.0000 | ORAL_TABLET | Freq: Every day | ORAL | 0 refills | Status: DC

## 2020-05-16 NOTE — Telephone Encounter (Signed)
MEDICATION: cardizem, meloxicam  PH[facility]Y: [facility]  Comments:   **Let patient know to contact pharmacy at the end of the day to make sure medication is ready. **  ** Please notify patient to allow 48-72 hours to process**  **Encourage patient to contact the pharmacy for refills or they can request refills through [facility]**

## 2020-05-16 NOTE — Telephone Encounter (Signed)
Pt has appointment in office tomorrow. Refills can be addressed then.

## 2020-05-17 ENCOUNTER — Ambulatory Visit: Payer: Medicare PPO | Admitting: Family Medicine

## 2020-05-17 ENCOUNTER — Other Ambulatory Visit: Payer: Self-pay

## 2020-05-17 ENCOUNTER — Encounter: Payer: Self-pay | Admitting: Family Medicine

## 2020-05-17 VITALS — BP 116/68 | HR 81 | Temp 97.3°F | Ht 66.0 in | Wt 246.6 lb

## 2020-05-17 DIAGNOSIS — G4733 Obstructive sleep apnea (adult) (pediatric): Secondary | ICD-10-CM

## 2020-05-17 DIAGNOSIS — Z23 Encounter for immunization: Secondary | ICD-10-CM

## 2020-05-17 DIAGNOSIS — Z9884 Bariatric surgery status: Secondary | ICD-10-CM | POA: Diagnosis not present

## 2020-05-17 DIAGNOSIS — F329 Major depressive disorder, single episode, unspecified: Secondary | ICD-10-CM

## 2020-05-17 DIAGNOSIS — E039 Hypothyroidism, unspecified: Secondary | ICD-10-CM

## 2020-05-17 DIAGNOSIS — E782 Mixed hyperlipidemia: Secondary | ICD-10-CM

## 2020-05-17 DIAGNOSIS — E669 Obesity, unspecified: Secondary | ICD-10-CM

## 2020-05-17 DIAGNOSIS — Z1231 Encounter for screening mammogram for malignant neoplasm of breast: Secondary | ICD-10-CM

## 2020-05-17 DIAGNOSIS — Z9989 Dependence on other enabling machines and devices: Secondary | ICD-10-CM

## 2020-05-17 DIAGNOSIS — I10 Essential (primary) hypertension: Secondary | ICD-10-CM

## 2020-05-17 DIAGNOSIS — G2581 Restless legs syndrome: Secondary | ICD-10-CM | POA: Diagnosis not present

## 2020-05-17 LAB — CBC WITH DIFFERENTIAL/PLATELET
Basophils Absolute: 0.1 10*3/uL (ref 0.0–0.1)
Basophils Relative: 0.7 % (ref 0.0–3.0)
Eosinophils Absolute: 0.3 10*3/uL (ref 0.0–0.7)
Eosinophils Relative: 3.6 % (ref 0.0–5.0)
HCT: 42.2 % (ref 36.0–46.0)
Hemoglobin: 14.4 g/dL (ref 12.0–15.0)
Lymphocytes Relative: 30.7 % (ref 12.0–46.0)
Lymphs Abs: 2.3 10*3/uL (ref 0.7–4.0)
MCHC: 34.1 g/dL (ref 30.0–36.0)
MCV: 89.2 fl (ref 78.0–100.0)
Monocytes Absolute: 0.6 10*3/uL (ref 0.1–1.0)
Monocytes Relative: 7.7 % (ref 3.0–12.0)
Neutro Abs: 4.4 10*3/uL (ref 1.4–7.7)
Neutrophils Relative %: 57.3 % (ref 43.0–77.0)
Platelets: 292 10*3/uL (ref 150.0–400.0)
RBC: 4.73 Mil/uL (ref 3.87–5.11)
RDW: 13.4 % (ref 11.5–15.5)
WBC: 7.6 10*3/uL (ref 4.0–10.5)

## 2020-05-17 LAB — LIPID PANEL
Cholesterol: 122 mg/dL (ref 0–200)
HDL: 47.4 mg/dL (ref >39.00)
LDL Cholesterol: 57 mg/dL (ref 0–99)
NonHDL: 75.05
Total CHOL/HDL Ratio: 3
Triglycerides: 91 mg/dL (ref 0.0–149.0)
VLDL: 18.2 mg/dL (ref 0.0–40.0)

## 2020-05-17 LAB — POCT URINALYSIS DIPSTICK
Blood, UA: NEGATIVE
Glucose, UA: NEGATIVE
Ketones, UA: NEGATIVE
Leukocytes, UA: NEGATIVE
Nitrite, UA: NEGATIVE
Protein, UA: NEGATIVE
Spec Grav, UA: >=1.03 — AB (ref 1.010–1.025)
Urobilinogen, UA: 0.2 E.U./dL
pH, UA: 5.5 (ref 5.0–8.0)

## 2020-05-17 LAB — COMPREHENSIVE METABOLIC PANEL
ALT: 18 U/L (ref 0–35)
AST: 20 U/L (ref 0–37)
Albumin: 4.4 g/dL (ref 3.5–5.2)
Alkaline Phosphatase: 102 U/L (ref 39–117)
BUN: 17 mg/dL (ref 6–23)
CO2: 28 mEq/L (ref 19–32)
Calcium: 10.3 mg/dL (ref 8.4–10.5)
Chloride: 103 mEq/L (ref 96–112)
Creatinine, Ser: 0.99 mg/dL (ref 0.40–1.20)
GFR: 55.94 mL/min — ABNORMAL LOW (ref >60.00)
Glucose, Bld: 115 mg/dL — ABNORMAL HIGH (ref 70–99)
Potassium: 4.1 mEq/L (ref 3.5–5.1)
Sodium: 141 mEq/L (ref 135–145)
Total Bilirubin: 0.6 mg/dL (ref 0.2–1.2)
Total Protein: 6.9 g/dL (ref 6.0–8.3)

## 2020-05-17 LAB — VITAMIN B12: Vitamin B-12: 268 pg/mL (ref 211–911)

## 2020-05-17 LAB — VITAMIN D 25 HYDROXY (VIT D DEFICIENCY, FRACTURES): VITD: 55.75 ng/mL (ref 30.00–100.00)

## 2020-05-17 LAB — TSH: TSH: 2.45 u[IU]/mL (ref 0.35–4.50)

## 2020-05-17 MED ORDER — ROSUVASTATIN CALCIUM 10 MG PO TABS: 10.0000 mg | ORAL_TABLET | Freq: Every day | ORAL | 3 refills | Status: DC

## 2020-05-17 MED ORDER — MELOXICAM 15 MG PO TABS: ORAL_TABLET | ORAL | 3 refills | Status: DC

## 2020-05-17 MED ORDER — FLUOXETINE HCL 20 MG PO CAPS: 20.0000 mg | ORAL_CAPSULE | Freq: Every day | ORAL | 3 refills | Status: DC

## 2020-05-17 MED ORDER — DILTIAZEM HCL ER COATED BEADS 240 MG PO CP24: 240.0000 mg | ORAL_CAPSULE | Freq: Every day | ORAL | 3 refills | Status: DC

## 2020-05-17 MED ORDER — ROPINIROLE HCL ER 4 MG PO TB24: 4.0000 mg | ORAL_TABLET | Freq: Every day | ORAL | 3 refills | Status: DC

## 2020-05-17 MED ORDER — LOSARTAN POTASSIUM-HCTZ 100-25 MG PO TABS: 1.0000 | ORAL_TABLET | Freq: Every day | ORAL | 3 refills | Status: DC

## 2020-05-17 NOTE — Progress Notes (Signed)
Please call patient: I have reviewed his/her lab results. Was she fasting? If so, please add a1c. If not, then all labs are fine!

## 2020-05-17 NOTE — Patient Instructions (Signed)
Please return in 5-6 months for CPE.  I will release your lab results to you on your MyChart account with further instructions. Please reply with any questions.   Today you were given your Prevnar vaccination. It is a pneumococcal vaccine and protects your from a serious pneumonia.   It's great seeing you!  If you have any questions or concerns, please don't hesitate to send me a message via MyChart or call the office at [phone]. Thank you for visiting with [place] today! It's our pleasure caring for you.  I have ordered a mammogram and/or bone density for you as we discussed today: [x]   Mammogram  []   Bone Density  Please call the office checked below to schedule your appointment: Your appointment will at the following location  [x]   [facility]      [place], [place]         []   [facility]  [address] [place], [place]

## 2020-05-17 NOTE — Progress Notes (Signed)
Subjective  CC:  Chief Complaint  Patient presents with  . Hypertension    does not check at home. Denies HA, visual changes, and chest pain  . Hypothyroidism    Taking Rosuvastatin 10 mg. no side effects  . Hyperlipidemia  . Arthritis    Last visit 03/2019. Overdue.  HPI: Veronica Finley is a 69 y.o. female who presents to the office today to address the problems listed above in the chief complaint.  Hypertension f/u: Control is good . Pt reports she is doing well. taking medications as instructed, no medication side effects noted, no TIAs, no chest pain on exertion, no dyspnea on exertion, no swelling of ankles. She denies adverse effects from his BP medications. Compliance with medication is good.   HLD on statin: today for labs  Depression is well controlled on prozac  Low thyroid due for recheck. Feels well. No sxs of low or high thyroid  RLS stable on requip. Necessary for sleep.   H/o gastric banding: due for recheck for vitamin deficiencies and Iron deficiency.   Overdue for HM: mammo and prevnar. dexa and CRC up to date. Needs CPE.   OA: doing well after bilateral knee replacements.   Assessment  1. Essential hypertension   2. Mixed hyperlipidemia   3. Acquired hypothyroidism   4. Major depression, chronic   5. Obesity (BMI 30-39.9)   6. Restless legs   7. OSA on CPAP   8. Hx of laparoscopic gastric banding+ truncal vagotomy 03/24/2006      Plan    Chronic problems are all well controlled.  Blood pressure is at goal.  Tolerating medications.  Recheck fasting lipids today.  Recheck thyroid levels today.  No changes in medications.  Check for iron deficiency and vitamin deficiencies given history of gastric banding and restless leg syndrome.  Prevnar updated today.  Mammogram ordered. Education regarding management of these chronic disease states was given. Management strategies discussed on successive visits include dietary and exercise recommendations,  goals of achieving and maintaining IBW, and lifestyle modifications aiming for adequate sleep and minimizing stressors.   Follow up: Return in about 5 months (around 10/17/2020) for complete physical, follow up Hypertension.  Orders Placed This Encounter  Procedures  . CBC with Differential/Platelet  . Comprehensive metabolic panel  . Lipid panel  . TSH  . Iron, TIBC and Ferritin Panel  . VITAMIN D 25 Hydroxy (Vit-D Deficiency, Fractures)  . Vitamin B12  . POCT urinalysis dipstick   No orders of the defined types were placed in this encounter.     BP Readings from Last 3 Encounters:  05/17/20 116/68  08/22/19 107/63  08/12/19 118/64   Wt Readings from Last 3 Encounters:  05/17/20 246 lb 9.6 oz (111.9 kg)  08/20/19 240 lb (108.9 kg)  08/12/19 240 lb (108.9 kg)    Lab Results  Component Value Date   CHOL 118 10/26/2018   CHOL 136 10/20/2017   Lab Results  Component Value Date   HDL 49.90 10/26/2018   HDL 47.10 10/20/2017   Lab Results  Component Value Date   LDLCALC 50 10/26/2018   LDLCALC 64 10/20/2017   Lab Results  Component Value Date   TRIG 90.0 10/26/2018   TRIG 124.0 10/20/2017   Lab Results  Component Value Date   CHOLHDL 2 10/26/2018   CHOLHDL 3 10/20/2017   No results found for: LDLDIRECT Lab Results  Component Value Date   CREATININE 0.99 08/20/2019   BUN 13 08/12/2019  NA 139 08/12/2019   K 4.2 08/12/2019   CL 105 08/12/2019   CO2 25 08/12/2019   Lab Results  Component Value Date   TSH 0.99 10/26/2018    The ASCVD Risk score Mikey Bussing DC Jr., et al., 2013) failed to calculate for the following reasons:   The valid total cholesterol range is 130 to 320 mg/dL  I reviewed the patients updated PMH, FH, and SocHx.    Patient Active Problem List   Diagnosis Date Noted  . OSA on CPAP 10/26/2018    Priority: High  . Hx of laparoscopic gastric banding+ truncal vagotomy 03/24/2006 01/10/2014    Priority: High  . Spinal stenosis of lumbar  region 07/08/2013    Priority: High  . Major depression, chronic 07/08/2013    Priority: High  . Essential hypertension 04/06/2012    Priority: High  . Acquired hypothyroidism 10/08/2011    Priority: High  . Mixed hyperlipidemia 10/08/2011    Priority: High  . Obesity (BMI 30-39.9) 12/17/2010    Priority: High  . S/P knee replacement 04/02/2019    Priority: Medium  . Restless legs 12/20/2010    Priority: Medium  . Osteoarthritis of multiple joints 12/17/2010    Priority: Medium  . Contact dermatitis due to chemicals 01/25/2019    Priority: Low  . Generalized hyperhidrosis 07/15/2016    Priority: Low  . AR (allergic rhinitis) 12/20/2010    Priority: Low    Allergies: Codeine and Thimerosal  Social History: Patient  reports that she has never smoked. She has never used smokeless tobacco. She reports current alcohol use. She reports that she does not use drugs.  Current Meds  Medication Sig  . acetaminophen (TYLENOL) 500 MG tablet Take 500-1,000 mg by mouth every 6 (six) hours as needed (for pain.).  Marland Kitchen Cholecalciferol (VITAMIN D) 2000 units tablet Take 2,000 Units by mouth every evening.   . diltiazem (CARDIZEM CD) 240 MG 24 hr capsule TAKE ONE CAPSULE BY MOUTH DAILY  . FLUoxetine (PROZAC) 20 MG capsule Take 1 capsule (20 mg total) by mouth daily. Needs office visit  . levothyroxine (SYNTHROID) 175 MCG tablet Take 1 tablet (175 mcg total) by mouth daily with breakfast.  . losartan-hydrochlorothiazide (HYZAAR) 100-25 MG tablet Take 1 tablet by mouth daily.  . meloxicam (MOBIC) 15 MG tablet TAKE ONE TABLET BY MOUTH DAILY AS NEEDED FOR PAIN  . methocarbamol (ROBAXIN) 500 MG tablet Take 1 tablet (500 mg total) by mouth 4 (four) times daily.  Marland Kitchen rOPINIRole (REQUIP XL) 4 MG 24 hr tablet Take 1 tablet (4 mg total) by mouth at bedtime.  . rosuvastatin (CRESTOR) 10 MG tablet Take 1 tablet (10 mg total) by mouth at bedtime. Needs to be seen in office    Review of  Systems: Cardiovascular: negative for chest pain, palpitations, leg swelling, orthopnea Respiratory: negative for SOB, wheezing or persistent cough Gastrointestinal: negative for abdominal pain Genitourinary: negative for dysuria or gross hematuria  Objective  Vitals: BP 116/68 (BP Location: Left Arm, Patient Position: Sitting, Cuff Size: Large)   Pulse 81   Temp (!) 97.3 F (36.3 C) (Temporal)   Ht 5\' 6"  (1.676 m)   Wt 246 lb 9.6 oz (111.9 kg)   SpO2 92%   BMI 39.80 kg/m  General: no acute distress  Psych:  Alert and oriented, normal mood and affect HEENT:  Normocephalic, atraumatic, supple neck  Cardiovascular:  RRR without murmur. no edema Respiratory:  Good breath sounds bilaterally, CTAB with normal respiratory effort Skin:  Warm, no rashes Neurologic:   Mental status is normal  Commons side effects, risks, benefits, and alternatives for medications and treatment plan prescribed today were discussed, and the patient expressed understanding of the given instructions. Patient is instructed to call or message via MyChart if he/she has any questions or concerns regarding our treatment plan. No barriers to understanding were identified. We discussed Red Flag symptoms and signs in detail. Patient expressed understanding regarding what to do in case of urgent or emergency type symptoms.   Medication list was reconciled, printed and provided to the patient in AVS. Patient instructions and summary information was reviewed with the patient as documented in the AVS. This note was prepared with assistance of Dragon voice recognition software. Occasional wrong-word or sound-a-like substitutions may have occurred due to the inherent limitations of voice recognition software  This visit occurred during the SARS-CoV-2 public health emergency.  Safety protocols were in place, including screening questions prior to the visit, additional usage of staff PPE, and extensive cleaning of exam room while  observing appropriate contact time as indicated for disinfecting solutions.

## 2020-05-18 ENCOUNTER — Other Ambulatory Visit: Payer: Self-pay

## 2020-05-18 DIAGNOSIS — E1065 Type 1 diabetes mellitus with hyperglycemia: Secondary | ICD-10-CM

## 2020-05-18 LAB — IRON,TIBC AND FERRITIN PANEL
%SAT: 36 % (calc) (ref 16–45)
Ferritin: 23 ng/mL (ref 16–288)
Iron: 117 ug/dL (ref 45–160)
TIBC: 325 mcg/dL (calc) (ref 250–450)

## 2020-05-26 ENCOUNTER — Encounter: Payer: Self-pay | Admitting: Family Medicine

## 2020-05-31 ENCOUNTER — Ambulatory Visit
Admission: RE | Admit: 2020-05-31 | Discharge: 2020-05-31 | Disposition: A | Discharge status: Home / Self Care | Payer: Medicare PPO | Source: Ambulatory Visit | Attending: Family Medicine | Admitting: Family Medicine

## 2020-05-31 ENCOUNTER — Other Ambulatory Visit: Payer: Self-pay

## 2020-05-31 DIAGNOSIS — Z1231 Encounter for screening mammogram for malignant neoplasm of breast: Secondary | ICD-10-CM

## 2020-06-02 ENCOUNTER — Other Ambulatory Visit: Payer: Self-pay

## 2020-06-02 ENCOUNTER — Ambulatory Visit: Payer: Medicare PPO | Admitting: Family Medicine

## 2020-06-02 ENCOUNTER — Encounter: Payer: Self-pay | Admitting: Family Medicine

## 2020-06-02 VITALS — BP 104/58 | HR 76 | Temp 95.5°F | Resp 15 | Ht 66.0 in | Wt 242.4 lb

## 2020-06-02 DIAGNOSIS — M4716 Other spondylosis with myelopathy, lumbar region: Secondary | ICD-10-CM

## 2020-06-02 DIAGNOSIS — I10 Essential (primary) hypertension: Secondary | ICD-10-CM

## 2020-06-02 DIAGNOSIS — M48062 Spinal stenosis, lumbar region with neurogenic claudication: Secondary | ICD-10-CM

## 2020-06-02 DIAGNOSIS — E538 Deficiency of other specified B group vitamins: Secondary | ICD-10-CM | POA: Diagnosis not present

## 2020-06-02 MED ORDER — GABAPENTIN 300 MG PO CAPS
ORAL_CAPSULE | ORAL | 2 refills | Status: DC
Start: 2020-06-02 — End: 2020-09-23

## 2020-06-02 MED ORDER — PREDNISONE 10 MG PO TABS
ORAL_TABLET | ORAL | 0 refills | Status: DC
Start: 2020-06-02 — End: 2020-07-06

## 2020-06-02 MED ORDER — LOSARTAN POTASSIUM-HCTZ 100-25 MG PO TABS: 0.5000 | ORAL_TABLET | Freq: Every day | ORAL | 3 refills | Status: DC

## 2020-06-02 NOTE — Patient Instructions (Signed)
Please follow up if symptoms do not improve or as needed.   Please go to our [facility] office to get your xrays done. You can walk in M-F between 8:30am- noon or 1pm - 5pm. Tell them you are there for xrays ordered by me. They will send me the results, then I will let you know the results with instructions.   Address: [address]  The Xray department is located in the basement.   We will call you to get the MRI scheduled.   Cut your hyzaar pill in half daily.   Let me know if the medications are not helpful.    Spinal Stenosis  Spinal stenosis occurs when the open space (spinal canal) between the bones of your spine (vertebrae) narrows, putting pressure on the spinal cord or nerves. What are the causes? This condition is caused by areas of bone pushing into the central canals of your vertebrae. This condition may be present at birth (congenital), or it may be caused by:  Arthritic deterioration of your vertebrae (spinal degeneration). This usually starts around age 25.  Injury or trauma to the spine.  Tumors in the spine.  Calcium deposits in the spine. What are the signs or symptoms? Symptoms of this condition include:  Pain in the neck or back that is generally worse with activities, particularly when standing and walking.  Numbness, tingling, hot or cold sensations, weakness, or weariness in your legs.  Pain going up and down the leg (sciatica).  Frequent episodes of falling.  A foot-slapping gait that leads to muscle weakness. In more serious cases, you may develop:  Problemspassing stool or passing urine.  Difficulty having sex.  Loss of feeling in part or all of your leg. Symptoms may come on slowly and get worse over time. How is this diagnosed? This condition is diagnosed based on your medical history and a physical exam. Tests will also be done, such as:  MRI.  CT scan.  X-ray. How is this treated? Treatment for this condition  often focuses on managing your pain and any other symptoms. Treatment may include:  Practicing good posture to lessen pressure on your nerves.  Exercising to strengthen muscles, build endurance, improve balance, and maintain good joint movement (range of motion).  Losing weight, if needed.  Taking medicines to reduce swelling, inflammation, or pain.  Assistive devices, such as a corset or brace. In some cases, surgery may be needed. The most common procedure is decompression laminectomy. This is done to remove excess bone that puts pressure on your nerve roots. Follow these instructions at home: Managing pain, stiffness, and swelling  Do all exercises and stretches as told by your health care provider.  Practice good posture. If you were given a brace or a corset, wear it as told by your health care provider.  Do not do any activities that cause pain. Ask your health care provider what activities are safe for you.  Do not lift anything that is heavier than 10 lb (4.5 kg) or the limit that your health care provider tells you.  Maintain a healthy weight. Talk with your health care provider if you need help losing weight.  If directed, apply heat to the affected area as often as told by your health care provider. Use the heat source that your health care provider recommends, such as a moist heat pack or a heating pad. ? Place a towel between your skin and the heat source. ? Leave the  heat on for 20-30 minutes. ? Remove the heat if your skin turns bright red. This is especially important if you are not able to feel pain, heat, or cold. You may have a greater risk of getting burned. General instructions  Take over-the-counter and prescription medicines only as told by your health care provider.  Do not use any products that contain nicotine or tobacco, such as cigarettes and e-cigarettes. If you need help quitting, ask your health care provider.  Eat a healthy diet. This includes plenty  of fruits and vegetables, whole grains, and low-fat (lean) protein.  Keep all follow-up visits as told by your health care provider. This is important. Contact a health care provider if:  Your symptoms do not get better or they get worse.  You have a fever. Get help right away if:  You have new or worse pain in your neck or upper back.  You have severe pain that cannot be controlled with medicines.  You are dizzy.  You have vision problems, blurred vision, or double vision.  You have a severe headache that is worse when you stand.  You have nausea or you vomit.  You develop new or worse numbness or tingling in your back or legs.  You have pain, redness, swelling, or warmth in your arm or leg. Summary  Spinal stenosis occurs when the open space (spinal canal) between the bones of your spine (vertebrae) narrows. This narrowing puts pressure on the spinal cord or nerves.  Spinal stenosis can cause numbness, weakness, or pain in the neck, back, and legs.  This condition may be caused by a birth defect, arthritic deterioration of your vertebrae, injury, tumors, or calcium deposits.  This condition is usually diagnosed with MRIs, CT scans, and X-rays. This information is not intended to replace advice given to you by your health care provider. Make sure you discuss any questions you have with your health care provider. Document Revised: 10/17/2017 Document Reviewed: 10/09/2016 Elsevier Patient Education  2020 [employer].

## 2020-06-02 NOTE — Progress Notes (Signed)
Subjective  CC:  Chief Complaint  Patient presents with  . Hip Pain    bilateral   . Back Pain    bilateral    HPI: Veronica Finley is a 69 y.o. female who presents to the office today to address the problems listed above in the chief complaint.  69 year old complains of worsening low back pain and hip pain.  She has had back problems for years, status post surgery, lumbar fusion 20 years ago.  Over the last year has had mild back pain over the last several months and has significantly worsened.  Worse on the left, radicular symptoms down buttocks and leg.  No weakness.  Pain with walking.  No bowel or bladder dysfunction.  She has known DJD in the lumbar spine and history of spinal stenosis.  Her last imaging testing was greater than 5 years ago.  Pain is limiting activities.  Hypertension: Denies symptoms of lightheadedness or dizziness but on chart review, blood pressures are running on the low side.  Denies palpitations or chest pain.  No nausea or vomiting.  Takes medications as instructed  Reviewed lab work from her recent physical showing low normal vitamin B12 with history of gastric surgery.  Likely due to malabsorption.  Assessment  1. Spinal stenosis of lumbar region with neurogenic claudication   2. Osteoarthritis of lumbar spine with myelopathy   3. Essential hypertension   4. Vitamin B12 deficiency      Plan   Low back pain with radicular symptoms: Check x-rays and lumbar MRI.  To neurosurgery if needed.  Start gabapentin and prednisone.  Hypertension: Can decrease dosage of Hyzaar and monitor blood pressures.  Start vitamin B12 supplements daily.  Follow up: As needed 10/19/2020  Orders Placed This Encounter  Procedures  . DG Lumbar Spine Complete  . MR Lumbar Spine Wo Contrast   Meds ordered this encounter  Medications  . losartan-hydrochlorothiazide (HYZAAR) 100-25 MG tablet    Sig: Take 0.5 tablets by mouth daily.    Dispense:  90 tablet     Refill:  3  . gabapentin (NEURONTIN) 300 MG capsule    Sig: Take 1 capsule (300 mg total) by mouth at bedtime for 14 days, THEN 1 capsule (300 mg total) 2 (two) times daily for 14 days.    Dispense:  60 capsule    Refill:  2  . predniSONE (DELTASONE) 10 MG tablet    Sig: Take 4 tabs qd x 2 days, 3 qd x 2 days, 2 qd x 2d, 1qd x 3 days    Dispense:  21 tablet    Refill:  0      I reviewed the patients updated PMH, FH, and SocHx.    Patient Active Problem List   Diagnosis Date Noted  . OSA on CPAP 10/26/2018    Priority: High  . Hx of laparoscopic gastric banding+ truncal vagotomy 03/24/2006 01/10/2014    Priority: High  . Spinal stenosis of lumbar region 07/08/2013    Priority: High  . Major depression, chronic 07/08/2013    Priority: High  . Essential hypertension 04/06/2012    Priority: High  . Acquired hypothyroidism 10/08/2011    Priority: High  . Mixed hyperlipidemia 10/08/2011    Priority: High  . Obesity (BMI 30-39.9) 12/17/2010    Priority: High  . S/P knee replacement 04/02/2019    Priority: Medium  . Restless legs 12/20/2010    Priority: Medium  . Osteoarthritis of multiple joints 12/17/2010  Priority: Medium  . Contact dermatitis due to chemicals 01/25/2019    Priority: Low  . Generalized hyperhidrosis 07/15/2016    Priority: Low  . AR (allergic rhinitis) 12/20/2010    Priority: Low  . Vitamin B12 deficiency 06/02/2020   Current Meds  Medication Sig  . acetaminophen (TYLENOL) 500 MG tablet Take 500-1,000 mg by mouth every 6 (six) hours as needed (for pain.).  Marland Kitchen Cholecalciferol (VITAMIN D) 2000 units tablet Take 2,000 Units by mouth every evening.   . diltiazem (CARDIZEM CD) 240 MG 24 hr capsule Take 1 capsule (240 mg total) by mouth daily.  Marland Kitchen FLUoxetine (PROZAC) 20 MG capsule Take 1 capsule (20 mg total) by mouth daily. Needs office visit  . levothyroxine (SYNTHROID) 175 MCG tablet Take 1 tablet (175 mcg total) by mouth daily with breakfast.  .  losartan-hydrochlorothiazide (HYZAAR) 100-25 MG tablet Take 0.5 tablets by mouth daily.  . meloxicam (MOBIC) 15 MG tablet TAKE ONE TABLET BY MOUTH DAILY AS NEEDED FOR PAIN  . methocarbamol (ROBAXIN) 500 MG tablet Take 1 tablet (500 mg total) by mouth 4 (four) times daily.  Marland Kitchen rOPINIRole (REQUIP XL) 4 MG 24 hr tablet Take 1 tablet (4 mg total) by mouth at bedtime.  . rosuvastatin (CRESTOR) 10 MG tablet Take 1 tablet (10 mg total) by mouth at bedtime.  . [DISCONTINUED] losartan-hydrochlorothiazide (HYZAAR) 100-25 MG tablet Take 1 tablet by mouth daily.    Allergies: Patient is allergic to codeine and thimerosal. Family History: Patient family history includes Arthritis in her brother, mother, and sister; Atrial fibrillation in her mother; Diabetes in her brother; Heart disease in her father; Hyperlipidemia in her brother and mother; Hypertension in her mother; Leukemia in her father. Social History:  Patient  reports that she has never smoked. She has never used smokeless tobacco. She reports current alcohol use. She reports that she does not use drugs.  Review of Systems: Constitutional: Negative for fever malaise or anorexia Cardiovascular: negative for chest pain Respiratory: negative for SOB or persistent cough Gastrointestinal: negative for abdominal pain  Objective  Vitals: BP (!) 104/58   Pulse 76   Temp (!) 95.5 F (35.3 C) (Temporal)   Resp 15   Ht 5\' 6"  (1.676 m)   Wt 242 lb 6.4 oz (110 kg)   SpO2 94%   BMI 39.12 kg/m  General: no acute distress , A&Ox3 HEENT: PEERL, conjunctiva normal, neck is supple Cardiovascular:  RRR without murmur or gallop.  Respiratory:  Good breath sounds bilaterally, CTAB with normal respiratory effort Back: No tenderness, pain with extension and forward flexion in the lumbar region, negative straight leg raise bilaterally, +2 DTRs bilaterally, antalgic gait Strength 5 out of 5 bilateral lower extremities Skin:  Warm, no rashes     Commons  side effects, risks, benefits, and alternatives for medications and treatment plan prescribed today were discussed, and the patient expressed understanding of the given instructions. Patient is instructed to call or message via MyChart if he/she has any questions or concerns regarding our treatment plan. No barriers to understanding were identified. We discussed Red Flag symptoms and signs in detail. Patient expressed understanding regarding what to do in case of urgent or emergency type symptoms.   Medication list was reconciled, printed and provided to the patient in AVS. Patient instructions and summary information was reviewed with the patient as documented in the AVS. This note was prepared with assistance of Dragon voice recognition software. Occasional wrong-word or sound-a-like substitutions may have occurred due to  the inherent limitations of voice recognition software  This visit occurred during the SARS-CoV-2 public health emergency.  Safety protocols were in place, including screening questions prior to the visit, additional usage of staff PPE, and extensive cleaning of exam room while observing appropriate contact time as indicated for disinfecting solutions.

## 2020-06-07 ENCOUNTER — Other Ambulatory Visit: Payer: Self-pay

## 2020-06-07 ENCOUNTER — Ambulatory Visit (INDEPENDENT_AMBULATORY_CARE_PROVIDER_SITE_OTHER)
Admission: RE | Admit: 2020-06-07 | Discharge: 2020-06-07 | Disposition: A | Discharge status: Home / Self Care | Payer: Medicare PPO | Source: Ambulatory Visit | Attending: Family Medicine | Admitting: Family Medicine

## 2020-06-07 DIAGNOSIS — M48062 Spinal stenosis, lumbar region with neurogenic claudication: Secondary | ICD-10-CM

## 2020-06-07 DIAGNOSIS — M545 Low back pain: Secondary | ICD-10-CM | POA: Diagnosis not present

## 2020-06-07 DIAGNOSIS — M4716 Other spondylosis with myelopathy, lumbar region: Secondary | ICD-10-CM

## 2020-06-21 ENCOUNTER — Other Ambulatory Visit: Payer: Medicare PPO

## 2020-06-26 ENCOUNTER — Ambulatory Visit: Payer: Medicare PPO | Admitting: Family Medicine

## 2020-06-26 ENCOUNTER — Other Ambulatory Visit: Payer: Medicare PPO

## 2020-06-27 ENCOUNTER — Other Ambulatory Visit: Payer: Self-pay

## 2020-06-27 ENCOUNTER — Ambulatory Visit
Admission: RE | Admit: 2020-06-27 | Discharge: 2020-06-27 | Disposition: A | Discharge status: Home / Self Care | Payer: Medicare PPO | Source: Ambulatory Visit | Attending: Family Medicine | Admitting: Family Medicine

## 2020-06-27 DIAGNOSIS — M4716 Other spondylosis with myelopathy, lumbar region: Secondary | ICD-10-CM

## 2020-06-27 DIAGNOSIS — M48062 Spinal stenosis, lumbar region with neurogenic claudication: Secondary | ICD-10-CM

## 2020-06-27 DIAGNOSIS — M48061 Spinal stenosis, lumbar region without neurogenic claudication: Secondary | ICD-10-CM | POA: Diagnosis not present

## 2020-06-28 ENCOUNTER — Encounter: Payer: Self-pay | Admitting: Family Medicine

## 2020-06-28 NOTE — Progress Notes (Signed)
Please call patient: I have reviewed his/her lab results. Lumbar MRI shows mulitlevel DJD with spinal stenosis. This could be causing her pain. I recommend neurosurgery evaluation again. Hopefully getting some relief from pred and gabapentin.  Also shows a renal mass: need left renal ultrasound for further clarification. Please order.

## 2020-06-29 ENCOUNTER — Other Ambulatory Visit: Payer: Self-pay

## 2020-06-29 DIAGNOSIS — M48062 Spinal stenosis, lumbar region with neurogenic claudication: Secondary | ICD-10-CM

## 2020-06-29 DIAGNOSIS — M479 Spondylosis, unspecified: Secondary | ICD-10-CM

## 2020-06-29 DIAGNOSIS — N2889 Other specified disorders of kidney and ureter: Secondary | ICD-10-CM

## 2020-06-29 DIAGNOSIS — M4800 Spinal stenosis, site unspecified: Secondary | ICD-10-CM

## 2020-07-03 ENCOUNTER — Ambulatory Visit
Admission: RE | Admit: 2020-07-03 | Discharge: 2020-07-03 | Disposition: A | Discharge status: Home / Self Care | Payer: Medicare PPO | Source: Ambulatory Visit | Attending: Family Medicine | Admitting: Family Medicine

## 2020-07-03 DIAGNOSIS — N2889 Other specified disorders of kidney and ureter: Secondary | ICD-10-CM

## 2020-07-04 ENCOUNTER — Encounter: Payer: Self-pay | Admitting: Family Medicine

## 2020-07-06 ENCOUNTER — Other Ambulatory Visit: Payer: Self-pay

## 2020-07-06 ENCOUNTER — Telehealth: Payer: Self-pay

## 2020-07-06 DIAGNOSIS — N2889 Other specified disorders of kidney and ureter: Secondary | ICD-10-CM

## 2020-07-06 MED ORDER — PREDNISONE 10 MG PO TABS: ORAL_TABLET | ORAL | 0 refills | Status: DC

## 2020-07-06 NOTE — Telephone Encounter (Signed)
Pt is very worried and would like to be called about test results. I reassured her, that per her mychart message, that Dr. Jonni Sanger promises to get back to her. Pt wants to be talked to today.

## 2020-07-06 NOTE — Telephone Encounter (Signed)
Please call Veronica Finley: please let her know I have a family emergency and thus can't call but: Please refer to urology to further investigate the kidney mass; yes, this could be a renal cancer and we need to know. The good news is that this type of cancer is very treatable.   And the neurosurgeon will be able to discuss her options to help her pain.  We can repeat a prednisone taper if she'd like to help until her appt?  i've pended that order if she wants it.   Thanks.

## 2020-07-06 NOTE — Telephone Encounter (Signed)
Called and LMOVM advising patient to return my call

## 2020-07-06 NOTE — Telephone Encounter (Signed)
Patient aware of imaging results per PCP. STAT referral to urology placed. Prednisone taper sent to [facility].

## 2020-07-09 NOTE — Progress Notes (Signed)
Pt aware; referral to urology for evaluation

## 2020-07-11 ENCOUNTER — Other Ambulatory Visit: Payer: Medicare PPO

## 2020-07-13 ENCOUNTER — Other Ambulatory Visit: Payer: Self-pay | Admitting: Family Medicine

## 2020-07-13 DIAGNOSIS — M48062 Spinal stenosis, lumbar region with neurogenic claudication: Secondary | ICD-10-CM | POA: Diagnosis not present

## 2020-07-14 DIAGNOSIS — D49512 Neoplasm of unspecified behavior of left kidney: Secondary | ICD-10-CM | POA: Diagnosis not present

## 2020-07-14 LAB — BASIC METABOLIC PANEL
BUN: 16 (ref 4–21)
CO2: 30 — AB (ref 13–22)
Chloride: 100 (ref 99–108)
Creatinine: 0.7 (ref 0.5–1.1)
Glucose: 99
Potassium: 4.1 (ref 3.4–5.3)
Sodium: 137 (ref 137–147)

## 2020-07-14 LAB — HEPATIC FUNCTION PANEL
ALT: 20 (ref 7–35)
AST: 20 (ref 13–35)
Alkaline Phosphatase: 96 (ref 25–125)
Bilirubin, Total: 0.6

## 2020-07-14 LAB — COMPREHENSIVE METABOLIC PANEL
Albumin: 4.3 (ref 3.5–5.0)
Calcium: 9.5 (ref 8.7–10.7)
GFR calc Af Amer: 103.9
GFR calc non Af Amer: 89.7

## 2020-07-19 ENCOUNTER — Other Ambulatory Visit: Payer: Self-pay | Admitting: Urology

## 2020-07-20 DIAGNOSIS — M48062 Spinal stenosis, lumbar region with neurogenic claudication: Secondary | ICD-10-CM | POA: Diagnosis not present

## 2020-07-20 DIAGNOSIS — M545 Low back pain: Secondary | ICD-10-CM | POA: Diagnosis not present

## 2020-07-25 ENCOUNTER — Other Ambulatory Visit (HOSPITAL_COMMUNITY): Payer: Self-pay | Admitting: Urology

## 2020-07-25 DIAGNOSIS — D49512 Neoplasm of unspecified behavior of left kidney: Secondary | ICD-10-CM

## 2020-07-27 DIAGNOSIS — M48062 Spinal stenosis, lumbar region with neurogenic claudication: Secondary | ICD-10-CM | POA: Diagnosis not present

## 2020-07-27 DIAGNOSIS — M545 Low back pain: Secondary | ICD-10-CM | POA: Diagnosis not present

## 2020-07-31 DIAGNOSIS — M545 Low back pain: Secondary | ICD-10-CM | POA: Diagnosis not present

## 2020-07-31 DIAGNOSIS — M48062 Spinal stenosis, lumbar region with neurogenic claudication: Secondary | ICD-10-CM | POA: Diagnosis not present

## 2020-08-01 ENCOUNTER — Other Ambulatory Visit: Payer: Self-pay

## 2020-08-01 ENCOUNTER — Ambulatory Visit (HOSPITAL_COMMUNITY)
Admission: RE | Admit: 2020-08-01 | Discharge: 2020-08-01 | Disposition: A | Discharge status: Home / Self Care | Payer: Medicare PPO | Source: Ambulatory Visit | Attending: Urology | Admitting: Urology

## 2020-08-01 DIAGNOSIS — K8020 Calculus of gallbladder without cholecystitis without obstruction: Secondary | ICD-10-CM | POA: Diagnosis not present

## 2020-08-01 DIAGNOSIS — K439 Ventral hernia without obstruction or gangrene: Secondary | ICD-10-CM | POA: Diagnosis not present

## 2020-08-01 DIAGNOSIS — K429 Umbilical hernia without obstruction or gangrene: Secondary | ICD-10-CM | POA: Diagnosis not present

## 2020-08-01 DIAGNOSIS — D49512 Neoplasm of unspecified behavior of left kidney: Secondary | ICD-10-CM | POA: Insufficient documentation

## 2020-08-01 DIAGNOSIS — K449 Diaphragmatic hernia without obstruction or gangrene: Secondary | ICD-10-CM | POA: Diagnosis not present

## 2020-08-01 MED ORDER — GADOBUTROL 1 MMOL/ML IV SOLN
10.0000 mL | Freq: Once | INTRAVENOUS | Status: AC | PRN
  Administered 2020-08-01: 10 mL via INTRAVENOUS

## 2020-08-03 DIAGNOSIS — M48062 Spinal stenosis, lumbar region with neurogenic claudication: Secondary | ICD-10-CM | POA: Diagnosis not present

## 2020-08-03 DIAGNOSIS — M545 Low back pain: Secondary | ICD-10-CM | POA: Diagnosis not present

## 2020-08-07 DIAGNOSIS — M48062 Spinal stenosis, lumbar region with neurogenic claudication: Secondary | ICD-10-CM | POA: Diagnosis not present

## 2020-08-07 DIAGNOSIS — M545 Low back pain: Secondary | ICD-10-CM | POA: Diagnosis not present

## 2020-08-08 DIAGNOSIS — M48062 Spinal stenosis, lumbar region with neurogenic claudication: Secondary | ICD-10-CM | POA: Diagnosis not present

## 2020-08-08 DIAGNOSIS — Z6841 Body Mass Index (BMI) 40.0 and over, adult: Secondary | ICD-10-CM | POA: Diagnosis not present

## 2020-08-08 DIAGNOSIS — I10 Essential (primary) hypertension: Secondary | ICD-10-CM | POA: Diagnosis not present

## 2020-08-09 DIAGNOSIS — M48062 Spinal stenosis, lumbar region with neurogenic claudication: Secondary | ICD-10-CM | POA: Diagnosis not present

## 2020-08-09 DIAGNOSIS — I10 Essential (primary) hypertension: Secondary | ICD-10-CM | POA: Diagnosis not present

## 2020-08-09 DIAGNOSIS — Z6841 Body Mass Index (BMI) 40.0 and over, adult: Secondary | ICD-10-CM | POA: Diagnosis not present

## 2020-08-10 DIAGNOSIS — M48062 Spinal stenosis, lumbar region with neurogenic claudication: Secondary | ICD-10-CM | POA: Diagnosis not present

## 2020-08-10 DIAGNOSIS — M545 Low back pain: Secondary | ICD-10-CM | POA: Diagnosis not present

## 2020-08-11 ENCOUNTER — Encounter: Payer: Medicare PPO | Admitting: Family Medicine

## 2020-08-14 ENCOUNTER — Encounter: Payer: Self-pay | Admitting: Family Medicine

## 2020-08-14 DIAGNOSIS — M545 Low back pain: Secondary | ICD-10-CM | POA: Diagnosis not present

## 2020-08-14 DIAGNOSIS — M48062 Spinal stenosis, lumbar region with neurogenic claudication: Secondary | ICD-10-CM | POA: Diagnosis not present

## 2020-08-14 MED ORDER — PREDNISONE 10 MG PO TABS: ORAL_TABLET | ORAL | 0 refills | Status: DC

## 2020-08-16 DIAGNOSIS — G4733 Obstructive sleep apnea (adult) (pediatric): Secondary | ICD-10-CM | POA: Diagnosis not present

## 2020-08-16 DIAGNOSIS — M48062 Spinal stenosis, lumbar region with neurogenic claudication: Secondary | ICD-10-CM | POA: Diagnosis not present

## 2020-08-28 ENCOUNTER — Other Ambulatory Visit: Payer: Medicare PPO

## 2020-08-28 ENCOUNTER — Telehealth: Payer: Self-pay

## 2020-08-28 DIAGNOSIS — Z20822 Contact with and (suspected) exposure to covid-19: Secondary | ICD-10-CM

## 2020-08-28 DIAGNOSIS — U071 COVID-19: Secondary | ICD-10-CM

## 2020-08-28 HISTORY — DX: COVID-19: U07.1

## 2020-08-28 NOTE — Telephone Encounter (Signed)
Pt states she is getting a cold and when she gets a cold she usually develops an ear infection. She is wanting to get her ear checked, but has not been covid tested. How should we proceed with scheduling?

## 2020-08-28 NOTE — Telephone Encounter (Signed)
Patient will need a COVID test before coming into the office or she can go to urgent care

## 2020-08-29 LAB — SARS-COV-2, NAA 2 DAY TAT

## 2020-08-29 LAB — NOVEL CORONAVIRUS, NAA: SARS-CoV-2, NAA: DETECTED — AB

## 2020-08-30 ENCOUNTER — Other Ambulatory Visit: Payer: Self-pay | Admitting: Physician Assistant

## 2020-08-30 ENCOUNTER — Encounter: Payer: Self-pay | Admitting: Physician Assistant

## 2020-08-30 DIAGNOSIS — U071 COVID-19: Secondary | ICD-10-CM

## 2020-08-30 DIAGNOSIS — I10 Essential (primary) hypertension: Secondary | ICD-10-CM

## 2020-08-30 DIAGNOSIS — E669 Obesity, unspecified: Secondary | ICD-10-CM

## 2020-08-30 NOTE — Progress Notes (Signed)
I connected by phone with Veronica Finley on 08/30/2020 at 1:20 PM to discuss the potential use of a new treatment for mild to moderate COVID-19 viral infection in non-hospitalized patients.  This patient is a 69 y.o. female that meets the FDA criteria for Emergency Use Authorization of COVID monoclonal antibody casirivimab/imdevimab or bamlamivimab/estevimab.  Has a (+) direct SARS-CoV-2 viral test result  Has mild or moderate COVID-19   Is NOT hospitalized due to COVID-19  Is within 10 days of symptom onset  Has at least one of the high risk factor(s) for progression to severe COVID-19 and/or hospitalization as defined in EUA.  Specific high risk criteria : Older age (>/= 67 yo), BMI > 25 and Cardiovascular disease or hypertension   I have spoken and communicated the following to the patient or parent/caregiver regarding COVID monoclonal antibody treatment:  1. FDA has authorized the emergency use for the treatment of mild to moderate COVID-19 in adults and pediatric patients with positive results of direct SARS-CoV-2 viral testing who are 17 years of age and older weighing at least 40 kg, and who are at high risk for progressing to severe COVID-19 and/or hospitalization.  2. The significant known and potential risks and benefits of COVID monoclonal antibody, and the extent to which such potential risks and benefits are unknown.  3. Information on available alternative treatments and the risks and benefits of those alternatives, including clinical trials.  4. Patients treated with COVID monoclonal antibody should continue to self-isolate and use infection control measures (e.g., wear mask, isolate, social distance, avoid sharing personal items, clean and disinfect "high touch" surfaces, and frequent handwashing) according to CDC guidelines.   5. The patient or parent/caregiver has the option to accept or refuse COVID monoclonal antibody treatment.  After reviewing this information  with the patient, the patient has agreed to receive one of the available covid 19 monoclonal antibodies and will be provided an appropriate fact sheet prior to infusion.  Sx onset 10/8. Set up for infusion on 10/14 @ 10:30am. Directions given to [facility]. Pt is aware that insurance will be charged an infusion fee. Pt is vaccinated with [employer].   [name] 08/30/2020 1:20 PM

## 2020-08-31 ENCOUNTER — Ambulatory Visit (HOSPITAL_COMMUNITY)
Admission: RE | Admit: 2020-08-31 | Discharge: 2020-08-31 | Disposition: A | Discharge status: Home / Self Care | Payer: Medicare Other | Source: Ambulatory Visit | Attending: Pulmonary Disease | Admitting: Pulmonary Disease

## 2020-08-31 DIAGNOSIS — Z23 Encounter for immunization: Secondary | ICD-10-CM | POA: Insufficient documentation

## 2020-08-31 DIAGNOSIS — E669 Obesity, unspecified: Secondary | ICD-10-CM | POA: Diagnosis present

## 2020-08-31 DIAGNOSIS — I10 Essential (primary) hypertension: Secondary | ICD-10-CM | POA: Diagnosis present

## 2020-08-31 DIAGNOSIS — U071 COVID-19: Secondary | ICD-10-CM

## 2020-08-31 MED ORDER — EPINEPHRINE 0.3 MG/0.3ML IJ SOAJ: 0.3000 mg | Freq: Once | INTRAMUSCULAR | Status: DC | PRN

## 2020-08-31 MED ORDER — DIPHENHYDRAMINE HCL 50 MG/ML IJ SOLN: 50.0000 mg | Freq: Once | INTRAMUSCULAR | Status: DC | PRN

## 2020-08-31 MED ORDER — SODIUM CHLORIDE 0.9 % IV SOLN: Freq: Once | INTRAVENOUS | Status: AC

## 2020-08-31 MED ORDER — FAMOTIDINE IN NACL 20-0.9 MG/50ML-% IV SOLN: 20.0000 mg | Freq: Once | INTRAVENOUS | Status: DC | PRN

## 2020-08-31 MED ORDER — SODIUM CHLORIDE 0.9 % IV SOLN: INTRAVENOUS | Status: DC | PRN

## 2020-08-31 MED ORDER — METHYLPREDNISOLONE SODIUM SUCC 125 MG IJ SOLR: 125.0000 mg | Freq: Once | INTRAMUSCULAR | Status: DC | PRN

## 2020-08-31 MED ORDER — ALBUTEROL SULFATE HFA 108 (90 BASE) MCG/ACT IN AERS: 2.0000 | INHALATION_SPRAY | Freq: Once | RESPIRATORY_TRACT | Status: DC | PRN

## 2020-08-31 NOTE — Discharge Instructions (Signed)
COVID-19 COVID-19 is a respiratory infection that is caused by a virus called severe acute respiratory syndrome coronavirus 2 (SARS-CoV-2). The disease is also known as coronavirus disease or novel coronavirus. In some people, the virus may not cause any symptoms. In others, it may cause a serious infection. The infection can get worse quickly and can lead to complications, such as:  Pneumonia, or infection of the lungs.  Acute respiratory distress syndrome or ARDS. This is a condition in which fluid build-up in the lungs prevents the lungs from filling with air and passing oxygen into the blood.  Acute respiratory failure. This is a condition in which there is not enough oxygen passing from the lungs to the body or when carbon dioxide is not passing from the lungs out of the body.  Sepsis or septic shock. This is a serious bodily reaction to an infection.  Blood clotting problems.  Secondary infections due to bacteria or fungus.  Organ failure. This is when your body's organs stop working. The virus that causes COVID-19 is contagious. This means that it can spread from person to person through droplets from coughs and sneezes (respiratory secretions). What are the causes? This illness is caused by a virus. You may catch the virus by:  Breathing in droplets from an infected person. Droplets can be spread by a person breathing, speaking, singing, coughing, or sneezing.  Touching something, like a table or a doorknob, that was exposed to the virus (contaminated) and then touching your mouth, nose, or eyes. What increases the risk? Risk for infection You are more likely to be infected with this virus if you:  Are within 6 feet (2 meters) of a person with COVID-19.  Provide care for or live with a person who is infected with COVID-19.  Spend time in crowded indoor spaces or live in shared housing. Risk for serious illness You are more likely to become seriously ill from the virus if  you:  Are 50 years of age or older. The higher your age, the more you are at risk for serious illness.  Live in a nursing home or long-term care facility.  Have cancer.  Have a long-term (chronic) disease such as: ? Chronic lung disease, including chronic obstructive pulmonary disease or asthma. ? A long-term disease that lowers your body's ability to fight infection (immunocompromised). ? Heart disease, including heart failure, a condition in which the arteries that lead to the heart become narrow or blocked (coronary artery disease), a disease which makes the heart muscle thick, weak, or stiff (cardiomyopathy). ? Diabetes. ? Chronic kidney disease. ? Sickle cell disease, a condition in which red blood cells have an abnormal "sickle" shape. ? Liver disease.  Are obese. What are the signs or symptoms? Symptoms of this condition can range from mild to severe. Symptoms may appear any time from 2 to 14 days after being exposed to the virus. They include:  A fever or chills.  A cough.  Difficulty breathing.  Headaches, body aches, or muscle aches.  Runny or stuffy (congested) nose.  A sore throat.  New loss of taste or smell. Some people may also have stomach problems, such as nausea, vomiting, or diarrhea. Other people may not have any symptoms of COVID-19. How is this diagnosed? This condition may be diagnosed based on:  Your signs and symptoms, especially if: ? You live in an area with a COVID-19 outbreak. ? You recently traveled to or from an area where the virus is common. ? You   provide care for or live with a person who was diagnosed with COVID-19. ? You were exposed to a person who was diagnosed with COVID-19.  A physical exam.  Lab tests, which may include: ? Taking a sample of fluid from the back of your nose and throat (nasopharyngeal fluid), your nose, or your throat using a swab. ? A sample of mucus from your lungs (sputum). ? Blood tests.  Imaging tests,  which may include, X-rays, CT scan, or ultrasound. How is this treated? At present, there is no medicine to treat COVID-19. Medicines that treat other diseases are being used on a trial basis to see if they are effective against COVID-19. Your health care provider will talk with you about ways to treat your symptoms. For most people, the infection is mild and can be managed at home with rest, fluids, and over-the-counter medicines. Treatment for a serious infection usually takes places in a hospital intensive care unit (ICU). It may include one or more of the following treatments. These treatments are given until your symptoms improve.  Receiving fluids and medicines through an IV.  Supplemental oxygen. Extra oxygen is given through a tube in the nose, a face mask, or a hood.  Positioning you to lie on your stomach (prone position). This makes it easier for oxygen to get into the lungs.  Continuous positive airway pressure (CPAP) or bi-level positive airway pressure (BPAP) machine. This treatment uses mild air pressure to keep the airways open. A tube that is connected to a motor delivers oxygen to the body.  Ventilator. This treatment moves air into and out of the lungs by using a tube that is placed in your windpipe.  Tracheostomy. This is a procedure to create a hole in the neck so that a breathing tube can be inserted.  Extracorporeal membrane oxygenation (ECMO). This procedure gives the lungs a chance to recover by taking over the functions of the heart and lungs. It supplies oxygen to the body and removes carbon dioxide. Follow these instructions at home: Lifestyle  If you are sick, stay home except to get medical care. Your health care provider will tell you how long to stay home. Call your health care provider before you go for medical care.  Rest at home as told by your health care provider.  Do not use any products that contain nicotine or tobacco, such as cigarettes,  e-cigarettes, and chewing tobacco. If you need help quitting, ask your health care provider.  Return to your normal activities as told by your health care provider. Ask your health care provider what activities are safe for you. General instructions  Take over-the-counter and prescription medicines only as told by your health care provider.  Drink enough fluid to keep your urine pale yellow.  Keep all follow-up visits as told by your health care provider. This is important. How is this prevented?  There is no vaccine to help prevent COVID-19 infection. However, there are steps you can take to protect yourself and others from this virus. To protect yourself:   Do not travel to areas where COVID-19 is a risk. The areas where COVID-19 is reported change often. To identify high-risk areas and travel restrictions, check the CDC travel website: wwwnc.cdc.gov/travel/notices  If you live in, or must travel to, an area where COVID-19 is a risk, take precautions to avoid infection. ? Stay away from people who are sick. ? Wash your hands often with soap and water for 20 seconds. If soap and water   are not available, use an alcohol-based hand sanitizer. ? Avoid touching your mouth, face, eyes, or nose. ? Avoid going out in public, follow guidance from your state and local health authorities. ? If you must go out in public, wear a cloth face covering or face mask. Make sure your mask covers your nose and mouth. ? Avoid crowded indoor spaces. Stay at least 6 feet (2 meters) away from others. ? Disinfect objects and surfaces that are frequently touched every day. This may include:  Counters and tables.  Doorknobs and light switches.  Sinks and faucets.  Electronics, such as phones, remote controls, keyboards, computers, and tablets. To protect others: If you have symptoms of COVID-19, take steps to prevent the virus from spreading to others.  If you think you have a COVID-19 infection, contact  your health care provider right away. Tell your health care team that you think you may have a COVID-19 infection.  Stay home. Leave your house only to seek medical care. Do not use public transport.  Do not travel while you are sick.  Wash your hands often with soap and water for 20 seconds. If soap and water are not available, use alcohol-based hand sanitizer.  Stay away from other members of your household. Let healthy household members care for children and pets, if possible. If you have to care for children or pets, wash your hands often and wear a mask. If possible, stay in your own room, separate from others. Use a different bathroom.  Make sure that all people in your household wash their hands well and often.  Cough or sneeze into a tissue or your sleeve or elbow. Do not cough or sneeze into your hand or into the air.  Wear a cloth face covering or face mask. Make sure your mask covers your nose and mouth. Where to find more information  Centers for Disease Control and Prevention: www.cdc.gov/coronavirus/2019-ncov/index.html  World Health Organization: www.who.int/health-topics/coronavirus Contact a health care provider if:  You live in or have traveled to an area where COVID-19 is a risk and you have symptoms of the infection.  You have had contact with someone who has COVID-19 and you have symptoms of the infection. Get help right away if:  You have trouble breathing.  You have pain or pressure in your chest.  You have confusion.  You have bluish lips and fingernails.  You have difficulty waking from sleep.  You have symptoms that get worse. These symptoms may represent a serious problem that is an emergency. Do not wait to see if the symptoms will go away. Get medical help right away. Call your local emergency services (911 in the [place] Do not drive yourself to the hospital. Let the emergency medical personnel know if you think you have  COVID-19. Summary  COVID-19 is a respiratory infection that is caused by a virus. It is also known as coronavirus disease or novel coronavirus. It can cause serious infections, such as pneumonia, acute respiratory distress syndrome, acute respiratory failure, or sepsis.  The virus that causes COVID-19 is contagious. This means that it can spread from person to person through droplets from breathing, speaking, singing, coughing, or sneezing.  You are more likely to develop a serious illness if you are 50 years of age or older, have a weak immune system, live in a nursing home, or have chronic disease.  There is no medicine to treat COVID-19. Your health care provider will talk with you about ways to treat your symptoms.    Take steps to protect yourself and others from infection. Wash your hands often and disinfect objects and surfaces that are frequently touched every day. Stay away from people who are sick and wear a mask if you are sick. This information is not intended to replace advice given to you by your health care provider. Make sure you discuss any questions you have with your health care provider. Document Revised: 09/03/2019 Document Reviewed: 12/10/2018 [employer] Patient Education  2020 [employer] What types of side effects do monoclonal antibody drugs cause?  Common side effects  In general, the more common side effects caused by monoclonal antibody drugs include: . Allergic reactions, such as hives or itching . Flu-like signs and symptoms, including chills, fatigue, fever, and muscle aches and pains . Nausea, vomiting . Diarrhea . Skin rashes . Low blood pressure   The CDC is recommending patients who receive monoclonal antibody treatments wait at least 90 days before being vaccinated.  Currently, there are no data on the safety and efficacy of mRNA COVID-19 vaccines in persons who received monoclonal antibodies or convalescent plasma as part of COVID-19 treatment. Based  on the estimated half-life of such therapies as well as evidence suggesting that reinfection is uncommon in the 90 days after initial infection, vaccination should be deferred for at least 90 days, as a precautionary measure until additional information becomes available, to avoid interference of the antibody treatment with vaccine-induced immune responses. 

## 2020-08-31 NOTE — Progress Notes (Signed)
  Diagnosis: COVID-19  Physician: Dr Joya Gaskins  Procedure: [facility] Med: bamlanivimab\etesevimab infusion - Provided patient with bamlanimivab\etesevimab fact sheet for patients, parents and caregivers prior to infusion.  Complications: No immediate complications noted.  Discharge: Discharged home   Prosser 08/31/2020

## 2020-09-07 ENCOUNTER — Other Ambulatory Visit: Payer: Medicare Other

## 2020-09-07 DIAGNOSIS — Z20822 Contact with and (suspected) exposure to covid-19: Secondary | ICD-10-CM

## 2020-09-08 LAB — NOVEL CORONAVIRUS, NAA: SARS-CoV-2, NAA: DETECTED — AB

## 2020-09-08 LAB — SARS-COV-2, NAA 2 DAY TAT

## 2020-09-11 ENCOUNTER — Other Ambulatory Visit: Payer: Medicare Other

## 2020-09-11 DIAGNOSIS — Z20822 Contact with and (suspected) exposure to covid-19: Secondary | ICD-10-CM

## 2020-09-12 LAB — SARS-COV-2, NAA 2 DAY TAT

## 2020-09-12 LAB — NOVEL CORONAVIRUS, NAA: SARS-CoV-2, NAA: NOT DETECTED

## 2020-09-14 NOTE — Patient Instructions (Addendum)
DUE TO COVID-19 ONLY ONE VISITOR IS ALLOWED TO COME WITH YOU AND STAY IN THE WAITING ROOM ONLY DURING PRE OP AND PROCEDURE.   IF YOU WILL BE ADMITTED INTO THE HOSPITAL YOU ARE ALLOWED ONE SUPPORT PERSON DURING VISITATION HOURS ONLY (10AM -8PM)   . The support person may change daily. . The support person must pass our screening, gel in and out, and wear a mask at all times, including in the patient's room. . Patients must also wear a mask when staff or their support person are in the room.    Your procedure is scheduled on:  Friday, 09-22-20   Report to [facility] Main  Entrance   Report to admitting at 10:30 AM   Call this number if you have problems the morning of surgery [phone]   Do not eat food :After Midnight.   May have liquids until 9:30 AM day of surgery  CLEAR LIQUID DIET  Foods Allowed                                                                     Foods Excluded  Water, Black Coffee and tea, regular and decaf               liquids that you cannot  Plain Jell-O in any flavor  (No red)                                     see through such as: Fruit ices (not with fruit pulp)                                      milk, soups, orange juice              Iced Popsicles (No red)                                      All solid food                                   Apple juices Sports drinks like Gatorade (No red) Lightly seasoned clear broth or consume(fat free) Sugar, honey syrup   Oral Hygiene is also important to reduce your risk of infection.                                    Remember - BRUSH YOUR TEETH THE MORNING OF SURGERY WITH YOUR REGULAR TOOTHPASTE   Do NOT smoke after Midnight   Take these medicines the morning of surgery with A SIP OF WATER:  Diltiazem, Fluoxetine, Levothyroxine                                You may not have any metal on your body including hair pins, jewelry, and body piercings  Do not wear make-up, lotions,  powders, perfumes/cologne, or deodorant             Do not wear nail polish.  Do not shave  48 hours prior to surgery.     Do not bring valuables to the hospital. [place].   Contacts, dentures or bridgework may not be worn into surgery.   Bring small overnight bag day of surgery.                 Please read over the following fact sheets you were given: IF YOU HAVE QUESTIONS ABOUT YOUR PRE OP INSTRUCTIONS PLEASE CALL [phone]   [place] - Preparing for Surgery Before surgery, you can play an important role.  Because skin is not sterile, your skin needs to be as free of germs as possible.  You can reduce the number of germs on your skin by washing with CHG (chlorahexidine gluconate) soap before surgery.  CHG is an antiseptic cleaner which kills germs and bonds with the skin to continue killing germs even after washing. Please DO NOT use if you have an allergy to CHG or antibacterial soaps.  If your skin becomes reddened/irritated stop using the CHG and inform your nurse when you arrive at Short Stay. Do not shave (including legs and underarms) for at least 48 hours prior to the first CHG shower.  You may shave your face/neck.  Please follow these instructions carefully:  1.  Shower with CHG Soap the night before surgery and the  morning of surgery.  2.  If you choose to wash your hair, wash your hair first as usual with your normal  shampoo.  3.  After you shampoo, rinse your hair and body thoroughly to remove the shampoo.                             4.  Use CHG as you would any other liquid soap.  You can apply chg directly to the skin and wash.  Gently with a scrungie or clean washcloth.  5.  Apply the CHG Soap to your body ONLY FROM THE NECK DOWN.   Do   not use on face/ open                           Wound or open sores. Avoid contact with eyes, ears mouth and   genitals (private parts).                       Wash face,  Genitals (private  parts) with your normal soap.             6.  Wash thoroughly, paying special attention to the area where your    surgery  will be performed.  7.  Thoroughly rinse your body with warm water from the neck down.  8.  DO NOT shower/wash with your normal soap after using and rinsing off the CHG Soap.                9.  Pat yourself dry with a clean towel.            10.  Wear clean pajamas.            11.  Place clean sheets on your bed the night of your first shower and do not  sleep with pets. Day of Surgery : Do not apply any lotions/deodorants the morning of surgery.  Please wear clean clothes to the hospital/surgery center.  FAILURE TO FOLLOW THESE INSTRUCTIONS MAY RESULT IN THE CANCELLATION OF YOUR SURGERY  PATIENT SIGNATURE_________________________________  NURSE SIGNATURE__________________________________  ________________________________________________________________________   Veronica Finley  An incentive spirometer is a tool that can help keep your lungs clear and active. This tool measures how well you are filling your lungs with each breath. Taking long deep breaths may help reverse or decrease the chance of developing breathing (pulmonary) problems (especially infection) following:  A long period of time when you are unable to move or be active. BEFORE THE PROCEDURE   If the spirometer includes an indicator to show your best effort, your nurse or respiratory therapist will set it to a desired goal.  If possible, sit up straight or lean slightly forward. Try not to slouch.  Hold the incentive spirometer in an upright position. INSTRUCTIONS FOR USE  1. Sit on the edge of your bed if possible, or sit up as far as you can in bed or on a chair. 2. Hold the incentive spirometer in an upright position. 3. Breathe out normally. 4. Place the mouthpiece in your mouth and seal your lips tightly around it. 5. Breathe in slowly and as deeply as possible, raising the piston or  the ball toward the top of the column. 6. Hold your breath for 3-5 seconds or for as long as possible. Allow the piston or ball to fall to the bottom of the column. 7. Remove the mouthpiece from your mouth and breathe out normally. 8. Rest for a few seconds and repeat Steps 1 through 7 at least 10 times every 1-2 hours when you are awake. Take your time and take a few normal breaths between deep breaths. 9. The spirometer may include an indicator to show your best effort. Use the indicator as a goal to work toward during each repetition. 10. After each set of 10 deep breaths, practice coughing to be sure your lungs are clear. If you have an incision (the cut made at the time of surgery), support your incision when coughing by placing a pillow or rolled up towels firmly against it. Once you are able to get out of bed, walk around indoors and cough well. You may stop using the incentive spirometer when instructed by your caregiver.  RISKS AND COMPLICATIONS  Take your time so you do not get dizzy or light-headed.  If you are in pain, you may need to take or ask for pain medication before doing incentive spirometry. It is harder to take a deep breath if you are having pain. AFTER USE  Rest and breathe slowly and easily.  It can be helpful to keep track of a log of your progress. Your caregiver can provide you with a simple table to help with this. If you are using the spirometer at home, follow these instructions: Veronica Finley IF:   You are having difficultly using the spirometer.  You have trouble using the spirometer as often as instructed.  Your pain medication is not giving enough relief while using the spirometer.  You develop fever of 100.5 F (38.1 C) or higher. SEEK IMMEDIATE MEDICAL CARE IF:   You cough up bloody sputum that had not been present before.  You develop fever of 102 F (38.9 C) or greater.  You develop worsening pain at or near the incision site. MAKE SURE  YOU:  Understand these instructions.  Will watch your condition.  Will get help right away if you are not doing well or get worse. Document Released: 03/17/2007 Document Revised: 01/27/2012 Document Reviewed: 05/18/2007 Exit[employer] Patient Information 2014 [employer], [place].   ________________________________________________________________________  WHAT IS A BLOOD TRANSFUSION? Blood Transfusion Information  A transfusion is the replacement of blood or some of its parts. Blood is made up of multiple cells which provide different functions.  Red blood cells carry oxygen and are used for blood loss replacement.  White blood cells fight against infection.  Platelets control bleeding.  Plasma helps clot blood.  Other blood products are available for specialized needs, such as hemophilia or other clotting disorders. BEFORE THE TRANSFUSION  Who gives blood for transfusions?   Healthy volunteers who are fully evaluated to make sure their blood is safe. This is blood bank blood. Transfusion therapy is the safest it has ever been in the practice of medicine. Before blood is taken from a donor, a complete history is taken to make sure that person has no history of diseases nor engages in risky social behavior (examples are intravenous drug use or sexual activity with multiple partners). The donor's travel history is screened to minimize risk of transmitting infections, such as malaria. The donated blood is tested for signs of infectious diseases, such as HIV and hepatitis. The blood is then tested to be sure it is compatible with you in order to minimize the chance of a transfusion reaction. If you or a relative donates blood, this is often done in anticipation of surgery and is not appropriate for emergency situations. It takes many days to process the donated blood. RISKS AND COMPLICATIONS Although transfusion therapy is very safe and saves many lives, the main dangers of transfusion include:    Getting an infectious disease.  Developing a transfusion reaction. This is an allergic reaction to something in the blood you were given. Every precaution is taken to prevent this. The decision to have a blood transfusion has been considered carefully by your caregiver before blood is given. Blood is not given unless the benefits outweigh the risks. AFTER THE TRANSFUSION  Right after receiving a blood transfusion, you will usually feel much better and more energetic. This is especially true if your red blood cells have gotten low (anemic). The transfusion raises the level of the red blood cells which carry oxygen, and this usually causes an energy increase.  The nurse administering the transfusion will monitor you carefully for complications. HOME CARE INSTRUCTIONS  No special instructions are needed after a transfusion. You may find your energy is better. Speak with your caregiver about any limitations on activity for underlying diseases you may have. SEEK MEDICAL CARE IF:   Your condition is not improving after your transfusion.  You develop redness or irritation at the intravenous (IV) site. SEEK IMMEDIATE MEDICAL CARE IF:  Any of the following symptoms occur over the next 12 hours:  Shaking chills.  You have a temperature by mouth above 102 F (38.9 C), not controlled by medicine.  Chest, back, or muscle pain.  People around you feel you are not acting correctly or are confused.  Shortness of breath or difficulty breathing.  Dizziness and fainting.  You get a rash or develop hives.  You have a decrease in urine output.  Your urine turns a dark color or changes to pink, red, or brown. Any of the following symptoms occur over the next 10 days:  You have a temperature by  mouth above 102 F (38.9 C), not controlled by medicine.  Shortness of breath.  Weakness after normal activity.  The white part of the eye turns yellow (jaundice).  You have a decrease in the  amount of urine or are urinating less often.  Your urine turns a dark color or changes to pink, red, or brown. Document Released: 11/01/2000 Document Revised: 01/27/2012 Document Reviewed: 06/20/2008 [facility] Patient Information 2014 [place], [place].  _______________________________________________________________________

## 2020-09-14 NOTE — Progress Notes (Addendum)
COVID Vaccine Completed:  x2 Date COVID Vaccine completed:  01-27-20 & 02-17-20 COVID vaccine manufacturer: [employer]   PCP - Billey Chang, MD Cardiologist -   +Covid 08-28-20.  Results in Epic  Chest x-ray -  EKG - 09-15-20 in Epic Stress Test -  ECHO -  Cardiac Cath -  Pacemaker/ICD device last checked:  Sleep Study - 12-01-18 in Epic CPAP -  Yes  Fasting Blood Sugar -  Checks Blood Sugar _____ times a day  Blood Thinner Instructions: Aspirin Instructions: Last Dose:  Anesthesia review:   Patient denies shortness of breath, fever, cough and chest pain at PAT appointment   Patient verbalized understanding of instructions that were given to them at the PAT appointment. Patient was also instructed that they will need to review over the PAT instructions again at home before surgery.

## 2020-09-15 ENCOUNTER — Encounter (HOSPITAL_COMMUNITY): Payer: Self-pay

## 2020-09-15 ENCOUNTER — Encounter (HOSPITAL_COMMUNITY)
Admission: RE | Admit: 2020-09-15 | Discharge: 2020-09-15 | Disposition: A | Discharge status: Home / Self Care | Payer: Medicare PPO | Source: Ambulatory Visit | Attending: Urology | Admitting: Urology

## 2020-09-15 ENCOUNTER — Other Ambulatory Visit: Payer: Self-pay

## 2020-09-15 DIAGNOSIS — I10 Essential (primary) hypertension: Secondary | ICD-10-CM | POA: Diagnosis not present

## 2020-09-15 DIAGNOSIS — Z01818 Encounter for other preprocedural examination: Secondary | ICD-10-CM | POA: Diagnosis not present

## 2020-09-15 HISTORY — DX: Other specified disorders of kidney and ureter: N28.89

## 2020-09-15 LAB — COMPREHENSIVE METABOLIC PANEL
ALT: 18 U/L (ref 0–44)
AST: 24 U/L (ref 15–41)
Albumin: 3.8 g/dL (ref 3.5–5.0)
Alkaline Phosphatase: 82 U/L (ref 38–126)
Anion gap: 13 (ref 5–15)
BUN: 17 mg/dL (ref 8–23)
CO2: 25 mmol/L (ref 22–32)
Calcium: 9.6 mg/dL (ref 8.9–10.3)
Chloride: 105 mmol/L (ref 98–111)
Creatinine, Ser: 0.99 mg/dL (ref 0.44–1.00)
GFR, Estimated: >60 mL/min (ref >60)
Glucose, Bld: 106 mg/dL — ABNORMAL HIGH (ref 70–99)
Potassium: 3.7 mmol/L (ref 3.5–5.1)
Sodium: 143 mmol/L (ref 135–145)
Total Bilirubin: 0.9 mg/dL (ref 0.3–1.2)
Total Protein: 6.9 g/dL (ref 6.5–8.1)

## 2020-09-15 LAB — CBC
HCT: 40.6 % (ref 36.0–46.0)
Hemoglobin: 13.6 g/dL (ref 12.0–15.0)
MCH: 30.8 pg (ref 26.0–34.0)
MCHC: 33.5 g/dL (ref 30.0–36.0)
MCV: 91.9 fL (ref 80.0–100.0)
Platelets: 327 10*3/uL (ref 150–400)
RBC: 4.42 MIL/uL (ref 3.87–5.11)
RDW: 13.5 % (ref 11.5–15.5)
WBC: 7.7 10*3/uL (ref 4.0–10.5)
nRBC: 0 % (ref 0.0–0.2)

## 2020-09-18 DIAGNOSIS — N2889 Other specified disorders of kidney and ureter: Secondary | ICD-10-CM

## 2020-09-18 HISTORY — DX: Other specified disorders of kidney and ureter: N28.89

## 2020-09-19 DIAGNOSIS — K8020 Calculus of gallbladder without cholecystitis without obstruction: Secondary | ICD-10-CM | POA: Diagnosis not present

## 2020-09-19 DIAGNOSIS — K439 Ventral hernia without obstruction or gangrene: Secondary | ICD-10-CM | POA: Diagnosis not present

## 2020-09-19 DIAGNOSIS — D49512 Neoplasm of unspecified behavior of left kidney: Secondary | ICD-10-CM | POA: Diagnosis not present

## 2020-09-19 DIAGNOSIS — N390 Urinary tract infection, site not specified: Secondary | ICD-10-CM | POA: Diagnosis not present

## 2020-09-19 DIAGNOSIS — Z9884 Bariatric surgery status: Secondary | ICD-10-CM | POA: Diagnosis not present

## 2020-09-22 ENCOUNTER — Ambulatory Visit (HOSPITAL_COMMUNITY): Payer: Medicare PPO | Admitting: Certified Registered Nurse Anesthetist

## 2020-09-22 ENCOUNTER — Encounter (HOSPITAL_COMMUNITY): Payer: Self-pay | Admitting: Urology

## 2020-09-22 ENCOUNTER — Other Ambulatory Visit: Payer: Self-pay

## 2020-09-22 ENCOUNTER — Observation Stay (HOSPITAL_COMMUNITY)
Admission: RE | Admit: 2020-09-22 | Discharge: 2020-09-23 | Disposition: A | Discharge status: Home / Self Care | Payer: Medicare PPO | Attending: Urology | Admitting: Urology

## 2020-09-22 ENCOUNTER — Encounter (HOSPITAL_COMMUNITY)
Admission: RE | Disposition: A | Discharge status: Home / Self Care | Payer: Self-pay | Source: Home / Self Care | Attending: Urology

## 2020-09-22 DIAGNOSIS — R1900 Intra-abdominal and pelvic swelling, mass and lump, unspecified site: Secondary | ICD-10-CM | POA: Diagnosis not present

## 2020-09-22 DIAGNOSIS — N2889 Other specified disorders of kidney and ureter: Secondary | ICD-10-CM | POA: Diagnosis not present

## 2020-09-22 DIAGNOSIS — Z23 Encounter for immunization: Secondary | ICD-10-CM | POA: Diagnosis not present

## 2020-09-22 DIAGNOSIS — E039 Hypothyroidism, unspecified: Secondary | ICD-10-CM | POA: Diagnosis not present

## 2020-09-22 DIAGNOSIS — I10 Essential (primary) hypertension: Secondary | ICD-10-CM | POA: Diagnosis not present

## 2020-09-22 DIAGNOSIS — G4733 Obstructive sleep apnea (adult) (pediatric): Secondary | ICD-10-CM | POA: Diagnosis not present

## 2020-09-22 DIAGNOSIS — C642 Malignant neoplasm of left kidney, except renal pelvis: Secondary | ICD-10-CM | POA: Diagnosis not present

## 2020-09-22 DIAGNOSIS — E782 Mixed hyperlipidemia: Secondary | ICD-10-CM | POA: Diagnosis not present

## 2020-09-22 DIAGNOSIS — D214 Benign neoplasm of connective and other soft tissue of abdomen: Secondary | ICD-10-CM | POA: Diagnosis not present

## 2020-09-22 DIAGNOSIS — D49512 Neoplasm of unspecified behavior of left kidney: Secondary | ICD-10-CM | POA: Diagnosis not present

## 2020-09-22 DIAGNOSIS — L729 Follicular cyst of the skin and subcutaneous tissue, unspecified: Secondary | ICD-10-CM | POA: Diagnosis not present

## 2020-09-22 HISTORY — PX: ROBOTIC ASSITED PARTIAL NEPHRECTOMY: SHX6087

## 2020-09-22 LAB — TYPE AND SCREEN
ABO/RH(D): O POS
Antibody Screen: NEGATIVE

## 2020-09-22 LAB — HEMOGLOBIN AND HEMATOCRIT, BLOOD
HCT: 44.3 % (ref 36.0–46.0)
Hemoglobin: 14.2 g/dL (ref 12.0–15.0)

## 2020-09-22 SURGERY — NEPHRECTOMY, PARTIAL, ROBOT-ASSISTED
Anesthesia: General | Site: Abdomen | Laterality: Left

## 2020-09-22 MED ORDER — LACTATED RINGERS IV SOLN: INTRAVENOUS | Status: DC | PRN

## 2020-09-22 MED ORDER — SUGAMMADEX SODIUM 500 MG/5ML IV SOLN
INTRAVENOUS | Status: AC
  Filled 2020-09-22: qty 5

## 2020-09-22 MED ORDER — DEXAMETHASONE SODIUM PHOSPHATE 10 MG/ML IJ SOLN
INTRAMUSCULAR | Status: AC
  Filled 2020-09-22: qty 1

## 2020-09-22 MED ORDER — ORAL CARE MOUTH RINSE: 15.0000 mL | Freq: Once | OROMUCOSAL | Status: AC

## 2020-09-22 MED ORDER — BUPIVACAINE LIPOSOME 1.3 % IJ SUSP
20.0000 mL | Freq: Once | INTRAMUSCULAR | Status: AC
  Administered 2020-09-22: 20 mL
  Filled 2020-09-22: qty 20

## 2020-09-22 MED ORDER — CEFAZOLIN SODIUM-DEXTROSE 1-4 GM/50ML-% IV SOLN
1.0000 g | Freq: Three times a day (TID) | INTRAVENOUS | Status: DC
  Filled 2020-09-22: qty 50

## 2020-09-22 MED ORDER — ROCURONIUM BROMIDE 10 MG/ML (PF) SYRINGE
PREFILLED_SYRINGE | INTRAVENOUS | Status: DC | PRN
  Administered 2020-09-22 (×2): 10 mg via INTRAVENOUS
  Administered 2020-09-22: 60 mg via INTRAVENOUS
  Administered 2020-09-22: 10 mg via INTRAVENOUS

## 2020-09-22 MED ORDER — EPHEDRINE 5 MG/ML INJ
INTRAVENOUS | Status: AC
  Filled 2020-09-22: qty 10

## 2020-09-22 MED ORDER — CHLORHEXIDINE GLUCONATE 0.12 % MT SOLN
15.0000 mL | Freq: Once | OROMUCOSAL | Status: AC
  Administered 2020-09-22: 15 mL via OROMUCOSAL

## 2020-09-22 MED ORDER — PROMETHAZINE HCL 12.5 MG PO TABS: 12.5000 mg | ORAL_TABLET | ORAL | 0 refills | Status: DC | PRN

## 2020-09-22 MED ORDER — PROPOFOL 10 MG/ML IV BOLUS
INTRAVENOUS | Status: AC
  Filled 2020-09-22: qty 20

## 2020-09-22 MED ORDER — "VISTASEAL 4 ML SINGLE DOSE KIT "
4.0000 mL | PACK | Freq: Once | CUTANEOUS | Status: DC
  Filled 2020-09-22: qty 4

## 2020-09-22 MED ORDER — STERILE WATER FOR IRRIGATION IR SOLN
Status: DC | PRN
  Administered 2020-09-22: 1000 mL

## 2020-09-22 MED ORDER — PROPOFOL 10 MG/ML IV BOLUS
INTRAVENOUS | Status: DC | PRN
  Administered 2020-09-22: 150 mg via INTRAVENOUS

## 2020-09-22 MED ORDER — FENTANYL CITRATE (PF) 250 MCG/5ML IJ SOLN
INTRAMUSCULAR | Status: AC
  Filled 2020-09-22: qty 5

## 2020-09-22 MED ORDER — FENTANYL CITRATE (PF) 100 MCG/2ML IJ SOLN
INTRAMUSCULAR | Status: DC | PRN
  Administered 2020-09-22: 50 ug via INTRAVENOUS
  Administered 2020-09-22: 100 ug via INTRAVENOUS
  Administered 2020-09-22 (×4): 50 ug via INTRAVENOUS

## 2020-09-22 MED ORDER — DEXAMETHASONE SODIUM PHOSPHATE 10 MG/ML IJ SOLN
INTRAMUSCULAR | Status: DC | PRN
  Administered 2020-09-22: 8 mg via INTRAVENOUS

## 2020-09-22 MED ORDER — ACETAMINOPHEN 325 MG PO TABS: 650.0000 mg | ORAL_TABLET | ORAL | Status: DC | PRN

## 2020-09-22 MED ORDER — PHENYLEPHRINE HCL-NACL 10-0.9 MG/250ML-% IV SOLN
INTRAVENOUS | Status: DC | PRN
  Administered 2020-09-22: 40 ug/min via INTRAVENOUS

## 2020-09-22 MED ORDER — ACETAMINOPHEN 500 MG PO TABS
1000.0000 mg | ORAL_TABLET | Freq: Once | ORAL | Status: AC
  Administered 2020-09-22: 1000 mg via ORAL
  Filled 2020-09-22: qty 2

## 2020-09-22 MED ORDER — ONDANSETRON HCL 4 MG/2ML IJ SOLN: 4.0000 mg | INTRAMUSCULAR | Status: DC | PRN

## 2020-09-22 MED ORDER — SODIUM CHLORIDE (PF) 0.9 % IJ SOLN
INTRAMUSCULAR | Status: AC
  Filled 2020-09-22: qty 20

## 2020-09-22 MED ORDER — CEFAZOLIN SODIUM-DEXTROSE 2-4 GM/100ML-% IV SOLN
2.0000 g | Freq: Once | INTRAVENOUS | Status: AC
  Administered 2020-09-22: 2 g via INTRAVENOUS
  Filled 2020-09-22: qty 100

## 2020-09-22 MED ORDER — DIPHENHYDRAMINE HCL 12.5 MG/5ML PO ELIX: 12.5000 mg | ORAL_SOLUTION | Freq: Four times a day (QID) | ORAL | Status: DC | PRN

## 2020-09-22 MED ORDER — BELLADONNA ALKALOIDS-OPIUM 16.2-60 MG RE SUPP: 1.0000 | Freq: Four times a day (QID) | RECTAL | Status: DC | PRN

## 2020-09-22 MED ORDER — SUGAMMADEX SODIUM 500 MG/5ML IV SOLN
INTRAVENOUS | Status: DC | PRN
  Administered 2020-09-22: 223.6 mg via INTRAVENOUS

## 2020-09-22 MED ORDER — FENTANYL CITRATE (PF) 100 MCG/2ML IJ SOLN
25.0000 ug | INTRAMUSCULAR | Status: DC | PRN
  Administered 2020-09-22 (×2): 50 ug via INTRAVENOUS

## 2020-09-22 MED ORDER — LIDOCAINE 2% (20 MG/ML) 5 ML SYRINGE
INTRAMUSCULAR | Status: DC | PRN
  Administered 2020-09-22: 80 mg via INTRAVENOUS

## 2020-09-22 MED ORDER — CEFAZOLIN SODIUM-DEXTROSE 1-4 GM/50ML-% IV SOLN
1.0000 g | Freq: Three times a day (TID) | INTRAVENOUS | Status: AC
  Administered 2020-09-22 – 2020-09-23 (×2): 1 g via INTRAVENOUS
  Filled 2020-09-22 (×2): qty 50

## 2020-09-22 MED ORDER — LACTATED RINGERS IR SOLN
Status: DC | PRN
  Administered 2020-09-22: 1000 mL

## 2020-09-22 MED ORDER — DILTIAZEM HCL ER COATED BEADS 240 MG PO CP24
240.0000 mg | ORAL_CAPSULE | Freq: Every day | ORAL | Status: DC
  Administered 2020-09-23: 240 mg via ORAL
  Filled 2020-09-22: qty 1

## 2020-09-22 MED ORDER — DIPHENHYDRAMINE HCL 50 MG/ML IJ SOLN: 12.5000 mg | Freq: Four times a day (QID) | INTRAMUSCULAR | Status: DC | PRN

## 2020-09-22 MED ORDER — OXYCODONE-ACETAMINOPHEN 5-325 MG PO TABS
1.0000 | ORAL_TABLET | ORAL | Status: DC | PRN
  Administered 2020-09-23: 2 via ORAL
  Administered 2020-09-23: 1 via ORAL
  Filled 2020-09-22 (×2): qty 2

## 2020-09-22 MED ORDER — ONDANSETRON HCL 4 MG/2ML IJ SOLN
INTRAMUSCULAR | Status: DC | PRN
  Administered 2020-09-22: 4 mg via INTRAVENOUS

## 2020-09-22 MED ORDER — DOCUSATE SODIUM 100 MG PO CAPS
100.0000 mg | ORAL_CAPSULE | Freq: Two times a day (BID) | ORAL | Status: DC
  Administered 2020-09-22 – 2020-09-23 (×2): 100 mg via ORAL
  Filled 2020-09-22 (×2): qty 1

## 2020-09-22 MED ORDER — PHENYLEPHRINE HCL (PRESSORS) 10 MG/ML IV SOLN
INTRAVENOUS | Status: AC
  Filled 2020-09-22: qty 1

## 2020-09-22 MED ORDER — LACTATED RINGERS IV SOLN: INTRAVENOUS | Status: DC

## 2020-09-22 MED ORDER — FENTANYL CITRATE (PF) 100 MCG/2ML IJ SOLN
INTRAMUSCULAR | Status: AC
  Filled 2020-09-22: qty 2

## 2020-09-22 MED ORDER — DEXTROSE-NACL 5-0.45 % IV SOLN: INTRAVENOUS | Status: DC

## 2020-09-22 MED ORDER — HYDROMORPHONE HCL 1 MG/ML IJ SOLN
0.5000 mg | INTRAMUSCULAR | Status: DC | PRN
  Administered 2020-09-22: 0.5 mg via INTRAVENOUS
  Administered 2020-09-22 (×2): 1 mg via INTRAVENOUS
  Filled 2020-09-22 (×3): qty 1

## 2020-09-22 MED ORDER — ROSUVASTATIN CALCIUM 10 MG PO TABS
10.0000 mg | ORAL_TABLET | Freq: Every day | ORAL | Status: DC
  Administered 2020-09-22: 10 mg via ORAL
  Filled 2020-09-22: qty 1

## 2020-09-22 MED ORDER — FLUOXETINE HCL 20 MG PO CAPS
20.0000 mg | ORAL_CAPSULE | Freq: Every day | ORAL | Status: DC
  Administered 2020-09-23: 20 mg via ORAL
  Filled 2020-09-22: qty 1

## 2020-09-22 MED ORDER — PROMETHAZINE HCL 25 MG/ML IJ SOLN: 6.2500 mg | INTRAMUSCULAR | Status: DC | PRN

## 2020-09-22 MED ORDER — DOCUSATE SODIUM 100 MG PO CAPS: 100.0000 mg | ORAL_CAPSULE | Freq: Two times a day (BID) | ORAL | Status: DC

## 2020-09-22 MED ORDER — MIDAZOLAM HCL 2 MG/2ML IJ SOLN
INTRAMUSCULAR | Status: AC
  Filled 2020-09-22: qty 2

## 2020-09-22 MED ORDER — SODIUM CHLORIDE (PF) 0.9 % IJ SOLN
INTRAMUSCULAR | Status: DC | PRN
  Administered 2020-09-22: 20 mL

## 2020-09-22 MED ORDER — TRAMADOL HCL 50 MG PO TABS
50.0000 mg | ORAL_TABLET | Freq: Four times a day (QID) | ORAL | 0 refills | Status: DC | PRN
Start: 2020-09-22 — End: 2021-02-11

## 2020-09-22 MED ORDER — MIDAZOLAM HCL 5 MG/5ML IJ SOLN
INTRAMUSCULAR | Status: DC | PRN
  Administered 2020-09-22: 2 mg via INTRAVENOUS

## 2020-09-22 MED ORDER — ROPINIROLE HCL ER 4 MG PO TB24
4.0000 mg | ORAL_TABLET | Freq: Every day | ORAL | Status: DC
  Administered 2020-09-22: 4 mg via ORAL
  Filled 2020-09-22: qty 1

## 2020-09-22 MED ORDER — LEVOTHYROXINE SODIUM 75 MCG PO TABS
175.0000 ug | ORAL_TABLET | Freq: Every day | ORAL | Status: DC
  Administered 2020-09-23: 175 ug via ORAL
  Filled 2020-09-22: qty 1

## 2020-09-22 SURGICAL SUPPLY — 77 items
ADH SKN CLS APL DERMABOND .7 (GAUZE/BANDAGES/DRESSINGS) ×2
APL LAPSCP 35 DL APL RGD (MISCELLANEOUS) ×1
APL PRP STRL LF DISP 70% ISPRP (MISCELLANEOUS) ×1
APL SRG 32X5 SNPLK LF DISP (MISCELLANEOUS)
APPLICATOR VISTASEAL 35 (MISCELLANEOUS) ×3 IMPLANT
BAG LAPAROSCOPIC 12 15 PORT 16 (BASKET) IMPLANT
BAG RETRIEVAL 12/15 (BASKET) ×2
BAG RETRIEVAL 12/15MM (BASKET) ×1
BAG SPEC RTRVL LRG 6X4 10 (ENDOMECHANICALS) ×1
CHLORAPREP W/TINT 26 (MISCELLANEOUS) ×3 IMPLANT
CLIP SUT LAPRA TY ABSORB (SUTURE) ×3 IMPLANT
CLIP VESOLOCK LG 6/CT PURPLE (CLIP) ×5 IMPLANT
CLIP VESOLOCK MED LG 6/CT (CLIP) ×5 IMPLANT
CLIP VESOLOCK XL 6/CT (CLIP) IMPLANT
COVER SURGICAL LIGHT HANDLE (MISCELLANEOUS) ×3 IMPLANT
COVER TIP SHEARS 8 DVNC (MISCELLANEOUS) ×1 IMPLANT
COVER TIP SHEARS 8MM DA VINCI (MISCELLANEOUS) ×3
COVER WAND RF STERILE (DRAPES) IMPLANT
CUTTER ECHEON FLEX ENDO 45 340 (ENDOMECHANICALS) ×2 IMPLANT
DECANTER SPIKE VIAL GLASS SM (MISCELLANEOUS) ×3 IMPLANT
DERMABOND ADVANCED (GAUZE/BANDAGES/DRESSINGS) ×4
DERMABOND ADVANCED .7 DNX12 (GAUZE/BANDAGES/DRESSINGS) ×1 IMPLANT
DRAIN CHANNEL 15F RND FF 3/16 (WOUND CARE) IMPLANT
DRAPE ARM DVNC X/XI (DISPOSABLE) ×4 IMPLANT
DRAPE COLUMN DVNC XI (DISPOSABLE) ×1 IMPLANT
DRAPE DA VINCI XI ARM (DISPOSABLE) ×12
DRAPE DA VINCI XI COLUMN (DISPOSABLE) ×3
DRAPE INCISE IOBAN 66X45 STRL (DRAPES) ×3 IMPLANT
DRAPE SHEET LG 3/4 BI-LAMINATE (DRAPES) ×3 IMPLANT
ELECT REM PT RETURN 15FT ADLT (MISCELLANEOUS) ×3 IMPLANT
EVACUATOR SILICONE 100CC (DRAIN) IMPLANT
GLOVE BIO SURGEON STRL SZ 6.5 (GLOVE) ×2 IMPLANT
GLOVE BIO SURGEONS STRL SZ 6.5 (GLOVE) ×1
GLOVE BIOGEL M STRL SZ7.5 (GLOVE) ×6 IMPLANT
GLOVE BIOGEL PI IND STRL 8 (GLOVE) ×2 IMPLANT
GLOVE BIOGEL PI INDICATOR 8 (GLOVE) ×4
GOWN STRL REUS W/TWL LRG LVL3 (GOWN DISPOSABLE) ×3 IMPLANT
GOWN STRL REUS W/TWL XL LVL3 (GOWN DISPOSABLE) ×6 IMPLANT
HEMOSTAT SURGICEL 4X8 (HEMOSTASIS) ×3 IMPLANT
IRRIG SUCT STRYKERFLOW 2 WTIP (MISCELLANEOUS) ×3
IRRIGATION SUCT STRKRFLW 2 WTP (MISCELLANEOUS) ×1 IMPLANT
KIT BASIN OR (CUSTOM PROCEDURE TRAY) ×3 IMPLANT
KIT TURNOVER KIT A (KITS) IMPLANT
MARKER SKIN DUAL TIP RULER LAB (MISCELLANEOUS) ×3 IMPLANT
NDL INSUFFLATION 14GA 120MM (NEEDLE) ×1 IMPLANT
NEEDLE INSUFFLATION 14GA 120MM (NEEDLE) ×3 IMPLANT
PENCIL SMOKE EVACUATOR (MISCELLANEOUS) IMPLANT
POUCH SPECIMEN RETRIEVAL 10MM (ENDOMECHANICALS) ×3 IMPLANT
PROTECTOR NERVE ULNAR (MISCELLANEOUS) ×6 IMPLANT
RELOAD STAPLE 45 2.6 WHT THIN (STAPLE) IMPLANT
SEAL CANN UNIV 5-8 DVNC XI (MISCELLANEOUS) ×3 IMPLANT
SEAL XI 5MM-8MM UNIVERSAL (MISCELLANEOUS) ×12
SEALANT SURGICAL APPL DUAL CAN (MISCELLANEOUS) IMPLANT
SET TUBE SMOKE EVAC HIGH FLOW (TUBING) ×3 IMPLANT
SOLUTION ELECTROLUBE (MISCELLANEOUS) ×3 IMPLANT
STAPLE RELOAD 45 WHT (STAPLE) ×2 IMPLANT
STAPLE RELOAD 45MM WHITE (STAPLE) ×6
SUT ETHILON 2 0 PSLX (SUTURE) IMPLANT
SUT MNCRL AB 4-0 PS2 18 (SUTURE) ×6 IMPLANT
SUT PDS AB 0 CT1 36 (SUTURE) IMPLANT
SUT V-LOC BARB 180 2/0GR6 GS22 (SUTURE)
SUT VIC AB 1 CT1 27 (SUTURE) ×15
SUT VIC AB 1 CT1 27XBRD ANTBC (SUTURE) ×4 IMPLANT
SUT VIC AB 3-0 SH 27 (SUTURE) ×3
SUT VIC AB 3-0 SH 27XBRD (SUTURE) IMPLANT
SUT VICRYL 0 UR6 27IN ABS (SUTURE) IMPLANT
SUT VLOC BARB 180 ABS3/0GR12 (SUTURE) ×6
SUTURE V-LC BRB 180 2/0GR6GS22 (SUTURE) IMPLANT
SUTURE VLOC BRB 180 ABS3/0GR12 (SUTURE) ×2 IMPLANT
TOWEL OR 17X26 10 PK STRL BLUE (TOWEL DISPOSABLE) ×3 IMPLANT
TOWEL OR NON WOVEN STRL DISP B (DISPOSABLE) ×3 IMPLANT
TRAY FOLEY MTR SLVR 16FR STAT (SET/KITS/TRAYS/PACK) ×3 IMPLANT
TRAY LAPAROSCOPIC (CUSTOM PROCEDURE TRAY) ×3 IMPLANT
TROCAR BLADELESS OPT 5 100 (ENDOMECHANICALS) IMPLANT
TROCAR UNIVERSAL OPT 12M 100M (ENDOMECHANICALS) IMPLANT
TROCAR XCEL 12X100 BLDLESS (ENDOMECHANICALS) ×3 IMPLANT
WATER STERILE IRR 1000ML POUR (IV SOLUTION) ×3 IMPLANT

## 2020-09-22 NOTE — Progress Notes (Signed)
Patient ambulated on hall way at this time via walker, tolerated well.

## 2020-09-22 NOTE — Anesthesia Preprocedure Evaluation (Addendum)
Anesthesia Evaluation  Patient identified by MRN, date of birth, ID band Patient awake    Reviewed: Allergy & Precautions, NPO status , Patient's Chart, lab work & pertinent test results  History of Anesthesia Complications Negative for: history of anesthetic complications  Airway Mallampati: IV  TM Distance: >3 FB Neck ROM: Full    Dental  (+) Teeth Intact,    Pulmonary sleep apnea and Continuous Positive Airway Pressure Ventilation ,    Pulmonary exam normal breath sounds clear to auscultation       Cardiovascular hypertension, Pt. on medications Normal cardiovascular exam Rhythm:Regular Rate:Normal     Neuro/Psych PSYCHIATRIC DISORDERS Depression negative neurological ROS     GI/Hepatic negative GI ROS, Neg liver ROS,   Endo/Other  Hypothyroidism Morbid obesity  Renal/GU LEFT RENAL MASS     Musculoskeletal  (+) Arthritis ,   Abdominal   Peds  Hematology negative hematology ROS (+)   Anesthesia Other Findings Day of surgery medications reviewed with the patient.  Reproductive/Obstetrics                            Anesthesia Physical Anesthesia Plan  ASA: II  Anesthesia Plan: General   Post-op Pain Management:    Induction: Intravenous  PONV Risk Score and Plan: 3 and Midazolam, Dexamethasone and Ondansetron  Airway Management Planned: Oral ETT and Video Laryngoscope Planned  Additional Equipment:   Intra-op Plan:   Post-operative Plan: Extubation in OR  Informed Consent: I have reviewed the patients History and Physical, chart, labs and discussed the procedure including the risks, benefits and alternatives for the proposed anesthesia with the patient or authorized representative who has indicated his/her understanding and acceptance.       Plan Discussed with: CRNA  Anesthesia Plan Comments: (2nd PIV)       Anesthesia Quick Evaluation

## 2020-09-22 NOTE — Anesthesia Postprocedure Evaluation (Signed)
Anesthesia Post Note  Patient: CHARLAINE UTSEY  Procedure(s) Performed: XI ROBOTIC ASSITED LAPAROSCOPIC  TOTAL NEPHRECTOMY; RESECTION OF ABDOMINAL MASS (Left Abdomen)     Patient location during evaluation: PACU Anesthesia Type: General Level of consciousness: awake and alert, awake and oriented Pain management: pain level controlled Vital Signs Assessment: post-procedure vital signs reviewed and stable Respiratory status: spontaneous breathing, nonlabored ventilation and respiratory function stable Cardiovascular status: blood pressure returned to baseline and stable Postop Assessment: no apparent nausea or vomiting Anesthetic complications: no   No complications documented.  Last Vitals:  Vitals:   09/22/20 1700 09/22/20 1715  BP: 127/70 123/68  Pulse: 76 73  Resp: 14 10  Temp:    SpO2: 98% 99%    Last Pain:  Vitals:   09/22/20 1645  TempSrc:   PainSc: 4                  Catalina Gravel

## 2020-09-22 NOTE — Op Note (Signed)
Operative Note  Preoperative diagnosis:  1.  4.2 cm left renal mass  Postoperative diagnosis: 1.  4.2 cm left renal mass  Procedure(s): 1.  Robot-assisted laparoscopic left radical nephrectomy (adrenal sparing) 2.  Excision of cystic abdominal wall lesion 3.  Intra-operative ultrasound of single retroperitoneal organ  Surgeon: Ellison Hughs, MD  Assistants:   1.  Debbrah Alar, [facility]  An assistant was required for this surgical procedure.  The duties of the assistant included but were not limited to suctioning, passing suture, camera manipulation, retraction.  This procedure would not be able to be performed without an Environmental consultant.  2.  Ermelinda Das, MD PGY4  Anesthesia:  General  Complications:  None  EBL:  100 mL  Specimens: 1.  Left kidney 2.  Cystic abdominal wall lesion  Drains/Catheters: 1.  Foley catheter  Intraoperative findings:   1. Lap band tubing was seen extending from the epigastric region medial to the left kidney and up to the right upper quadrant reservoir.  2. Intra-operative ultrasound of the left renal mass revealed that it was extensively abutting the upper pole collecting system, making a partial nephrectomy high risk for a urine leak and secondary infection of her lap band apparatus.    Indication:  Veronica Finley is a 69 y.o. female with a solide and enhancing 4.2 cm left renal mass with features concerning for renal cell carcinoma.  The patient was consented for the above procedures, voices understanding and wishes to proceed.  Description of procedure:  After informed consent was obtained, the patient was brought to the operating room and general endotracheal anesthesia was administered.  The patient was then placed in the right lateral decubitus position and prepped and draped in usual sterile fashion.  A timeout was performed.  An 8 mm incision was then made lateral to the left rectus muscle at the level of the left 12th rib.  Abdominal access  was obtained via a Veress needle.  The abdominal cavity was then insufflated up to 15 mmHg.  An 8 mm port was then introduced into the abdominal cavity.  I immediately identified her previously placed lap band tubing extending from the epigastric region  medial to the left kidney and up to the right upper quadrant reservoir.  Inspection of the port entry site by the robotic camera revealed no adjacent organ injury.  We then placed 3 additional 8 mm robotic ports to triangulate the left renal hilum.  A 12 mm assistant port was then placed between the carmera port and 3rd robotic arm.  She was noted to have a cystic lesion extending from the anterior peritoneum in the left lower quadrant.  The white line of Toldt along the descending colon was incised sharply and the colon, along with its mesocolonic fat, was reflected medially until the aorta was identified.  We then made a small window adjacent to the lower pole of the left kidney, identifying the left psoas muscle, left ureter and left gonadal vein.  The left ureter and gonadal vein were then reflected anteriorly allowing [place] to then incised the perihilar attachments using electrocautery.  We encountered a small lumbar vein adjacent to the insertion of the left gonadal vein into the left renal vein.  This lumbar vein was ligated with hemo-lock clips in 2 places and incised sharply.  This provided [place] excellent exposure to the left renal hilum.    The left kidney was the completely mobilized and reflected medially, exposing her posterior, upper pole renal mass.  Intra-operative renal ultrasound was performed evealed that it was extensively abutting the upper pole collecting system, making a partial nephrectomy high risk for a urine leak and secondary infection of her lap band apparatus.  At that point, I made the decision to proceed with a radical nephrectomy.     Hemolock clips were then used to ligate the left renal artery, achieving excellent hemostasis.  A  45 mm endovascular stapler was then used to ligate the then the left renal vein, achieving excellent hemostasis.  The remaining peri-renal attachments were then excised using a combination of blunt dissection and electrocautery.  The left adrenal gland was spared.  The endovascular stapler was then used to ligate the left gonadal vein and left ureter.  Once the kidney was freely mobile, it was placed in Endo Catch bag to be be retrieved at the conclusion of the case.  Due to the proximity of the cystic abdominal wall lesion to her planned extraction incision and concern for incorporating part of the lesion in the closure, the aforementioned lesion was excised and sent to pathology for permanent section.   The robot was then de-docked.  A left lower quadrant Gibson incision was then made and the mass was removed within the Endo Catch bag.  The fascia within the midline assistant port was then closed using an interrupted 0 Vicryl suture.  The fascia of the internal and external oblique was then closed using a 0 PDS suture in a running fashion.  The subcutaneous tissue within the [facility] incision was then closed using a running 0 Vicryl suture.  All skin incisions were then closed using 4-0 Monocryl and then dressed with Dermabond.  The patient tolerated the procedure well and was transferred to the postanesthesia in stable condition.    Plan:  Monitor on the floor overnight.  Continue IV antibiotic coverage.

## 2020-09-22 NOTE — Transfer of Care (Signed)
Immediate Anesthesia Transfer of Care Note  Patient: Veronica Finley  Procedure(s) Performed: XI ROBOTIC ASSITED LAPAROSCOPIC  TOTAL NEPHRECTOMY; RESECTION OF ABDOMINAL MASS (Left Abdomen)  Patient Location: PACU  Anesthesia Type:General  Level of Consciousness: drowsy and patient cooperative  Airway & Oxygen Therapy: Patient Spontanous Breathing and Patient connected to face mask oxygen  Post-op Assessment: Report given to RN and Post -op Vital signs reviewed and stable  Post vital signs: Reviewed and stable  Last Vitals:  Vitals Value Taken Time  BP 107/60 09/22/20 1622  Temp    Pulse 83 09/22/20 1625  Resp 13 09/22/20 1625  SpO2 99 % 09/22/20 1625  Vitals shown include unvalidated device data.  Last Pain:  Vitals:   09/22/20 0900  TempSrc: Oral         Complications: No complications documented.

## 2020-09-22 NOTE — Discharge Instructions (Signed)

## 2020-09-22 NOTE — Anesthesia Procedure Notes (Signed)
Procedure Name: Intubation Date/Time: 09/22/2020 12:43 PM Performed by: Montel Clock, CRNA Pre-anesthesia Checklist: Patient identified, Emergency Drugs available, Suction available, Patient being monitored and Timeout performed Patient Re-evaluated:Patient Re-evaluated prior to induction Oxygen Delivery Method: Circle system utilized Preoxygenation: Pre-oxygenation with 100% oxygen Induction Type: IV induction Ventilation: Mask ventilation without difficulty and Oral airway inserted - appropriate to patient size Laryngoscope Size: 3 and Glidescope Grade View: Grade I Tube type: Oral Tube size: 7.0 mm Number of attempts: 1 Airway Equipment and Method: Stylet Placement Confirmation: ETT inserted through vocal cords under direct vision,  positive ETCO2 and breath sounds checked- equal and bilateral Secured at: 21 cm Tube secured with: Tape Dental Injury: Teeth and Oropharynx as per pre-operative assessment  Comments: Elective glidescope.

## 2020-09-22 NOTE — H&P (Signed)
PRE-OP H&P  Office Visit Report     09/19/2020   --------------------------------------------------------------------------------   Veronica Finley  MRN: [id number]  DOB: 1951/09/26, 69 year old Female  SSN:    PRIMARY CARE:  [place]. Veronica Sanger, MD  REFERRING:  Veronica Finley. Veronica Neighbours, MD  PROVIDER:  Ellison Finley, M.D.  TREATING:  Veronica Fallen, NP  LOCATION:  [facility] [phone]     --------------------------------------------------------------------------------   CC/HPI: CC: Left renal mass   HPI: Veronica Finley is a 69 year old female who was found to have a 3.7 cm left renal mass incidentally identified on MRI lumbar spine and further characterized via renal US on 07/03/20.   PSHx: lap band, tubal ligation, lumbar fusion   Last serum creatinine- 0.9 (05/17/20)   -No identifiable lesion involving the right kidney  -No personal/family hx of GU malignancies  -Non-smoker  -She denies a prior history of flank pain or hematuria  -Retired Pharmacist, hospital    Interval 09/19/20: Patient with above-noted history. She is scheduled for robotic assisted left partial nephrectomy on 11/05. She presents today for preoperative assessment. She denies any interval episodes of flank or abdominal pain. No complaints of dysuria or gross hematuria. She currently feels she is voiding at baseline. She states that in the interim, she did have COVID-19. This was mild. She received transfusion for treatment. She was never hospitalized or diagnosed with pneumonia. She denied any shortness of breath or chest pain during this event. Otherwise, she denies any significant changes in her overall health. She denies addition of new medications. She is not currently on any blood thinners. She denies any recent episodes of fever, chills, nausea, or vomiting. She does have consult with General surgery later today for supraumbilical hernia noted on MRI. Full MRI impression is noted below.   IMPRESSION:  1.  3.8 by 3.5 by 4.2 cm solid enhancing mass of the left kidney  upper pole medially, compatible with renal cell carcinoma. No  appreciable tumor thrombus in the left renal vein.  2. Unusual appearance of a small localized fluid collection along  the left lower omentum, measuring up to 7.3 cm vertically, without  significant enhancement along the margins. Cause for this small  loculated partially visualized collection is uncertain.  3. Small hiatal hernia.  4. Questionable fundoplication wrap, diverticulum, or device along  the distal esophagus. There is also a lap band.  5. 2.0 cm gallstone in the gallbladder.  6. Lumbar spondylosis and degenerative disc disease.  7. Supraumbilical hernia containing adipose tissue and trace fluid.     ALLERGIES: Codeine - Dizziness    MEDICATIONS: Crestor 10 mg tablet  Cardizem Cd 240 mg capsule, ext release 24 hr  Fluoxetine Hcl 20 mg capsule  Gabapentin 300 mg capsule  Losartan-Hydrochlorothiazide 100 mg-25 mg tablet  Meloxicam 15 mg tablet  Methocarbamol 500 mg tablet  Prednisone 10 mg tablet  Ropinirole Er 4 mg tablet, extended release 24 hr  Synthroid 175 mcg tablet     GU PSH: None   NON-GU PSH: Knee replacement, Bilateral - 07/15/2019 Lap, Place Gastr Adjust Band - 2006 Neck Spinal Fusion - 2010     GU PMH: Left renal neoplasm - 07/14/2020    NON-GU PMH: Arthritis Cardiac murmur, unspecified COVID-19, virus identified Depression Hypertension Hypothyroidism Sleep Apnea    FAMILY HISTORY: None   SOCIAL HISTORY: Marital Status: Divorced Preferred Language: English Current Smoking Status: Patient has never smoked.   Tobacco Use Assessment Completed: Used Tobacco in last  30 days? Social Drinker.  Drinks 2 caffeinated drinks per day. Patient's occupation is/was Retired.    REVIEW OF SYSTEMS:    GU Review Female:   Patient denies frequent urination, hard to postpone urination, burning /pain with urination, get up at night to  urinate, leakage of urine, stream starts and stops, trouble starting your stream, have to strain to urinate, and being pregnant.  Gastrointestinal (Upper):   Patient denies nausea, vomiting, and indigestion/ heartburn.  Gastrointestinal (Lower):   Patient denies diarrhea and constipation.  Constitutional:   Patient denies fever, night sweats, weight loss, and fatigue.  Skin:   Patient denies skin rash/ lesion and itching.  Eyes:   Patient denies blurred vision and double vision.  Ears/ Nose/ Throat:   Patient denies sore throat and sinus problems.  Hematologic/Lymphatic:   Patient denies swollen glands and easy bruising.  Cardiovascular:   Patient denies leg swelling and chest pains.  Respiratory:   Patient denies cough and shortness of breath.  Endocrine:   Patient denies excessive thirst.  Musculoskeletal:   Patient denies joint pain and back pain.  Neurological:   Patient denies headaches and dizziness.  Psychologic:   Patient denies depression and anxiety.   VITAL SIGNS:      09/19/2020 08:49 AM  BP 125/80 mmHg  Heart Rate 101 /min  Temperature 97.0 F / 36.1 C   MULTI-SYSTEM PHYSICAL EXAMINATION:    Constitutional: Well-nourished. No physical deformities. Normally developed. Good grooming.  Respiratory: Normal breath sounds. No labored breathing, no use of accessory muscles.   Cardiovascular: Regular rate and rhythm. No murmur, no gallop. Normal temperature, normal extremity pulses, no swelling, no varicosities.   Skin: No paleness, no jaundice, no cyanosis. No lesion, no ulcer, no rash.  Neurologic / Psychiatric: Oriented to time, oriented to place, oriented to person. No depression, no anxiety, no agitation.  Gastrointestinal: Obese abdomen. No mass, no tenderness, no rigidity.   Musculoskeletal: Normal gait and station of head and neck.     Complexity of Data:  Lab Test Review:   BMP  Records Review:   Previous Patient Records  Urine Test Review:   Urinalysis, Urine Culture   X-Ray Review: MRI Abdomen: Reviewed Films. Reviewed Report.    Notes:                     Creatinine on 10/29 was noted to be 0.99. Hgb 13.6   PROCEDURES:          Urinalysis Dipstick Dipstick Cont'd  Color: Yellow Bilirubin: Neg mg/dL  Appearance: Clear Ketones: Neg mg/dL  Specific Gravity: 1.020 Blood: Neg ery/uL  pH: 5.5 Protein: Neg mg/dL  Glucose: Neg mg/dL Urobilinogen: 0.2 mg/dL    Nitrites: Neg    Leukocyte Esterase: Neg leu/uL    ASSESSMENT:      ICD-10 Details  1 GU:   Left renal neoplasm - D49.512    PLAN:           Orders Labs Urine Culture          Document Letter(s):  Created for Patient: Clinical Summary    PLAN:  69 year old female with a solid and enhancing left renal mass with features concerning for renal cell carcinoma.  -I personally reviewed imaging results and films with the patient. We discussed that the mass in question has features concerning for malignancy. I explained the natural history of presumed renal cell carcinoma. I reviewed the AUA guidelines for evaluation and treatment of the small  renal mass. The options of active surveillance, in situ tumor ablation, partial and radical nephrectomy was discussed. The risks of robotic LEFT partial nephrectomy were discussed in detail including but not limited to: negative pathology, open conversion, completion nephrectomy, infection of the urinary tract/skin/abdominal cavity, VTE, MI/CVA, lymphatic leak, injury to adjacent solid/hollow viscus organs, bleeding requiring a blood transfusion, catastrophic bleeding, hernia formation, need for postoperative angioembolization, urinary leak requiring stent/drain, and other imponderables.

## 2020-09-23 ENCOUNTER — Encounter (HOSPITAL_COMMUNITY): Payer: Self-pay | Admitting: Urology

## 2020-09-23 DIAGNOSIS — C642 Malignant neoplasm of left kidney, except renal pelvis: Secondary | ICD-10-CM | POA: Diagnosis not present

## 2020-09-23 LAB — BASIC METABOLIC PANEL
Anion gap: 11 (ref 5–15)
BUN: 15 mg/dL (ref 8–23)
CO2: 26 mmol/L (ref 22–32)
Calcium: 9.1 mg/dL (ref 8.9–10.3)
Chloride: 100 mmol/L (ref 98–111)
Creatinine, Ser: 1.5 mg/dL — ABNORMAL HIGH (ref 0.44–1.00)
GFR, Estimated: 38 mL/min — ABNORMAL LOW (ref >60)
Glucose, Bld: 149 mg/dL — ABNORMAL HIGH (ref 70–99)
Potassium: 4.5 mmol/L (ref 3.5–5.1)
Sodium: 137 mmol/L (ref 135–145)

## 2020-09-23 LAB — HEMOGLOBIN AND HEMATOCRIT, BLOOD
HCT: 35.3 % — ABNORMAL LOW (ref 36.0–46.0)
Hemoglobin: 11.6 g/dL — ABNORMAL LOW (ref 12.0–15.0)

## 2020-09-23 MED ORDER — CHLORHEXIDINE GLUCONATE CLOTH 2 % EX PADS
6.0000 | MEDICATED_PAD | Freq: Every day | CUTANEOUS | Status: DC
  Administered 2020-09-23: 6 via TOPICAL

## 2020-09-23 MED ORDER — INFLUENZA VAC A&B SA ADJ QUAD 0.5 ML IM PRSY
0.5000 mL | PREFILLED_SYRINGE | INTRAMUSCULAR | Status: AC
  Administered 2020-09-23: 0.5 mL via INTRAMUSCULAR
  Filled 2020-09-23: qty 0.5

## 2020-09-23 NOTE — Progress Notes (Signed)
Pt given and explained discharge instructions. Pt IV's removed. Pt given Flu vaccination prior to d/c along with information paper. Pt dressed in personal clothing and all belongings gathered for pt. Pt taken to main entrance via wheelchair.

## 2020-09-23 NOTE — Progress Notes (Signed)
Patient ambulated on hall way this morning via walker, tolerated well.

## 2020-09-23 NOTE — Progress Notes (Signed)
Pt given pain medication before d/c. This RN only gave pt one percocet tablet (5-325). Pt was already taken out of system before this RN could return the medication into the pyxis. One percocet was wasted in the sharps in the med room with Gerri Spore, RN and Minette Brine, RN as witnesses.

## 2020-09-23 NOTE — Discharge Summary (Signed)
Date of admission: 09/22/2020  Date of discharge: 09/23/2020  Admission diagnosis: Renal mass  Discharge diagnosis: Renal mass  History and Physical: For full details, please see admission history and physical. Briefly, Veronica Finley is a 69 y.o.  With renal mass. After discussing management/treatment options, she elected to proceed with surgical treatment.  Hospital Course: Veronica Finley was taken to the operating room on 09/22/2020 and underwent a robotic assisted laparoscopic radical nephrectomy. SHe tolerated this procedure well and without complications. Postoperatively, she was able to be transferred to a regular hospital room following recovery from anesthesia.  She was able to begin ambulating the night of surgery and remained hemodynamically stable overnight.  She had excellent urine output. Foley catheter was removed on POD #1.  SHe was transitioned to oral pain medication, tolerated a regular diet, and had met all discharge criteria and was able to be discharged home later on POD#1.  Laboratory values: Recent Labs    09/22/20 1628 09/23/20 0659  HGB 14.2 11.6*  HCT 44.3 35.3*    Disposition: Home  Discharge instruction: Se was instructed to be ambulatory but to refrain from heavy lifting, strenuous activity, or driving.   Discharge medications:   Allergies as of 09/23/2020      Reactions   Codeine Other (See Comments)   "makes pass out"   Thimerosal Itching      Medication List    STOP taking these medications   gabapentin 300 MG capsule Commonly known as: NEURONTIN   meloxicam 15 MG tablet Commonly known as: MOBIC   methocarbamol 500 MG tablet Commonly known as: Robaxin   predniSONE 10 MG tablet Commonly known as: DELTASONE   VITAMIN C PO   Vitamin D 50 MCG (2000 UT) tablet     TAKE these medications   acetaminophen 500 MG tablet Commonly known as: TYLENOL Take 500-1,000 mg by mouth every 6 (six) hours as needed (for pain.).   diltiazem 240  MG 24 hr capsule Commonly known as: CARDIZEM CD Take 1 capsule (240 mg total) by mouth daily.   docusate sodium 100 MG capsule Commonly known as: COLACE Take 1 capsule (100 mg total) by mouth 2 (two) times daily.   FLUoxetine 20 MG capsule Commonly known as: PROZAC Take 1 capsule (20 mg total) by mouth daily. Needs office visit   levothyroxine 175 MCG tablet Commonly known as: SYNTHROID TAKE 1 TABLET EVERY DAY WITH BREAKFAST What changed: See the new instructions.   losartan-hydrochlorothiazide 100-25 MG tablet Commonly known as: HYZAAR Take 0.5 tablets by mouth daily.   promethazine 12.5 MG tablet Commonly known as: PHENERGAN Take 1 tablet (12.5 mg total) by mouth every 4 (four) hours as needed for nausea or vomiting.   rOPINIRole 4 MG 24 hr tablet Commonly known as: REQUIP XL Take 1 tablet (4 mg total) by mouth at bedtime.   rosuvastatin 10 MG tablet Commonly known as: CRESTOR Take 1 tablet (10 mg total) by mouth at bedtime.   traMADol 50 MG tablet Commonly known as: Ultram Take 1-2 tablets (50-100 mg total) by mouth every 6 (six) hours as needed for moderate pain or severe pain.   Voltaren 1 % Gel Generic drug: diclofenac Sodium Apply 2 g topically daily as needed (hip pain).       Followup: approximately 2 weeks  to discuss his surgical pathology results.

## 2020-09-26 DIAGNOSIS — Z85528 Personal history of other malignant neoplasm of kidney: Secondary | ICD-10-CM | POA: Diagnosis not present

## 2020-09-26 LAB — SURGICAL PATHOLOGY

## 2020-10-02 DIAGNOSIS — C642 Malignant neoplasm of left kidney, except renal pelvis: Secondary | ICD-10-CM | POA: Diagnosis not present

## 2020-10-06 DIAGNOSIS — C642 Malignant neoplasm of left kidney, except renal pelvis: Secondary | ICD-10-CM | POA: Diagnosis not present

## 2020-10-06 LAB — BASIC METABOLIC PANEL
BUN: 17 (ref 4–21)
CO2: 28 — AB (ref 13–22)
Chloride: 107 (ref 99–108)
Creatinine: 1.2 — AB (ref 0.5–1.1)
Glucose: 96
Potassium: 4.3 (ref 3.4–5.3)
Sodium: 143 (ref 137–147)

## 2020-10-06 LAB — COMPREHENSIVE METABOLIC PANEL: Calcium: 9.7 (ref 8.7–10.7)

## 2020-10-12 ENCOUNTER — Other Ambulatory Visit: Payer: Self-pay | Admitting: Family Medicine

## 2020-10-19 ENCOUNTER — Encounter: Payer: Medicare PPO | Admitting: Family Medicine

## 2020-10-23 ENCOUNTER — Other Ambulatory Visit: Payer: Self-pay | Admitting: Surgery

## 2020-10-23 ENCOUNTER — Other Ambulatory Visit: Payer: Self-pay

## 2020-10-23 ENCOUNTER — Ambulatory Visit (INDEPENDENT_AMBULATORY_CARE_PROVIDER_SITE_OTHER)
Admission: RE | Admit: 2020-10-23 | Discharge: 2020-10-23 | Disposition: A | Discharge status: Home / Self Care | Payer: Medicare PPO | Source: Ambulatory Visit | Attending: Family Medicine | Admitting: Family Medicine

## 2020-10-23 ENCOUNTER — Encounter: Payer: Self-pay | Admitting: Family Medicine

## 2020-10-23 ENCOUNTER — Ambulatory Visit: Payer: Medicare PPO | Admitting: Family Medicine

## 2020-10-23 VITALS — BP 120/72 | HR 78 | Temp 98.2°F | Wt 239.2 lb

## 2020-10-23 DIAGNOSIS — C642 Malignant neoplasm of left kidney, except renal pelvis: Secondary | ICD-10-CM

## 2020-10-23 DIAGNOSIS — Z96653 Presence of artificial knee joint, bilateral: Secondary | ICD-10-CM

## 2020-10-23 DIAGNOSIS — Z905 Acquired absence of kidney: Secondary | ICD-10-CM | POA: Diagnosis not present

## 2020-10-23 DIAGNOSIS — K439 Ventral hernia without obstruction or gangrene: Secondary | ICD-10-CM | POA: Diagnosis not present

## 2020-10-23 DIAGNOSIS — M159 Polyosteoarthritis, unspecified: Secondary | ICD-10-CM

## 2020-10-23 DIAGNOSIS — M8949 Other hypertrophic osteoarthropathy, multiple sites: Secondary | ICD-10-CM

## 2020-10-23 DIAGNOSIS — M25551 Pain in right hip: Secondary | ICD-10-CM

## 2020-10-23 DIAGNOSIS — M25552 Pain in left hip: Secondary | ICD-10-CM | POA: Diagnosis not present

## 2020-10-23 DIAGNOSIS — M48061 Spinal stenosis, lumbar region without neurogenic claudication: Secondary | ICD-10-CM | POA: Diagnosis not present

## 2020-10-23 DIAGNOSIS — M160 Bilateral primary osteoarthritis of hip: Secondary | ICD-10-CM | POA: Diagnosis not present

## 2020-10-23 NOTE — Patient Instructions (Signed)
Please follow up as scheduled for your next visit with me: 12/14/2020   If you have any questions or concerns, please don't hesitate to send me a message via MyChart or call the office at [phone]. Thank you for visiting with [place] today! It's our pleasure caring for you.  Please go to our [facility] office to get your xrays done. You can walk in M-F between 8:30am- noon or 1pm - 5pm. Tell them you are there for xrays ordered by me. They will send me the results, then I will let you know the results with instructions.   Address: [address].  The Xray department is located in the basement.   Use tylenol ES 1-2 twice daily and add meloxicam only when needed. Can use tramadol as needed as well.

## 2020-10-23 NOTE — Progress Notes (Signed)
Subjective  CC:  Chief Complaint  Patient presents with  . Hip Pain    bilateral, constant pain progressively worsening    HPI: Veronica Finley is a 69 y.o. female who presents to the office today to address the problems listed above in the chief complaint.  69 year old status post nephrectomy for renal cell carcinoma who is recovered well.  She is doing well from that standpoint.  Coping with that diagnosis well.  No further treatment recommended at this time.  However, she is now having progressively worse bilateral hip pain.  Worse with walking.  Describes pain in the groin bilaterally.  Feels stiff in the morning.  He has had osteoarthritis in multiple sites including DJD of the lumbar spine with spinal stenosis, status post bilateral knee replacements due to end-stage osteoarthritis of the knees.  Fortunately her knees are doing well.  She has no radicular symptoms at this time.  She does have chronic low back pain.  She is on gabapentin.  Her last MRI was reviewed again and showed spinal stenosis.  She was to get epidural steroid injections but this was postponed due to her renal cell carcinoma diagnosis.  She will have follow-up with neurosurgery soon.  Gabapentin has been helpful.  She is no longer having severe radicular symptoms.  Her hips have never been x-rayed.  She denies lateral hip pain or pain when lying on her sides.   Assessment  1. Bilateral hip pain      Plan   Bilateral hip pain: Check x-rays.  Concerning for osteoarthritis. Avoid nsaids due to nephrectomy. rec tylenol es bid and add tramadol if needed. To ortho if OA or SM   Lumbar spinal stenosis: will need f/u with neuro. Fortunately, gabapentin has been helpful.   Renal cell carcinoma s/p nephrectomy. Fortunately caught early. Coping and recovered well.   Follow up: Return for as scheduled.  12/14/2020  Orders Placed This Encounter  Procedures  . XR HIPS BILAT W OR W/O PELVIS 3-4 VIEWS   No orders of  the defined types were placed in this encounter.     I reviewed the patients updated PMH, FH, and SocHx.    Patient Active Problem List   Diagnosis Date Noted  . OSA on CPAP 10/26/2018    Priority: High  . Hx of laparoscopic gastric banding+ truncal vagotomy 03/24/2006 01/10/2014    Priority: High  . Spinal stenosis of lumbar region 07/08/2013    Priority: High  . Major depression, chronic 07/08/2013    Priority: High  . Essential hypertension 04/06/2012    Priority: High  . Acquired hypothyroidism 10/08/2011    Priority: High  . Mixed hyperlipidemia 10/08/2011    Priority: High  . Obesity (BMI 30-39.9) 12/17/2010    Priority: High  . S/P knee replacement 04/02/2019    Priority: Medium  . Restless legs 12/20/2010    Priority: Medium  . Osteoarthritis of multiple joints 12/17/2010    Priority: Medium  . Contact dermatitis due to chemicals 01/25/2019    Priority: Low  . Generalized hyperhidrosis 07/15/2016    Priority: Low  . AR (allergic rhinitis) 12/20/2010    Priority: Low  . Renal mass 09/22/2020  . Vitamin B12 deficiency 06/02/2020   Current Meds  Medication Sig  . acetaminophen (TYLENOL) 500 MG tablet Take 500-1,000 mg by mouth every 6 (six) hours as needed (for pain.).   Marland Kitchen diclofenac Sodium (VOLTAREN) 1 % GEL Apply 2 g topically daily as needed (hip pain).  Marland Kitchen  diltiazem (CARDIZEM CD) 240 MG 24 hr capsule Take 1 capsule (240 mg total) by mouth daily.  Marland Kitchen docusate sodium (COLACE) 100 MG capsule Take 1 capsule (100 mg total) by mouth 2 (two) times daily.  Marland Kitchen FLUoxetine (PROZAC) 20 MG capsule Take 1 capsule (20 mg total) by mouth daily. Needs office visit  . levothyroxine (SYNTHROID) 175 MCG tablet TAKE 1 TABLET EVERY DAY WITH BREAKFAST (Patient taking differently: Take 175 mcg by mouth daily before breakfast. )  . losartan-hydrochlorothiazide (HYZAAR) 100-25 MG tablet Take 0.5 tablets by mouth daily.  . promethazine (PHENERGAN) 12.5 MG tablet Take 1 tablet (12.5 mg total)  by mouth every 4 (four) hours as needed for nausea or vomiting.  Marland Kitchen rOPINIRole (REQUIP XL) 4 MG 24 hr tablet Take 1 tablet (4 mg total) by mouth at bedtime.  . rosuvastatin (CRESTOR) 10 MG tablet Take 1 tablet (10 mg total) by mouth daily.  . traMADol (ULTRAM) 50 MG tablet Take 1-2 tablets (50-100 mg total) by mouth every 6 (six) hours as needed for moderate pain or severe pain.    Allergies: Patient is allergic to codeine and thimerosal. Family History: Patient family history includes Arthritis in her brother, mother, and sister; Atrial fibrillation in her mother; Diabetes in her brother; Heart disease in her father; Hyperlipidemia in her brother and mother; Hypertension in her mother; Leukemia in her father. Social History:  Patient  reports that she has never smoked. She has never used smokeless tobacco. She reports current alcohol use. She reports that she does not use drugs.  Review of Systems: Constitutional: Negative for fever malaise or anorexia Cardiovascular: negative for chest pain Respiratory: negative for SOB or persistent cough Gastrointestinal: negative for abdominal pain  Objective  Vitals: BP 120/72   Pulse 78   Temp 98.2 F (36.8 C) (Temporal)   Wt 239 lb 3.2 oz (108.5 kg)   SpO2 96%   BMI 38.61 kg/m  General: no acute distress , A&Ox3 HEENT: PEERL, conjunctiva normal, neck is supple Cardiovascular:  RRR without murmur or gallop.  Respiratory:  Good breath sounds bilaterally, CTAB with normal respiratory effort, no flank pain Bilateral groin ttp with decreased ROM at bilateral hips; pain with external and internal rotation. No ttp over gr trochanteric bursa bilaterally, antalgic gait Skin:  Warm, no rashes     Commons side effects, risks, benefits, and alternatives for medications and treatment plan prescribed today were discussed, and the patient expressed understanding of the given instructions. Patient is instructed to call or message via MyChart if he/she  has any questions or concerns regarding our treatment plan. No barriers to understanding were identified. We discussed Red Flag symptoms and signs in detail. Patient expressed understanding regarding what to do in case of urgent or emergency type symptoms.   Medication list was reconciled, printed and provided to the patient in AVS. Patient instructions and summary information was reviewed with the patient as documented in the AVS. This note was prepared with assistance of Dragon voice recognition software. Occasional wrong-word or sound-a-like substitutions may have occurred due to the inherent limitations of voice recognition software  This visit occurred during the SARS-CoV-2 public health emergency.  Safety protocols were in place, including screening questions prior to the visit, additional usage of staff PPE, and extensive cleaning of exam room while observing appropriate contact time as indicated for disinfecting solutions.

## 2020-10-24 DIAGNOSIS — Z905 Acquired absence of kidney: Secondary | ICD-10-CM | POA: Insufficient documentation

## 2020-10-24 DIAGNOSIS — C642 Malignant neoplasm of left kidney, except renal pelvis: Secondary | ICD-10-CM | POA: Insufficient documentation

## 2020-11-03 DIAGNOSIS — C642 Malignant neoplasm of left kidney, except renal pelvis: Secondary | ICD-10-CM | POA: Diagnosis not present

## 2020-11-06 ENCOUNTER — Encounter: Payer: Self-pay | Admitting: Family Medicine

## 2020-11-07 MED ORDER — PREDNISONE 10 MG PO TABS: ORAL_TABLET | ORAL | 0 refills | Status: DC

## 2020-11-14 ENCOUNTER — Other Ambulatory Visit: Payer: Self-pay

## 2020-11-14 ENCOUNTER — Encounter (HOSPITAL_BASED_OUTPATIENT_CLINIC_OR_DEPARTMENT_OTHER): Payer: Self-pay | Admitting: Surgery

## 2020-11-20 ENCOUNTER — Other Ambulatory Visit (HOSPITAL_COMMUNITY): Payer: Medicare PPO

## 2020-11-21 ENCOUNTER — Encounter (HOSPITAL_BASED_OUTPATIENT_CLINIC_OR_DEPARTMENT_OTHER)
Admission: RE | Admit: 2020-11-21 | Discharge: 2020-11-21 | Disposition: A | Discharge status: Home / Self Care | Payer: Medicare PPO | Source: Ambulatory Visit | Attending: Surgery | Admitting: Surgery

## 2020-11-21 DIAGNOSIS — Z85528 Personal history of other malignant neoplasm of kidney: Secondary | ICD-10-CM | POA: Diagnosis not present

## 2020-11-21 DIAGNOSIS — Z79899 Other long term (current) drug therapy: Secondary | ICD-10-CM | POA: Diagnosis not present

## 2020-11-21 DIAGNOSIS — Z905 Acquired absence of kidney: Secondary | ICD-10-CM | POA: Diagnosis not present

## 2020-11-21 DIAGNOSIS — K8080 Other cholelithiasis without obstruction: Secondary | ICD-10-CM | POA: Diagnosis not present

## 2020-11-21 DIAGNOSIS — Z885 Allergy status to narcotic agent status: Secondary | ICD-10-CM | POA: Diagnosis not present

## 2020-11-21 DIAGNOSIS — Z9884 Bariatric surgery status: Secondary | ICD-10-CM | POA: Diagnosis not present

## 2020-11-21 DIAGNOSIS — Z01812 Encounter for preprocedural laboratory examination: Secondary | ICD-10-CM | POA: Insufficient documentation

## 2020-11-21 DIAGNOSIS — K439 Ventral hernia without obstruction or gangrene: Secondary | ICD-10-CM | POA: Diagnosis not present

## 2020-11-21 DIAGNOSIS — Z791 Long term (current) use of non-steroidal anti-inflammatories (NSAID): Secondary | ICD-10-CM | POA: Diagnosis not present

## 2020-11-21 DIAGNOSIS — Z6838 Body mass index (BMI) 38.0-38.9, adult: Secondary | ICD-10-CM | POA: Diagnosis not present

## 2020-11-21 DIAGNOSIS — Z79890 Hormone replacement therapy (postmenopausal): Secondary | ICD-10-CM | POA: Diagnosis not present

## 2020-11-21 LAB — BASIC METABOLIC PANEL
Anion gap: 11 (ref 5–15)
BUN: 19 mg/dL (ref 8–23)
CO2: 23 mmol/L (ref 22–32)
Calcium: 9.7 mg/dL (ref 8.9–10.3)
Chloride: 103 mmol/L (ref 98–111)
Creatinine, Ser: 1.4 mg/dL — ABNORMAL HIGH (ref 0.44–1.00)
GFR, Estimated: 41 mL/min — ABNORMAL LOW (ref >60)
Glucose, Bld: 84 mg/dL (ref 70–99)
Potassium: 4 mmol/L (ref 3.5–5.1)
Sodium: 137 mmol/L (ref 135–145)

## 2020-11-21 MED ORDER — ENSURE PRE-SURGERY PO LIQD: 296.0000 mL | Freq: Once | ORAL | Status: DC

## 2020-11-21 NOTE — Progress Notes (Signed)

## 2020-11-21 NOTE — Progress Notes (Signed)
Pt not tested for covid today 11/21/20 due to pt testing + for covid on 08/28/20 (pt will bring results on the day of procedure). Based on the guidelines the pt is in the 90 day window to not retest. The pt is still expected to quarantine until their procedure. Therefore, the pt can still have the scheduled procedure.   These are the guidelines as follows:  Guidance: Patient previously tested + COVID; now past 90 day window seeking elective surgery (asymptomatic)  Retest patient If negative, proceed with surgery If positive, postpone surgery for 10 days from positive test Patient to quarantine for the (10 days) Do not retest again prior to surgery (even if scheduled a couple of weeks out) Use standard precautions for surgery

## 2020-11-22 NOTE — H&P (Signed)
  Veronica Finley  Location: [facility] Patient #: [id number] DOB: February 05, 1951 Divorced / Language: [place] / Race: White Female   History of Present Illness The patient is a 70 year old female who presents with an abdominal wall hernia.She is here for follow-up regarding her ventral hernia, lap band, and gallstones. She has now had a robotic nephrectomy for kidney cancer and tolerated this well.   Allergies  Codeine Sulfate *ANALGESICS - OPIOID*  Allergies Reconciled   Medication History (Chanel Nolan, CMA;  FLUoxetine HCl (20MG  Capsule, Oral) Active. dilTIAZem HCl ER Coated Beads (240MG  Capsule ER 24HR, Oral) Active. Gabapentin (300MG  Capsule, Oral) Active. Levothyroxine Sodium ( Tablet, Oral) Active. Losartan Potassium-HCTZ (100-25MG  Tablet, Oral) Active. Meloxicam (15MG  Tablet, Oral) Active. predniSONE (10MG  Tablet, Oral) Active. rOPINIRole HCl ER (4MG  Tablet ER 24HR, Oral) Active. Rosuvastatin Calcium (10MG  Tablet, Oral) Active. Ascorbic Acid (500MG  Tablet, Oral) Active. Vitamin D (50 MCG(2000 UT) Capsule, Oral) Active. Medications Reconciled  Vitals (Chanel Nolan CMA; 10/23/2020 11:27 AM Weight: 240.5 lb Height: 66in Body Surface Area: 2.16 m Body Mass Index: 38.82 kg/m  Temp.: 97.24F  Pulse: 92 (Regular)  BP: 120/74(Sitting, Left Arm, Standard)       Physical Exam  The physical exam findings are as follows: Note: She appears well exam  Her abdomen is soft. Her incisions are healing well. The hernia above the umbilicus is reducible and nontender.    Assessment & Plan   VENTRAL HERNIA (K43.9)  Impression: At this time, we discussed her hernia, her 2 centimeter incidental gallstone, and her lap band. As the hernia is the only thing that is symptomatic, I would recommend a small open ventral hernia repair with mesh as an outpatient. As she has no symptoms from the gallstone, there are currently no indications to  perform a cholecystectomy or remove her lap band. I also discussed the lap band with one of her bariatric surgeons who agrees.  I discussed proceeding with the open ventral hernia repair with mesh. I discussed the risk which includes but is not limited to bleeding, infection, use of mesh, hernia recurrence, injury to surrounding structures, etc. Surgery will be scheduled in January 2022 for her to recover further from her nephrectomy. Current Plans

## 2020-11-23 ENCOUNTER — Ambulatory Visit (HOSPITAL_BASED_OUTPATIENT_CLINIC_OR_DEPARTMENT_OTHER): Payer: Medicare PPO | Admitting: Anesthesiology

## 2020-11-23 ENCOUNTER — Other Ambulatory Visit: Payer: Self-pay

## 2020-11-23 ENCOUNTER — Ambulatory Visit (HOSPITAL_BASED_OUTPATIENT_CLINIC_OR_DEPARTMENT_OTHER)
Admission: RE | Admit: 2020-11-23 | Discharge: 2020-11-23 | Disposition: A | Discharge status: Home / Self Care | Payer: Medicare PPO | Attending: Surgery | Admitting: Surgery

## 2020-11-23 ENCOUNTER — Encounter (HOSPITAL_BASED_OUTPATIENT_CLINIC_OR_DEPARTMENT_OTHER)
Admission: RE | Disposition: A | Discharge status: Home / Self Care | Payer: Self-pay | Source: Home / Self Care | Attending: Surgery

## 2020-11-23 ENCOUNTER — Encounter (HOSPITAL_BASED_OUTPATIENT_CLINIC_OR_DEPARTMENT_OTHER): Payer: Self-pay | Admitting: Surgery

## 2020-11-23 DIAGNOSIS — K439 Ventral hernia without obstruction or gangrene: Secondary | ICD-10-CM | POA: Insufficient documentation

## 2020-11-23 DIAGNOSIS — Z79899 Other long term (current) drug therapy: Secondary | ICD-10-CM | POA: Insufficient documentation

## 2020-11-23 DIAGNOSIS — Z79890 Hormone replacement therapy (postmenopausal): Secondary | ICD-10-CM | POA: Insufficient documentation

## 2020-11-23 DIAGNOSIS — Z905 Acquired absence of kidney: Secondary | ICD-10-CM | POA: Diagnosis not present

## 2020-11-23 DIAGNOSIS — I10 Essential (primary) hypertension: Secondary | ICD-10-CM | POA: Diagnosis not present

## 2020-11-23 DIAGNOSIS — Z9884 Bariatric surgery status: Secondary | ICD-10-CM | POA: Diagnosis not present

## 2020-11-23 DIAGNOSIS — Z6838 Body mass index (BMI) 38.0-38.9, adult: Secondary | ICD-10-CM | POA: Diagnosis not present

## 2020-11-23 DIAGNOSIS — Z791 Long term (current) use of non-steroidal anti-inflammatories (NSAID): Secondary | ICD-10-CM | POA: Insufficient documentation

## 2020-11-23 DIAGNOSIS — Z885 Allergy status to narcotic agent status: Secondary | ICD-10-CM | POA: Diagnosis not present

## 2020-11-23 DIAGNOSIS — G4733 Obstructive sleep apnea (adult) (pediatric): Secondary | ICD-10-CM | POA: Diagnosis not present

## 2020-11-23 DIAGNOSIS — Z9989 Dependence on other enabling machines and devices: Secondary | ICD-10-CM | POA: Diagnosis not present

## 2020-11-23 DIAGNOSIS — K8080 Other cholelithiasis without obstruction: Secondary | ICD-10-CM | POA: Insufficient documentation

## 2020-11-23 DIAGNOSIS — Z85528 Personal history of other malignant neoplasm of kidney: Secondary | ICD-10-CM | POA: Insufficient documentation

## 2020-11-23 HISTORY — PX: VENTRAL HERNIA REPAIR: SHX424

## 2020-11-23 HISTORY — DX: Family history of other specified conditions: Z84.89

## 2020-11-23 SURGERY — REPAIR, HERNIA, VENTRAL
Anesthesia: General | Site: Abdomen

## 2020-11-23 MED ORDER — CEFAZOLIN SODIUM-DEXTROSE 2-4 GM/100ML-% IV SOLN
2.0000 g | INTRAVENOUS | Status: AC
  Administered 2020-11-23: 2 g via INTRAVENOUS

## 2020-11-23 MED ORDER — CEFAZOLIN SODIUM-DEXTROSE 2-4 GM/100ML-% IV SOLN
INTRAVENOUS | Status: AC
  Filled 2020-11-23: qty 100

## 2020-11-23 MED ORDER — OXYCODONE HCL 5 MG PO TABS
5.0000 mg | ORAL_TABLET | Freq: Once | ORAL | Status: AC | PRN
  Administered 2020-11-23: 5 mg via ORAL

## 2020-11-23 MED ORDER — FENTANYL CITRATE (PF) 100 MCG/2ML IJ SOLN
INTRAMUSCULAR | Status: AC
  Filled 2020-11-23: qty 2

## 2020-11-23 MED ORDER — LACTATED RINGERS IV SOLN: INTRAVENOUS | Status: DC

## 2020-11-23 MED ORDER — ONDANSETRON HCL 4 MG/2ML IJ SOLN
INTRAMUSCULAR | Status: DC | PRN
  Administered 2020-11-23: 4 mg via INTRAVENOUS

## 2020-11-23 MED ORDER — GABAPENTIN 300 MG PO CAPS
300.0000 mg | ORAL_CAPSULE | ORAL | Status: AC
  Administered 2020-11-23: 300 mg via ORAL

## 2020-11-23 MED ORDER — LIDOCAINE 2% (20 MG/ML) 5 ML SYRINGE
INTRAMUSCULAR | Status: AC
  Filled 2020-11-23: qty 5

## 2020-11-23 MED ORDER — ACETAMINOPHEN 500 MG PO TABS
ORAL_TABLET | ORAL | Status: AC
  Filled 2020-11-23: qty 2

## 2020-11-23 MED ORDER — LIDOCAINE HCL (CARDIAC) PF 100 MG/5ML IV SOSY
PREFILLED_SYRINGE | INTRAVENOUS | Status: DC | PRN
  Administered 2020-11-23: 50 mg via INTRAVENOUS

## 2020-11-23 MED ORDER — SUCCINYLCHOLINE CHLORIDE 200 MG/10ML IV SOSY
PREFILLED_SYRINGE | INTRAVENOUS | Status: AC
  Filled 2020-11-23: qty 10

## 2020-11-23 MED ORDER — DIPHENHYDRAMINE HCL 50 MG/ML IJ SOLN
INTRAMUSCULAR | Status: AC
  Filled 2020-11-23: qty 1

## 2020-11-23 MED ORDER — ACETAMINOPHEN 500 MG PO TABS
1000.0000 mg | ORAL_TABLET | ORAL | Status: AC
  Administered 2020-11-23: 1000 mg via ORAL

## 2020-11-23 MED ORDER — ONDANSETRON HCL 4 MG/2ML IJ SOLN
INTRAMUSCULAR | Status: AC
  Filled 2020-11-23: qty 2

## 2020-11-23 MED ORDER — FENTANYL CITRATE (PF) 100 MCG/2ML IJ SOLN
INTRAMUSCULAR | Status: DC | PRN
  Administered 2020-11-23: 100 ug via INTRAVENOUS

## 2020-11-23 MED ORDER — FENTANYL CITRATE (PF) 100 MCG/2ML IJ SOLN
25.0000 ug | INTRAMUSCULAR | Status: DC | PRN
  Administered 2020-11-23: 50 ug via INTRAVENOUS

## 2020-11-23 MED ORDER — MIDAZOLAM HCL 5 MG/5ML IJ SOLN
INTRAMUSCULAR | Status: DC | PRN
  Administered 2020-11-23: 2 mg via INTRAVENOUS

## 2020-11-23 MED ORDER — ONDANSETRON HCL 4 MG/2ML IJ SOLN: 4.0000 mg | Freq: Once | INTRAMUSCULAR | Status: DC | PRN

## 2020-11-23 MED ORDER — EPHEDRINE 5 MG/ML INJ
INTRAVENOUS | Status: AC
  Filled 2020-11-23: qty 10

## 2020-11-23 MED ORDER — DIPHENHYDRAMINE HCL 50 MG/ML IJ SOLN
INTRAMUSCULAR | Status: DC | PRN
  Administered 2020-11-23: 6.25 mg via INTRAVENOUS

## 2020-11-23 MED ORDER — OXYCODONE HCL 5 MG PO TABS
ORAL_TABLET | ORAL | Status: AC
  Filled 2020-11-23: qty 1

## 2020-11-23 MED ORDER — OXYCODONE HCL 5 MG/5ML PO SOLN: 5.0000 mg | Freq: Once | ORAL | Status: AC | PRN

## 2020-11-23 MED ORDER — DEXAMETHASONE SODIUM PHOSPHATE 4 MG/ML IJ SOLN
INTRAMUSCULAR | Status: DC | PRN
  Administered 2020-11-23: 5 mg via INTRAVENOUS

## 2020-11-23 MED ORDER — OXYCODONE HCL 5 MG PO TABS: 5.0000 mg | ORAL_TABLET | Freq: Four times a day (QID) | ORAL | 0 refills | Status: DC | PRN

## 2020-11-23 MED ORDER — CHLORHEXIDINE GLUCONATE CLOTH 2 % EX PADS: 6.0000 | MEDICATED_PAD | Freq: Once | CUTANEOUS | Status: DC

## 2020-11-23 MED ORDER — GABAPENTIN 300 MG PO CAPS
ORAL_CAPSULE | ORAL | Status: AC
  Filled 2020-11-23: qty 1

## 2020-11-23 MED ORDER — ACETAMINOPHEN 325 MG PO TABS
325.0000 mg | ORAL_TABLET | ORAL | Status: DC | PRN
Start: 2020-11-23 — End: 2020-11-23

## 2020-11-23 MED ORDER — DEXAMETHASONE SODIUM PHOSPHATE 10 MG/ML IJ SOLN
INTRAMUSCULAR | Status: AC
  Filled 2020-11-23: qty 1

## 2020-11-23 MED ORDER — PHENYLEPHRINE 40 MCG/ML (10ML) SYRINGE FOR IV PUSH (FOR BLOOD PRESSURE SUPPORT)
PREFILLED_SYRINGE | INTRAVENOUS | Status: AC
  Filled 2020-11-23: qty 10

## 2020-11-23 MED ORDER — PROPOFOL 10 MG/ML IV BOLUS
INTRAVENOUS | Status: DC | PRN
  Administered 2020-11-23: 200 mg via INTRAVENOUS

## 2020-11-23 MED ORDER — BUPIVACAINE-EPINEPHRINE 0.5% -1:200000 IJ SOLN
INTRAMUSCULAR | Status: DC | PRN
  Administered 2020-11-23: 20 mL

## 2020-11-23 MED ORDER — ACETAMINOPHEN 160 MG/5ML PO SOLN: 325.0000 mg | ORAL | Status: DC | PRN

## 2020-11-23 MED ORDER — MIDAZOLAM HCL 2 MG/2ML IJ SOLN
INTRAMUSCULAR | Status: AC
  Filled 2020-11-23: qty 2

## 2020-11-23 MED ORDER — EPHEDRINE SULFATE 50 MG/ML IJ SOLN
INTRAMUSCULAR | Status: DC | PRN
  Administered 2020-11-23: 10 mg via INTRAVENOUS

## 2020-11-23 MED ORDER — MEPERIDINE HCL 25 MG/ML IJ SOLN: 6.2500 mg | INTRAMUSCULAR | Status: DC | PRN

## 2020-11-23 SURGICAL SUPPLY — 62 items
ADH SKN CLS APL DERMABOND .7 (GAUZE/BANDAGES/DRESSINGS) ×1
APL PRP STRL LF DISP 70% ISPRP (MISCELLANEOUS) ×1
BLADE CLIPPER SURG (BLADE) IMPLANT
BLADE SURG 10 STRL SS (BLADE) IMPLANT
BLADE SURG 15 STRL LF DISP TIS (BLADE) ×1 IMPLANT
BLADE SURG 15 STRL SS (BLADE) ×2
CANISTER SUCT 1200ML W/VALVE (MISCELLANEOUS) IMPLANT
CHLORAPREP W/TINT 26 (MISCELLANEOUS) ×2 IMPLANT
CLEANER CAUTERY TIP 5X5 PAD (MISCELLANEOUS) ×1 IMPLANT
COVER BACK TABLE 60X90IN (DRAPES) ×2 IMPLANT
COVER MAYO STAND STRL (DRAPES) ×2 IMPLANT
COVER WAND RF STERILE (DRAPES) IMPLANT
DECANTER SPIKE VIAL GLASS SM (MISCELLANEOUS) IMPLANT
DERMABOND ADVANCED (GAUZE/BANDAGES/DRESSINGS) ×1
DERMABOND ADVANCED .7 DNX12 (GAUZE/BANDAGES/DRESSINGS) ×2 IMPLANT
DRAPE LAPAROTOMY 100X72 PEDS (DRAPES) ×2 IMPLANT
DRAPE UTILITY XL STRL (DRAPES) ×2 IMPLANT
DRSG TEGADERM 2-3/8X2-3/4 SM (GAUZE/BANDAGES/DRESSINGS) IMPLANT
ELECT REM PT RETURN 9FT ADLT (ELECTROSURGICAL) ×2
ELECTRODE REM PT RTRN 9FT ADLT (ELECTROSURGICAL) ×1 IMPLANT
GAUZE SPONGE 4X4 12PLY STRL (GAUZE/BANDAGES/DRESSINGS) IMPLANT
GLOVE BIOGEL PI IND STRL 7.0 (GLOVE) IMPLANT
GLOVE BIOGEL PI INDICATOR 7.0 (GLOVE) ×2
GLOVE SRG 8 PF TXTR STRL LF DI (GLOVE) IMPLANT
GLOVE SURG SIGNA 7.5 PF LTX (GLOVE) ×2 IMPLANT
GLOVE SURG SS PI 7.0 STRL IVOR (GLOVE) ×1 IMPLANT
GLOVE SURG SS PI 8.0 STRL IVOR (GLOVE) ×1 IMPLANT
GLOVE SURG UNDER POLY LF SZ8 (GLOVE) ×2
GOWN STRL REUS W/ TWL LRG LVL3 (GOWN DISPOSABLE) ×1 IMPLANT
GOWN STRL REUS W/ TWL XL LVL3 (GOWN DISPOSABLE) ×1 IMPLANT
GOWN STRL REUS W/TWL LRG LVL3 (GOWN DISPOSABLE) ×2
GOWN STRL REUS W/TWL XL LVL3 (GOWN DISPOSABLE) ×4
MESH VENTRALEX ST 2.5 CRC MED (Mesh General) ×1 IMPLANT
NDL HYPO 25X1 1.5 SAFETY (NEEDLE) ×1 IMPLANT
NEEDLE HYPO 25X1 1.5 SAFETY (NEEDLE) ×2 IMPLANT
NS IRRIG 1000ML POUR BTL (IV SOLUTION) IMPLANT
PACK BASIN DAY SURGERY FS (CUSTOM PROCEDURE TRAY) ×2 IMPLANT
PAD CLEANER CAUTERY TIP 5X5 (MISCELLANEOUS) ×1
PENCIL SMOKE EVACUATOR (MISCELLANEOUS) ×2 IMPLANT
SLEEVE SCD COMPRESS KNEE MED (MISCELLANEOUS) ×2 IMPLANT
SPONGE LAP 4X18 RFD (DISPOSABLE) ×2 IMPLANT
STAPLER VISISTAT 35W (STAPLE) IMPLANT
SUT ETHIBOND 0 MO6 C/R (SUTURE) IMPLANT
SUT MNCRL AB 4-0 PS2 18 (SUTURE) ×2 IMPLANT
SUT NOVA NAB DX-16 0-1 5-0 T12 (SUTURE) ×2 IMPLANT
SUT NOVA NAB GS-21 1 T12 (SUTURE) IMPLANT
SUT VIC AB 2-0 SH 27 (SUTURE)
SUT VIC AB 2-0 SH 27XBRD (SUTURE) IMPLANT
SUT VIC AB 3-0 FS2 27 (SUTURE) IMPLANT
SUT VIC AB 3-0 SH 27 (SUTURE) ×4
SUT VIC AB 3-0 SH 27X BRD (SUTURE) ×1 IMPLANT
SUT VIC AB 4-0 RB1 27 (SUTURE)
SUT VIC AB 4-0 RB1 27X BRD (SUTURE) IMPLANT
SUT VIC AB 5-0 PS2 18 (SUTURE) IMPLANT
SUT VICRYL 4-0 PS2 18IN ABS (SUTURE) IMPLANT
SUT VICRYL AB 2 0 TIE (SUTURE) IMPLANT
SUT VICRYL AB 2 0 TIES (SUTURE)
SYR BULB EAR ULCER 3OZ GRN STR (SYRINGE) IMPLANT
SYR CONTROL 10ML LL (SYRINGE) ×2 IMPLANT
TOWEL GREEN STERILE FF (TOWEL DISPOSABLE) ×2 IMPLANT
TUBE CONNECTING 20X1/4 (TUBING) IMPLANT
YANKAUER SUCT BULB TIP NO VENT (SUCTIONS) IMPLANT

## 2020-11-23 NOTE — Anesthesia Preprocedure Evaluation (Addendum)
Anesthesia Evaluation  Patient identified by MRN, date of birth, ID band Patient awake    Reviewed: Allergy & Precautions, NPO status , Patient's Chart, lab work & pertinent test results  History of Anesthesia Complications (+) Family history of anesthesia reactionNegative for: history of anesthetic complications  Airway Mallampati: II  TM Distance: >3 FB Neck ROM: Full    Dental  (+) Teeth Intact, Dental Advisory Given, Caps,    Pulmonary sleep apnea and Continuous Positive Airway Pressure Ventilation ,    Pulmonary exam normal breath sounds clear to auscultation       Cardiovascular hypertension, Pt. on medications Normal cardiovascular exam Rhythm:Regular Rate:Normal     Neuro/Psych PSYCHIATRIC DISORDERS Depression negative neurological ROS     GI/Hepatic negative GI ROS, Neg liver ROS,   Endo/Other  Hypothyroidism Morbid obesity  Renal/GU LEFT RENAL MASS     Musculoskeletal  (+) Arthritis ,   Abdominal   Peds  Hematology negative hematology ROS (+)   Anesthesia Other Findings Day of surgery medications reviewed with the patient.  Reproductive/Obstetrics                            Anesthesia Physical  Anesthesia Plan  ASA: II  Anesthesia Plan: General   Post-op Pain Management:    Induction: Intravenous  PONV Risk Score and Plan: 3 and Dexamethasone and Ondansetron  Airway Management Planned: LMA  Additional Equipment: None  Intra-op Plan:   Post-operative Plan: Extubation in OR  Informed Consent: I have reviewed the patients History and Physical, chart, labs and discussed the procedure including the risks, benefits and alternatives for the proposed anesthesia with the patient or authorized representative who has indicated his/her understanding and acceptance.       Plan Discussed with: CRNA, Anesthesiologist and Surgeon  Anesthesia Plan Comments:         Anesthesia Quick Evaluation

## 2020-11-23 NOTE — Transfer of Care (Signed)
Immediate Anesthesia Transfer of Care Note  Patient: Veronica Finley  Procedure(s) Performed: VENTRAL HERNIA REPAIR WITH MESH (N/A Abdomen)  Patient Location: PACU  Anesthesia Type:General  Level of Consciousness: awake, alert , oriented, drowsy and patient cooperative  Airway & Oxygen Therapy: Patient Spontanous Breathing and Patient connected to face mask oxygen  Post-op Assessment: Report given to RN and Post -op Vital signs reviewed and stable  Post vital signs: Reviewed and stable  Last Vitals:  Vitals Value Taken Time  BP    Temp    Pulse 80 11/23/20 0920  Resp 14 11/23/20 0920  SpO2 95 % 11/23/20 0920  Vitals shown include unvalidated device data.  Last Pain:  Vitals:   11/23/20 0706  TempSrc: Oral  PainSc: 0-No pain         Complications: No complications documented.

## 2020-11-23 NOTE — Anesthesia Postprocedure Evaluation (Signed)
Anesthesia Post Note  Patient: Veronica Finley  Procedure(s) Performed: VENTRAL HERNIA REPAIR WITH MESH (N/A Abdomen)     Patient location during evaluation: PACU Anesthesia Type: General Level of consciousness: awake and alert Pain management: pain level controlled Vital Signs Assessment: post-procedure vital signs reviewed and stable Respiratory status: spontaneous breathing, nonlabored ventilation, respiratory function stable and patient connected to nasal cannula oxygen Cardiovascular status: blood pressure returned to baseline and stable Postop Assessment: no apparent nausea or vomiting Anesthetic complications: no   No complications documented.  Last Vitals:  Vitals:   11/23/20 0952 11/23/20 1051  BP:  124/65  Pulse: 69 73  Resp: 14 14  Temp:  36.6 C  SpO2: 97% 97%    Last Pain:  Vitals:   11/23/20 1051  TempSrc:   PainSc: 2                  Chaysen Tillman

## 2020-11-23 NOTE — Discharge Instructions (Signed)
[facility] _______[facility]  UMBILICAL OR INGUINAL HERNIA REPAIR: POST OP INSTRUCTIONS  Always review your discharge instruction sheet given to you by the facility where your surgery was performed. IF YOU HAVE DISABILITY OR FAMILY LEAVE FORMS, YOU MUST BRING THEM TO THE OFFICE FOR PROCESSING.   DO NOT GIVE THEM TO YOUR DOCTOR.  1. A  prescription for pain medication may be given to you upon discharge.  Take your pain medication as prescribed, if needed.  If narcotic pain medicine is not needed, then you may take acetaminophen (Tylenol) or ibuprofen (Advil) as needed. 2. Take your usually prescribed medications unless otherwise directed. If you need a refill on your pain medication, please contact your pharmacy.  They will contact our office to request authorization. Prescriptions will not be filled after 5 pm or on week-ends. 3. You should follow a light diet the first 24 hours after arrival home, such as soup and crackers, etc.  Be sure to include lots of fluids daily.  Resume your normal diet the day after surgery. 4.Most patients will experience some swelling and bruising around the umbilicus or in the groin and scrotum.  Ice packs and reclining will help.  Swelling and bruising can take several days to resolve.  6. It is common to experience some constipation if taking pain medication after surgery.  Increasing fluid intake and taking a stool softener (such as Colace) will usually help or prevent this problem from occurring.  A mild laxative (Milk of Magnesia or Miralax) should be taken according to package directions if there are no bowel movements after 48 hours. 7. Unless discharge instructions indicate otherwise, you may remove your bandages 24-48 hours after surgery, and you may shower at that time.  You may have steri-strips (small skin tapes) in place directly over the incision.  These strips should be left on the skin for 7-10 days.  If your surgeon used skin glue on the  incision, you may shower in 24 hours.  The glue will flake off over the next 2-3 weeks.  Any sutures or staples will be removed at the office during your follow-up visit. 8. ACTIVITIES:  You may resume regular (light) daily activities beginning the next day--such as daily self-care, walking, climbing stairs--gradually increasing activities as tolerated.  You may have sexual intercourse when it is comfortable.  Refrain from any heavy lifting or straining until approved by your doctor.  a.You may drive when you are no longer taking prescription pain medication, you can comfortably wear a seatbelt, and you can safely maneuver your car and apply brakes. b.RETURN TO WORK:   _____________________________________________  9.You should see your doctor in the office for a follow-up appointment approximately 2-3 weeks after your surgery.  Make sure that you call for this appointment within a day or two after you arrive home to insure a convenient appointment time. 10.OTHER INSTRUCTIONS: __OK TO SHOWER STARTING TOMORROW ICE PACK, TYLENOL, AND IBUPROFEN ALSO FOR PAIN NO LIFTING OVER 15 POUNDS FOR 6 WEEKS_______________________    _____________________________________  WHEN TO CALL YOUR DOCTOR: 1. Fever over 101.0 2. Inability to urinate 3. Nausea and/or vomiting 4. Extreme swelling or bruising 5. Continued bleeding from incision. 6. Increased pain, redness, or drainage from the incision  The clinic staff is available to answer your questions during regular business hours.  Please don't hesitate to call and ask to speak to one of the nurses for clinical concerns.  If you have a medical emergency, go to the nearest emergency  room or call 911.  A surgeon from [facility] is always on call at the hospital   [address] [place], [place], [place]  [address] ?  [address], [place], [place]   [address] [phone] ? [phone] ? FAX [phone] Web site:  [email or web address]  No Tylenol until 1:09 pm   Post Anesthesia Home Care Instructions  Activity: Get plenty of rest for the remainder of the day. A responsible individual must stay with you for 24 hours following the procedure.  For the next 24 hours, DO NOT: -Drive a car -Paediatric nurse -Drink alcoholic beverages -Take any medication unless instructed by your physician -Make any legal decisions or sign important papers.  Meals: Start with liquid foods such as gelatin or soup. Progress to regular foods as tolerated. Avoid greasy, spicy, heavy foods. If nausea and/or vomiting occur, drink only clear liquids until the nausea and/or vomiting subsides. Call your physician if vomiting continues.  Special Instructions/Symptoms: Your throat may feel dry or sore from the anesthesia or the breathing tube placed in your throat during surgery. If this causes discomfort, gargle with warm salt water. The discomfort should disappear within 24 hours.  If you had a scopolamine patch placed behind your ear for the management of post- operative nausea and/or vomiting:  1. The medication in the patch is effective for 72 hours, after which it should be removed.  Wrap patch in a tissue and discard in the trash. Wash hands thoroughly with soap and water. 2. You may remove the patch earlier than 72 hours if you experience unpleasant side effects which may include dry mouth, dizziness or visual disturbances. 3. Avoid touching the patch. Wash your hands with soap and water after contact with the patch.

## 2020-11-23 NOTE — Interval H&P Note (Signed)
History and Physical Interval Note:no change in H and P  11/23/2020 7:12 AM  [name]  has presented today for surgery, with the diagnosis of VENTRAL HERNIA.  The various methods of treatment have been discussed with the patient and family. After consideration of risks, benefits and other options for treatment, the patient has consented to  Procedure(s) with comments: VENTRAL HERNIA REPAIR WITH MESH (N/A) - LMA as a surgical intervention.  The patient's history has been reviewed, patient examined, no change in status, stable for surgery.  I have reviewed the patient's chart and labs.  Questions were answered to the patient's satisfaction.     Veronica Finley

## 2020-11-23 NOTE — Op Note (Signed)
VENTRAL HERNIA REPAIR WITH MESH  Procedure Note  Veronica Finley 11/23/2020   Pre-op Diagnosis: VENTRAL HERNIA     Post-op Diagnosis: same  Procedure(s): VENTRAL HERNIA REPAIR WITH MESH  Surgeon(s): Abigail Miyamoto, MD  Anesthesia: General  Staff:  Circulator: Lenn Cal, RN Relief Circulator: McDonough-Hughes, Maceo Pro, RN Scrub Person: Verdie Drown  Estimated Blood Loss: Minimal               Findings: The patient underwent small ventral hernia just above the umbilicus which was repaired with a 6.4 cm round ventral Prolene patch from [facility]  Procedure: The patient is brought to the operating room and identified as correct patient.  She was placed upon the operating table and general esthesia was induced.  Her abdomen was then prepped and draped in usual sterile fashion.  I anesthetized the skin and surrounding tissue with Marcaine.  I then made a longitudinal incision above the umbilicus with a scalpel.  I carried this down to the fascial defect and hernia sac with the cautery.  The defect was above the umbilicus.  I had to separate some overlying umbilical skin from the hernia sac with cautery.  I then opened the sac and all contents had already been reduced.  I then excised the sac.  Next a 6.4 cm round ventral light Prolene patch from Bard was brought to the field.  I placed it through the fascia opening and then pulled it up against the peritoneum with the ties.  I then sutured the mesh in place circumferentially with #1 Novafil sutures.  I then cut the ties and closed the fascia over the top of the mesh with 2 separate figure-of-eight #1 Novafil sutures.  Wide coverage of the fascia.  To be achieved.  I discussed the fascia further with Marcaine.  Hemostasis appeared to be achieved.  I then closed the subcutaneous tissue with interrupted 3-0 Vicryl sutures and closed the skin with a running 4-0 Monocryl.  Dermabond was then applied.  The patient tolerated the  procedure well.  All the counts were correct at the end of the procedure.  The patient was then extubated in the operating room and taken in a stable condition to the recovery room.          Abigail Miyamoto   Date: 11/23/2020  Time: 9:16 AM

## 2020-11-23 NOTE — Anesthesia Procedure Notes (Signed)
Procedure Name: LMA Insertion Date/Time: 11/23/2020 8:40 AM Performed by: Ronnette Hila, CRNA Pre-anesthesia Checklist: Patient identified, Emergency Drugs available, Suction available and Patient being monitored Patient Re-evaluated:Patient Re-evaluated prior to induction Oxygen Delivery Method: Circle system utilized Preoxygenation: Pre-oxygenation with 100% oxygen Induction Type: IV induction Ventilation: Mask ventilation without difficulty LMA: LMA inserted LMA Size: 4.0 Number of attempts: 1 Airway Equipment and Method: Bite block Placement Confirmation: positive ETCO2 Tube secured with: Tape Dental Injury: Teeth and Oropharynx as per pre-operative assessment

## 2020-11-24 ENCOUNTER — Encounter (HOSPITAL_BASED_OUTPATIENT_CLINIC_OR_DEPARTMENT_OTHER): Payer: Self-pay | Admitting: Surgery

## 2020-11-24 ENCOUNTER — Encounter: Payer: Self-pay | Admitting: Family Medicine

## 2020-11-24 NOTE — Telephone Encounter (Signed)
Please refer. Thanks. Notify pt

## 2020-11-27 ENCOUNTER — Other Ambulatory Visit: Payer: Self-pay

## 2020-11-27 DIAGNOSIS — M25551 Pain in right hip: Secondary | ICD-10-CM

## 2020-11-27 DIAGNOSIS — M25552 Pain in left hip: Secondary | ICD-10-CM

## 2020-12-05 ENCOUNTER — Ambulatory Visit: Payer: Medicare PPO | Admitting: Orthopaedic Surgery

## 2020-12-11 ENCOUNTER — Ambulatory Visit: Payer: Medicare PPO | Admitting: Orthopaedic Surgery

## 2020-12-11 ENCOUNTER — Encounter: Payer: Self-pay | Admitting: Orthopaedic Surgery

## 2020-12-11 VITALS — Ht 66.0 in | Wt 243.0 lb

## 2020-12-11 DIAGNOSIS — H04123 Dry eye syndrome of bilateral lacrimal glands: Secondary | ICD-10-CM | POA: Diagnosis not present

## 2020-12-11 DIAGNOSIS — M1611 Unilateral primary osteoarthritis, right hip: Secondary | ICD-10-CM | POA: Diagnosis not present

## 2020-12-11 DIAGNOSIS — H1045 Other chronic allergic conjunctivitis: Secondary | ICD-10-CM | POA: Diagnosis not present

## 2020-12-11 DIAGNOSIS — M160 Bilateral primary osteoarthritis of hip: Secondary | ICD-10-CM | POA: Diagnosis not present

## 2020-12-11 DIAGNOSIS — M1612 Unilateral primary osteoarthritis, left hip: Secondary | ICD-10-CM | POA: Diagnosis not present

## 2020-12-11 DIAGNOSIS — H40012 Open angle with borderline findings, low risk, left eye: Secondary | ICD-10-CM | POA: Diagnosis not present

## 2020-12-11 DIAGNOSIS — H25813 Combined forms of age-related cataract, bilateral: Secondary | ICD-10-CM | POA: Diagnosis not present

## 2020-12-11 NOTE — Progress Notes (Signed)
Office Visit Note   Patient: Veronica Finley           Date of Birth: [birth year]           MRN: [id number] Visit Date: 12/11/2020              Requested by: Leamon Arnt, [place],  [place] [address] PCP: Leamon Arnt, MD   Assessment & Plan: Visit Diagnoses:  1. Unilateral primary osteoarthritis, left hip   2. Unilateral primary osteoarthritis, right hip     Plan: I agree that she is in need of hip replacement surgery for both hips.  I would pursue her left hip per since she is requesting this as well because that is the more painful hip.  I did show her her x-rays and went over hip replacement model with her.  We had a long and thorough discussion about the risk and benefits of the surgery.  We talked about what to expect with her interoperative and postoperative course.  All questions and concerns were answered and addressed.  Given the failed conservative treatment and given the severity of her pain combined with the x-ray findings I feel this is medically necessary at this standpoint to help improve her quality of life, her mobility and her actives of daily living.  We will be in touch about scheduling surgery.  Follow-Up Instructions: Return for 2 weeks post-op.   Orders:  No orders of the defined types were placed in this encounter.  No orders of the defined types were placed in this encounter.     Procedures: No procedures performed   Clinical Data: No additional findings.   Subjective: Chief Complaint  Patient presents with  . Left Hip - Pain  . Right Hip - Pain  The patient comes in today to discuss hip replacement surgery.  She has severe bilateral hip pain with the left much worse than the right.  She has well-documented end-stage arthritis of both hips and there are x-rays on the canopy system for me to review.  She is not diabetic.  She has been dealing with bilateral hip pain for several years now but is gotten much worse over the  last few months.  She is not a diabetic.  She has had both her knees replaced by Dr. Theda Sers here in town.  She wanted to see me for her hip since I have taken care of her friend of hers.  Her hip pain is daily and is detrimentally affecting her mobility, her quality of life and activities day living.  She is worked on activity modification and weight loss as well as anti-inflammatories and time.  At this point her pain is 10 out of 10 and she is hoping to have a left hip replacement scheduled in the near future.  Her BMI is 39.22.  HPI  Review of Systems She currently denies any headache, chest pain, shortness of breath, fever, chills, nausea, vomiting  Objective: Vital Signs: Ht 5\' 6"  (1.676 m)   Wt 243 lb (110.2 kg)   BMI 39.22 kg/m   Physical Exam She is alert and orient x3 and in no acute distress Ortho Exam Examination of both hips shows significant pain with internal and external rotation and stiffness with rotation of both hips.  Her leg lengths are equal. Specialty Comments:  No specialty comments available.  Imaging: No results found. An AP pelvis and lateral both hips on the canopy system were reviewed.  She has severe arthritis in both hips with significant joint space narrowing, para-articular osteophytes around both hips and significant superior lateral flattening of the femoral head bilaterally.  PMFS History: Patient Active Problem List   Diagnosis Date Noted  . Unilateral primary osteoarthritis, left hip 12/11/2020  . Unilateral primary osteoarthritis, right hip 12/11/2020  . Status post nephrectomy 10/24/2020  . Renal cell carcinoma, left (Jessie) 10/24/2020  . Vitamin B12 deficiency 06/02/2020  . S/P knee replacement 04/02/2019  . Contact dermatitis due to chemicals 01/25/2019  . OSA on CPAP 10/26/2018  . Generalized hyperhidrosis 07/15/2016  . Hx of laparoscopic gastric banding+ truncal vagotomy 03/24/2006 01/10/2014  . Spinal stenosis of lumbar region 07/08/2013   . Major depression, chronic 07/08/2013  . Essential hypertension 04/06/2012  . Acquired hypothyroidism 10/08/2011  . Mixed hyperlipidemia 10/08/2011  . AR (allergic rhinitis) 12/20/2010  . Restless legs 12/20/2010  . Obesity (BMI 30-39.9) 12/17/2010  . Osteoarthritis of multiple joints 12/17/2010   Past Medical History:  Diagnosis Date  . Arthritis   . COVID-19 08/28/2020  . Depression   . Facial twitching   . Family history of adverse reaction to anesthesia    mother and daughter have severe PONV  . Hyperlipidemia   . Hypertension   . Hypothyroidism   . Lumbar spinal stenosis 07/08/2013  . OSA on CPAP 10/26/2018  . Renal mass     Family History  Problem Relation Age of Onset  . Arthritis Mother   . Atrial fibrillation Mother   . Hyperlipidemia Mother   . Hypertension Mother   . Leukemia Father   . Heart disease Father   . Arthritis Sister   . Arthritis Brother   . Diabetes Brother   . Hyperlipidemia Brother   . Allergic rhinitis Neg Hx   . Angioedema Neg Hx   . Asthma Neg Hx   . Eczema Neg Hx   . Immunodeficiency Neg Hx   . Urticaria Neg Hx     Past Surgical History:  Procedure Laterality Date  . GASTRIC BYPASS    . KNEE ARTHROSCOPY    . ROBOTIC ASSITED PARTIAL NEPHRECTOMY Left 09/22/2020   Procedure: XI ROBOTIC ASSITED LAPAROSCOPIC  TOTAL NEPHRECTOMY; RESECTION OF ABDOMINAL MASS;  Surgeon: Ceasar Mons, MD;  Location: [facility];  Service: Urology;  Laterality: Left;  . SPINE SURGERY    . TONSILLECTOMY    . TOTAL KNEE ARTHROPLASTY Right 04/02/2019   Procedure: TOTAL KNEE ARTHROPLASTY;  Surgeon: Sydnee Cabal, MD;  Location: [facility];  Service: Orthopedics;  Laterality: Right;  spinal plus adductor canal  . TOTAL KNEE ARTHROPLASTY Left 08/20/2019   Procedure: TOTAL KNEE ARTHROPLASTY;  Surgeon: Sydnee Cabal, MD;  Location: [facility];  Service: Orthopedics;  Laterality: Left;  with adductor canal  . TUBAL LIGATION    . UVULECTOMY    . VENTRAL HERNIA  REPAIR N/A 11/23/2020   Procedure: VENTRAL HERNIA REPAIR WITH MESH;  Surgeon: Coralie Keens, MD;  Location: [facility];  Service: General;  Laterality: N/A;  LMA   Social History   Occupational History  . Not on file  Tobacco Use  . Smoking status: Never Smoker  . Smokeless tobacco: Never Used  Vaping Use  . Vaping Use: Never used  Substance and Sexual Activity  . Alcohol use: Yes    Comment: rarely  . Drug use: No  . Sexual activity: Never    Birth control/protection: Post-menopausal

## 2020-12-14 ENCOUNTER — Ambulatory Visit (INDEPENDENT_AMBULATORY_CARE_PROVIDER_SITE_OTHER): Payer: Medicare PPO | Admitting: Family Medicine

## 2020-12-14 ENCOUNTER — Encounter: Payer: Self-pay | Admitting: Family Medicine

## 2020-12-14 ENCOUNTER — Other Ambulatory Visit: Payer: Self-pay

## 2020-12-14 VITALS — BP 124/68 | HR 74 | Temp 98.0°F | Ht 65.75 in | Wt 237.0 lb

## 2020-12-14 DIAGNOSIS — I10 Essential (primary) hypertension: Secondary | ICD-10-CM

## 2020-12-14 DIAGNOSIS — G2581 Restless legs syndrome: Secondary | ICD-10-CM

## 2020-12-14 DIAGNOSIS — E039 Hypothyroidism, unspecified: Secondary | ICD-10-CM | POA: Diagnosis not present

## 2020-12-14 DIAGNOSIS — Z0000 Encounter for general adult medical examination without abnormal findings: Secondary | ICD-10-CM

## 2020-12-14 DIAGNOSIS — Z9989 Dependence on other enabling machines and devices: Secondary | ICD-10-CM

## 2020-12-14 DIAGNOSIS — F329 Major depressive disorder, single episode, unspecified: Secondary | ICD-10-CM | POA: Diagnosis not present

## 2020-12-14 DIAGNOSIS — G4733 Obstructive sleep apnea (adult) (pediatric): Secondary | ICD-10-CM

## 2020-12-14 DIAGNOSIS — E782 Mixed hyperlipidemia: Secondary | ICD-10-CM

## 2020-12-14 DIAGNOSIS — Z85528 Personal history of other malignant neoplasm of kidney: Secondary | ICD-10-CM | POA: Diagnosis not present

## 2020-12-14 DIAGNOSIS — E538 Deficiency of other specified B group vitamins: Secondary | ICD-10-CM | POA: Diagnosis not present

## 2020-12-14 DIAGNOSIS — C649 Malignant neoplasm of unspecified kidney, except renal pelvis: Secondary | ICD-10-CM | POA: Insufficient documentation

## 2020-12-14 LAB — CBC WITH DIFFERENTIAL/PLATELET
Basophils Absolute: 0 10*3/uL (ref 0.0–0.1)
Basophils Relative: 0.3 % (ref 0.0–3.0)
Eosinophils Absolute: 0.2 10*3/uL (ref 0.0–0.7)
Eosinophils Relative: 2.8 % (ref 0.0–5.0)
HCT: 40.5 % (ref 36.0–46.0)
Hemoglobin: 13.7 g/dL (ref 12.0–15.0)
Lymphocytes Relative: 32.8 % (ref 12.0–46.0)
Lymphs Abs: 2.5 10*3/uL (ref 0.7–4.0)
MCHC: 33.9 g/dL (ref 30.0–36.0)
MCV: 86.9 fl (ref 78.0–100.0)
Monocytes Absolute: 0.7 10*3/uL (ref 0.1–1.0)
Monocytes Relative: 9 % (ref 3.0–12.0)
Neutro Abs: 4.2 10*3/uL (ref 1.4–7.7)
Neutrophils Relative %: 55.1 % (ref 43.0–77.0)
Platelets: 301 10*3/uL (ref 150.0–400.0)
RBC: 4.65 Mil/uL (ref 3.87–5.11)
RDW: 13.3 % (ref 11.5–15.5)
WBC: 7.6 10*3/uL (ref 4.0–10.5)

## 2020-12-14 LAB — COMPREHENSIVE METABOLIC PANEL
ALT: 14 U/L (ref 0–35)
AST: 16 U/L (ref 0–37)
Albumin: 4.1 g/dL (ref 3.5–5.2)
Alkaline Phosphatase: 86 U/L (ref 39–117)
BUN: 18 mg/dL (ref 6–23)
CO2: 30 mEq/L (ref 19–32)
Calcium: 10.3 mg/dL (ref 8.4–10.5)
Chloride: 103 mEq/L (ref 96–112)
Creatinine, Ser: 1.32 mg/dL — ABNORMAL HIGH (ref 0.40–1.20)
GFR: 41.73 mL/min — ABNORMAL LOW (ref >60.00)
Glucose, Bld: 106 mg/dL — ABNORMAL HIGH (ref 70–99)
Potassium: 4 mEq/L (ref 3.5–5.1)
Sodium: 140 mEq/L (ref 135–145)
Total Bilirubin: 0.5 mg/dL (ref 0.2–1.2)
Total Protein: 7.1 g/dL (ref 6.0–8.3)

## 2020-12-14 LAB — LIPID PANEL
Cholesterol: 142 mg/dL (ref 0–200)
HDL: 45.2 mg/dL (ref >39.00)
LDL Cholesterol: 63 mg/dL (ref 0–99)
NonHDL: 97.09
Total CHOL/HDL Ratio: 3
Triglycerides: 171 mg/dL — ABNORMAL HIGH (ref 0.0–149.0)
VLDL: 34.2 mg/dL (ref 0.0–40.0)

## 2020-12-14 LAB — TSH: TSH: 0.12 u[IU]/mL — ABNORMAL LOW (ref 0.35–4.50)

## 2020-12-14 LAB — VITAMIN B12: Vitamin B-12: 236 pg/mL (ref 211–911)

## 2020-12-14 MED ORDER — SHINGRIX 50 MCG/0.5ML IM SUSR: 0.5000 mL | Freq: Once | INTRAMUSCULAR | 0 refills | Status: AC

## 2020-12-14 MED ORDER — LOSARTAN POTASSIUM-HCTZ 100-25 MG PO TABS: 1.0000 | ORAL_TABLET | Freq: Every day | ORAL | Status: DC

## 2020-12-14 NOTE — Patient Instructions (Signed)
Please return in 6 months for hypertension follow up.  I will release your lab results to you on your MyChart account with further instructions. Please reply with any questions.   Please take the prescription for Shingrix to the pharmacy so they may administer the vaccinations. Your insurance will then cover the injections.   If you have any questions or concerns, please don't hesitate to send me a message via MyChart or call the office at [phone]. Thank you for visiting with Veronica Finley today! It's our pleasure caring for you.  Best of luck on your hip surgery!

## 2020-12-14 NOTE — Progress Notes (Signed)
Subjective  Chief Complaint  Patient presents with  . Annual Exam    HPI: Veronica Finley is a 70 y.o. female who presents to [facility] today for a Female Wellness Visit. She also has the concerns and/or needs as listed above in the chief complaint. These will be addressed in addition to the Health Maintenance Visit.   Wellness Visit: annual visit with health maintenance review and exam without Pap   Health maintenance: Cancer screenings are all up-to-date and normal.  Eligible for Shingrix vaccination.  Other vaccines are up-to-date.  Getting her booster Covid vaccine tomorrow. Chronic disease f/u and/or acute problem visit: (deemed necessary to be done in addition to the wellness visit):  Bilateral hip arthritis: To schedule left hip replacement soon.  Daily pain and mobility issues are getting the best of her.  Having to be sedentary.  Unable to exercise.  Energy is down and weight is up.  Hyperlipidemia, hypothyroidism, hypertension and sleep apnea: On medications and CPAP for age.  Tolerating all medications well.  No chest pain or shortness of breath.  No lower extremity edema.  Nonfasting for lab work today: Had cup of coffee with creamer.  No symptoms of hyperglycemia.  No palpitations, tremor  Depression remains well controlled on SSRI.  In spite of stressors and chronic pain, mood is good.  She is retired.  Restless leg syndrome on maximum dose ropinirole.  Intermittently has bad nights.  History of renal cell carcinoma status post nephrectomy.  B12 deficiency: Due for recheck.  She does not take B12 supplements regularly.  Assessment  1. Annual physical exam   2. Mixed hyperlipidemia   3. Acquired hypothyroidism   4. Essential hypertension   5. Vitamin B12 deficiency   6. OSA on CPAP   7. Major depression, chronic   8. History of renal cell carcinoma   9. Restless legs      Plan  Female Wellness Visit:  Age appropriate Health  Maintenance and Prevention measures were discussed with patient. Included topics are cancer screening recommendations, ways to keep healthy (see AVS) including dietary and exercise recommendations, regular eye and dental care, use of seat belts, and avoidance of moderate alcohol use and tobacco use.  Screens are up-to-date  BMI: discussed patient's BMI and encouraged positive lifestyle modifications to help get to or maintain a target BMI.  HM needs and immunizations were addressed and ordered. See below for orders. See HM and immunization section for updates.  Patient had Covid booster and Shingrix  Routine labs and screening tests ordered including cmp, cbc and lipids where appropriate.  Discussed recommendations regarding Vit D and calcium supplementation (see AVS)  Chronic disease management visit and/or acute problem visit:  Hyperlipidemia: Recheck lipid panel today.  Nonfasting.  On statin.  Check LFTs.  Hypertension is well controlled on Cardizem.  Check renal function.  Hypothyroidism has been well controlled.  On high-dose supplement.  Recheck today.  Restless leg syndrome: On ropinirole.  Continue current dose.  May need to switch to a different medication if worsens.  Check B12 levels  Osteoarthritis for hip replacement, medically cleared.  History of renal cell carcinoma: Monitor renal function   Follow up: 6 months for hypertension recheck Orders Placed This Encounter  Procedures  . CBC with Differential/Platelet  . Comprehensive metabolic panel  . Lipid panel  . TSH  . Vitamin B12   Meds ordered this encounter  Medications  . Zoster Vaccine Adjuvanted [facility]) injection  Sig: Inject 0.5 mLs into the muscle once for 1 dose. Please give 2nd dose 2-6 months after first dose    Dispense:  2 each    Refill:  0  . losartan-hydrochlorothiazide (HYZAAR) 100-25 MG tablet    Sig: Take 1 tablet by mouth daily.      Body mass index is 39.44 kg/m. Wt Readings  from Last 3 Encounters:  12/14/20 237 lb (107.5 kg)  12/11/20 243 lb (110.2 kg)  11/23/20 238 lb 12.1 oz (108.3 kg)     Patient Active Problem List   Diagnosis Date Noted  . History of renal cell carcinoma 12/14/2020    Priority: High  . Status post nephrectomy 10/24/2020    Priority: High  . OSA on CPAP 10/26/2018    Priority: High  . Hx of laparoscopic gastric banding+ truncal vagotomy 03/24/2006 01/10/2014    Priority: High  . Spinal stenosis of lumbar region 07/08/2013    Priority: High  . Major depression, chronic 07/08/2013    Priority: High  . Essential hypertension 04/06/2012    Priority: High  . Acquired hypothyroidism 10/08/2011    Priority: High  . Mixed hyperlipidemia 10/08/2011    Priority: High  . Obesity (BMI 30-39.9) 12/17/2010    Priority: High    Overview:  Obesity [facility]   . S/P knee replacement 04/02/2019    Priority: Medium  . Restless legs 12/20/2010    Priority: Medium  . Osteoarthritis of multiple joints 12/17/2010    Priority: Medium  . Vitamin B12 deficiency 06/02/2020    Priority: Low  . Contact dermatitis due to chemicals 01/25/2019    Priority: Low  . Generalized hyperhidrosis 07/15/2016    Priority: Low  . AR (allergic rhinitis) 12/20/2010    Priority: Low  . Unilateral primary osteoarthritis, left hip 12/11/2020  . Unilateral primary osteoarthritis, right hip 12/11/2020   Health Maintenance  Topic Date Due  . COVID-19 Vaccine (3 - Pfizer risk 4-dose series) 03/16/2020  . PNA vac Low Risk Adult (2 of 2 - PPSV23) 05/17/2021  . MAMMOGRAM  05/31/2021  . DEXA SCAN  11/21/2023  . COLONOSCOPY (Pts 45-9yrs Insurance coverage will need to be confirmed)  03/03/2025  . TETANUS/TDAP  07/21/2028  . INFLUENZA VACCINE  Completed  . Hepatitis C Screening  Completed   Immunization History  Administered Date(s) Administered  . Fluad Quad(high Dose 65+) 09/23/2020  . Influenza Inj Mdck Quad Pf 12/15/2014, 01/15/2016  .  Influenza, High Dose Seasonal PF 07/21/2018  . Influenza, Seasonal, Injecte, Preservative Fre 11/19/2007  . Influenza,inj,Quad PF,6+ Mos 10/20/2017  . Influenza-Unspecified 10/08/2011  . PFIZER(Purple Top)SARS-COV-2 Vaccination 01/27/2020, 02/17/2020  . PPD Test 11/19/2007  . Pneumococcal Conjugate-13 05/17/2020  . Tdap 11/19/2007, 07/21/2018  . Zoster 07/11/2014   We updated and reviewed the patient's past history in detail and it is documented below. Allergies: Patient has No Known Allergies. Past Medical History Patient  has a past medical history of Arthritis, COVID-19 (08/28/2020), Depression, Facial twitching, Family history of adverse reaction to anesthesia, Hyperlipidemia, Hypertension, Hypothyroidism, Lumbar spinal stenosis (07/08/2013), OSA on CPAP (10/26/2018), and Renal mass. Past Surgical History Patient  has a past surgical history that includes Knee arthroscopy; Tubal ligation; Gastric bypass; Spine surgery; Uvulectomy; Total knee arthroplasty (Right, 04/02/2019); Tonsillectomy; Total knee arthroplasty (Left, 08/20/2019); Robotic assited partial nephrectomy (Left, 09/22/2020); and Ventral hernia repair (N/A, 11/23/2020). Family History: Patient family history includes Arthritis in her brother, mother, and sister; Atrial fibrillation in her mother; Diabetes in her  brother; Heart disease in her father; Hyperlipidemia in her brother and mother; Hypertension in her mother; Leukemia in her father. Social History:  Patient  reports that she has never smoked. She has never used smokeless tobacco. She reports current alcohol use. She reports that she does not use drugs.  Review of Systems: Constitutional: negative for fever or malaise Ophthalmic: negative for photophobia, double vision or loss of vision Cardiovascular: negative for chest pain, dyspnea on exertion, or new LE swelling Respiratory: negative for SOB or persistent cough Gastrointestinal: negative for abdominal pain, change in  bowel habits or melena Genitourinary: negative for dysuria or gross hematuria, no abnormal uterine bleeding or disharge Musculoskeletal: Bilateral hip pain gait disturbance Integumentary: negative for new or persistent rashes, no breast lumps Neurological: negative for TIA or stroke symptoms Psychiatric: negative for SI or delusions Allergic/Immunologic: negative for hives  Patient Care Team    Relationship Specialty Notifications Start End  Leamon Arnt, MD PCP - General Family Medicine  10/16/17   Sydnee Cabal, MD Consulting Physician Orthopedic Surgery  02/22/20     Objective  Vitals: BP 124/68 (BP Location: Left Arm, Patient Position: Sitting, Cuff Size: Large)   Pulse 74   Temp 98 F (36.7 C) (Temporal)   Ht 5\' 5"  (1.651 m)   Wt 237 lb (107.5 kg)   SpO2 95%   BMI 39.44 kg/m  General:  Well developed, well nourished, no acute distress  Psych:  Alert and orientedx3,normal mood and affect HEENT:  Normocephalic, atraumatic, non-icteric sclera,  supple neck without adenopathy, mass or thyromegaly Cardiovascular:  Normal S1, S2, RRR without gallop, rub or murmur Respiratory:  Good breath sounds bilaterally, CTAB with normal respiratory effort Gastrointestinal: normal bowel sounds, soft, non-tender, no noted masses. No HSM, healing umbilical hernia scar MSK: no deformities, contusions.  Abnormal gait due to hip arthritis Skin:  Warm, no rashes or suspicious lesions noted Neurologic:    Mental status is normal. CN 2-11 are normal. Gross motor and sensory exams are normal.No tremor Breast Exam: No mass, skin retraction or nipple discharge is appreciated in either breast. No axillary adenopathy. Fibrocystic changes are not noted    Commons side effects, risks, benefits, and alternatives for medications and treatment plan prescribed today were discussed, and the patient expressed understanding of the given instructions. Patient is instructed to call or message via MyChart if he/she  has any questions or concerns regarding our treatment plan. No barriers to understanding were identified. We discussed Red Flag symptoms and signs in detail. Patient expressed understanding regarding what to do in case of urgent or emergency type symptoms.   Medication list was reconciled, printed and provided to the patient in AVS. Patient instructions and summary information was reviewed with the patient as documented in the AVS. This note was prepared with assistance of Dragon voice recognition software. Occasional wrong-word or sound-a-like substitutions may have occurred due to the inherent limitations of voice recognition software  This visit occurred during the SARS-CoV-2 public health emergency.  Safety protocols were in place, including screening questions prior to the visit, additional usage of staff PPE, and extensive cleaning of exam room while observing appropriate contact time as indicated for disinfecting solutions.

## 2020-12-19 ENCOUNTER — Other Ambulatory Visit: Payer: Self-pay

## 2020-12-19 DIAGNOSIS — R7989 Other specified abnormal findings of blood chemistry: Secondary | ICD-10-CM

## 2020-12-19 DIAGNOSIS — E039 Hypothyroidism, unspecified: Secondary | ICD-10-CM

## 2021-01-08 ENCOUNTER — Other Ambulatory Visit: Payer: Medicare PPO

## 2021-01-09 ENCOUNTER — Other Ambulatory Visit (INDEPENDENT_AMBULATORY_CARE_PROVIDER_SITE_OTHER): Payer: Medicare PPO

## 2021-01-09 ENCOUNTER — Other Ambulatory Visit: Payer: Self-pay

## 2021-01-09 DIAGNOSIS — E1065 Type 1 diabetes mellitus with hyperglycemia: Secondary | ICD-10-CM

## 2021-01-09 DIAGNOSIS — R7989 Other specified abnormal findings of blood chemistry: Secondary | ICD-10-CM

## 2021-01-09 LAB — TSH: TSH: 0.16 u[IU]/mL — ABNORMAL LOW (ref 0.35–4.50)

## 2021-01-09 LAB — HEMOGLOBIN A1C: Hgb A1c MFr Bld: 6 % (ref 4.6–6.5)

## 2021-01-10 ENCOUNTER — Other Ambulatory Visit: Payer: Self-pay

## 2021-01-10 MED ORDER — LEVOTHYROXINE SODIUM 150 MCG PO TABS: 150.0000 ug | ORAL_TABLET | Freq: Every day | ORAL | 3 refills | Status: DC

## 2021-01-15 ENCOUNTER — Telehealth: Payer: Self-pay

## 2021-01-15 NOTE — Telephone Encounter (Signed)
I don't see that dos.  Thanks, [employer]

## 2021-01-15 NOTE — Telephone Encounter (Signed)
It would depend on documentation.  Was this done in our lab or sent out and if so what lab and dos, I can look.  Thanks, DAwn

## 2021-01-15 NOTE — Telephone Encounter (Signed)
Our lab on 2/7 for the A1C

## 2021-01-15 NOTE — Telephone Encounter (Signed)
Sorry 2/22!

## 2021-01-15 NOTE — Telephone Encounter (Signed)
Discussed all concerns with patient. Is there anyway we can change a dx code on labs that have already been ordered and processed?

## 2021-01-15 NOTE — Telephone Encounter (Signed)
Patient is calling in stating she got her lab results back last week and did not understand what it all meant, asked for a call back to go over everything.

## 2021-01-15 NOTE — Telephone Encounter (Signed)
I can send request to have corrected claim sent to insurance, not sure how you would change on order, is DM 1 not correct?  And if not, what should dx be?  You may have to ask IT for help if order needs changed. Thanks, [employer]

## 2021-01-24 NOTE — Telephone Encounter (Signed)
I am sending to charge correction and CC'ing you.  Patient did not pay anything on this test but insurance did, so they will refund insurance.  Thanks, [employer]

## 2021-01-24 NOTE — Telephone Encounter (Signed)
After looking further into this, A1C should not have been ordered in the first place. I am not sure how to go about cancelling this or crediting patient.. Please help!

## 2021-01-25 DIAGNOSIS — Z85528 Personal history of other malignant neoplasm of kidney: Secondary | ICD-10-CM | POA: Diagnosis not present

## 2021-01-26 NOTE — Patient Instructions (Addendum)
DUE TO COVID-19 ONLY ONE VISITOR IS ALLOWED TO COME WITH YOU AND STAY IN THE WAITING ROOM ONLY DURING PRE OP AND PROCEDURE DAY OF SURGERY. THE 1 VISITOR  MAY VISIT WITH YOU AFTER SURGERY IN YOUR PRIVATE ROOM DURING VISITING HOURS ONLY!  YOU NEED TO HAVE A COVID 19 TEST ON   3/22__ @__10 :05_____, THIS TEST MUST BE DONE BEFORE SURGERY,  COVID TESTING SITE [place] [address], IT IS ON THE RIGHT GOING OUT [address] APPROXIMATELY  2 MINUTES PAST [employer] ON THE RIGHT. ONCE YOUR COVID TEST IS COMPLETED,  PLEASE BEGIN THE QUARANTINE INSTRUCTIONS AS OUTLINED IN YOUR HANDOUT.                Veronica Finley   Your procedure is scheduled on: 02/09/21   Report to [facility] Main  Entrance   Report to admitting at   7:15 AM     Call this number if you have problems the morning of surgery [place], NO CHEWING GUM [name]   No food after midnight.    You may have clear liquid until 6:30 AM.    At 6:00 AM drink pre surgery drink.   Nothing by mouth after 6:30 AM.    Take these medicines the morning of surgery with A SIP OF WATER: Prozac, Requip, Diltiazem                                 You may not have any metal on your body including hair pins and              piercings  Do not wear jewelry, make-up, lotions, powders or perfumes, deodorant             Do not wear nail polish on your fingernails.  Do not shave  48 hours prior to surgery.                 Do not bring valuables to the hospital. [place].  Contacts, dentures or bridgework may not be worn into surgery.       Special Instructions: N/A              Please read over the following fact sheets you were given: _____________________________________________________________________             [facility] - Preparing for Surgery Before surgery, you  can play an important role.  Because skin is not sterile, your skin needs to be as free of germs as possible.  You can reduce the number of germs on your skin by washing with CHG (chlorahexidine gluconate) soap before surgery.  CHG is an antiseptic cleaner which kills germs and bonds with the skin to continue killing germs even after washing. Please DO NOT use if you have an allergy to CHG or antibacterial soaps.  If your skin becomes reddened/irritated stop using the CHG and inform your nurse when you arrive at Short Stay. Do not shave (including legs and underarms) for at least 48 hours prior to the first CHG shower.   Please follow these instructions carefully:  1.  Shower with CHG Soap the night before surgery and the  morning of Surgery.  2.  If you choose to wash your hair, wash your hair first as usual with your  normal  shampoo.  3.  After you shampoo, rinse your hair and body thoroughly to remove the  shampoo.                                        4.  Use CHG as you would any other liquid soap.  You can apply chg directly  to the skin and wash                       Gently with a scrungie or clean washcloth.  5.  Apply the CHG Soap to your body ONLY FROM THE NECK DOWN.   Do not use on face/ open                           Wound or open sores. Avoid contact with eyes, ears mouth and genitals (private parts).                       Wash face,  Genitals (private parts) with your normal soap.             6.  Wash thoroughly, paying special attention to the area where your surgery  will be performed.  7.  Thoroughly rinse your body with warm water from the neck down.  8.  DO NOT shower/wash with your normal soap after using and rinsing off  the CHG Soap.             9.  Pat yourself dry with a clean towel.            10.  Wear clean pajamas.            11.  Place clean sheets on your bed the night of your first shower and do not  sleep with pets. Day of Surgery : Do not apply any  lotions/deodorants the morning of surgery.  Please wear clean clothes to the hospital/surgery center.  FAILURE TO FOLLOW THESE INSTRUCTIONS MAY RESULT IN THE CANCELLATION OF YOUR SURGERY PATIENT SIGNATURE_________________________________  NURSE SIGNATURE__________________________________  ________________________________________________________________________   [name]  An incentive spirometer is a tool that can help keep your lungs clear and active. This tool measures how well you are filling your lungs with each breath. Taking long deep breaths may help reverse or decrease the chance of developing breathing (pulmonary) problems (especially infection) following:  A long period of time when you are unable to move or be active. BEFORE THE PROCEDURE   If the spirometer includes an indicator to show your best effort, your nurse or respiratory therapist will set it to a desired goal.  If possible, sit up straight or lean slightly forward. Try not to slouch.  Hold the incentive spirometer in an upright position. INSTRUCTIONS FOR USE  1. Sit on the edge of your bed if possible, or sit up as far as you can in bed or on a chair. 2. Hold the incentive spirometer in an upright position. 3. Breathe out normally. 4. Place the mouthpiece in your mouth and seal your lips tightly around it. 5. Breathe in slowly and as deeply as possible, raising the piston or the ball toward the top of the column. 6. Hold your breath for 3-5 seconds or for  as long as possible. Allow the piston or ball to fall to the bottom of the column. 7. Remove the mouthpiece from your mouth and breathe out normally. 8. Rest for a few seconds and repeat Steps 1 through 7 at least 10 times every 1-2 hours when you are awake. Take your time and take a few normal breaths between deep breaths. 9. The spirometer may include an indicator to show your best effort. Use the indicator as a goal to work toward during each  repetition. 10. After each set of 10 deep breaths, practice coughing to be sure your lungs are clear. If you have an incision (the cut made at the time of surgery), support your incision when coughing by placing a pillow or rolled up towels firmly against it. Once you are able to get out of bed, walk around indoors and cough well. You may stop using the incentive spirometer when instructed by your caregiver.  RISKS AND COMPLICATIONS  Take your time so you do not get dizzy or light-headed.  If you are in pain, you may need to take or ask for pain medication before doing incentive spirometry. It is harder to take a deep breath if you are having pain. AFTER USE  Rest and breathe slowly and easily.  It can be helpful to keep track of a log of your progress. Your caregiver can provide you with a simple table to help with this. If you are using the spirometer at home, follow these instructions: [place] IF:   You are having difficultly using the spirometer.  You have trouble using the spirometer as often as instructed.  Your pain medication is not giving enough relief while using the spirometer.  You develop fever of 100.5 F (38.1 C) or higher. SEEK IMMEDIATE MEDICAL CARE IF:   You cough up bloody sputum that had not been present before.  You develop fever of 102 F (38.9 C) or greater.  You develop worsening pain at or near the incision site. MAKE SURE YOU:   Understand these instructions.  Will watch your condition.  Will get help right away if you are not doing well or get worse. Document Released: 03/17/2007 Document Revised: 01/27/2012 Document Reviewed: 05/18/2007 [facility] Patient Information 2014 [place], [place].   ________________________________________________________________________

## 2021-01-29 ENCOUNTER — Encounter (HOSPITAL_COMMUNITY): Payer: Self-pay

## 2021-01-29 ENCOUNTER — Ambulatory Visit (HOSPITAL_COMMUNITY)
Admission: RE | Admit: 2021-01-29 | Discharge: 2021-01-29 | Disposition: A | Discharge status: Home / Self Care | Payer: Medicare PPO | Source: Ambulatory Visit | Attending: Urology | Admitting: Urology

## 2021-01-29 ENCOUNTER — Other Ambulatory Visit (HOSPITAL_COMMUNITY): Payer: Self-pay | Admitting: Urology

## 2021-01-29 ENCOUNTER — Encounter (HOSPITAL_COMMUNITY)
Admission: RE | Admit: 2021-01-29 | Discharge: 2021-01-29 | Disposition: A | Discharge status: Home / Self Care | Payer: Medicare PPO | Source: Ambulatory Visit | Attending: Orthopaedic Surgery | Admitting: Orthopaedic Surgery

## 2021-01-29 ENCOUNTER — Other Ambulatory Visit: Payer: Self-pay

## 2021-01-29 DIAGNOSIS — K8020 Calculus of gallbladder without cholecystitis without obstruction: Secondary | ICD-10-CM | POA: Diagnosis not present

## 2021-01-29 DIAGNOSIS — C642 Malignant neoplasm of left kidney, except renal pelvis: Secondary | ICD-10-CM | POA: Diagnosis not present

## 2021-01-29 DIAGNOSIS — Z905 Acquired absence of kidney: Secondary | ICD-10-CM | POA: Diagnosis not present

## 2021-01-29 DIAGNOSIS — Z85528 Personal history of other malignant neoplasm of kidney: Secondary | ICD-10-CM

## 2021-01-29 HISTORY — DX: Prediabetes: R73.03

## 2021-01-29 LAB — CBC
HCT: 42.9 % (ref 36.0–46.0)
Hemoglobin: 14.2 g/dL (ref 12.0–15.0)
MCH: 29.8 pg (ref 26.0–34.0)
MCHC: 33.1 g/dL (ref 30.0–36.0)
MCV: 89.9 fL (ref 80.0–100.0)
Platelets: 313 10*3/uL (ref 150–400)
RBC: 4.77 MIL/uL (ref 3.87–5.11)
RDW: 13.2 % (ref 11.5–15.5)
WBC: 7.7 10*3/uL (ref 4.0–10.5)
nRBC: 0 % (ref 0.0–0.2)

## 2021-01-29 LAB — BASIC METABOLIC PANEL
Anion gap: 12 (ref 5–15)
BUN: 20 mg/dL (ref 8–23)
CO2: 23 mmol/L (ref 22–32)
Calcium: 9.8 mg/dL (ref 8.9–10.3)
Chloride: 105 mmol/L (ref 98–111)
Creatinine, Ser: 1.35 mg/dL — ABNORMAL HIGH (ref 0.44–1.00)
GFR, Estimated: 43 mL/min — ABNORMAL LOW (ref >60)
Glucose, Bld: 113 mg/dL — ABNORMAL HIGH (ref 70–99)
Potassium: 3.7 mmol/L (ref 3.5–5.1)
Sodium: 140 mmol/L (ref 135–145)

## 2021-01-29 LAB — TYPE AND SCREEN
ABO/RH(D): O POS
Antibody Screen: NEGATIVE

## 2021-01-29 LAB — SURGICAL PCR SCREEN
MRSA, PCR: NEGATIVE
Staphylococcus aureus: POSITIVE — AB

## 2021-01-29 NOTE — Progress Notes (Signed)
COVID Vaccine Completed:yes Date COVID Vaccine completed:02/17/20 booster 12/15/20 COVID vaccine manufacturer: Pfizer      PCP - Dr. Cathlean Marseilles Cardiologist - none  Chest x-ray - no EKG - 09/15/20-epic Stress Test - no ECHO - no Cardiac Cath -no  Pacemaker/ICD device last checked:NA  Sleep Study - yes CPAP - yes  Fasting Blood Sugar - NA Checks Blood Sugar _____ times a day  Blood Thinner Instructions:NA Aspirin Instructions: Last Dose:  Anesthesia review:   Patient denies shortness of breath, fever, cough and chest pain at PAT appointment yes  Patient verbalized understanding of instructions that were given to them at the PAT appointment. Patient was also instructed that they will need to review over the PAT instructions again at home before surgery.yes Pt can climb 2-3 flights of stairs, do housework and ADLs with out SOB

## 2021-01-31 ENCOUNTER — Other Ambulatory Visit: Payer: Self-pay | Admitting: Physician Assistant

## 2021-02-01 DIAGNOSIS — C642 Malignant neoplasm of left kidney, except renal pelvis: Secondary | ICD-10-CM | POA: Diagnosis not present

## 2021-02-06 ENCOUNTER — Other Ambulatory Visit (HOSPITAL_COMMUNITY)
Admission: RE | Admit: 2021-02-06 | Discharge: 2021-02-06 | Disposition: A | Discharge status: Home / Self Care | Payer: Medicare PPO | Source: Ambulatory Visit | Attending: Orthopaedic Surgery | Admitting: Orthopaedic Surgery

## 2021-02-06 DIAGNOSIS — Z01812 Encounter for preprocedural laboratory examination: Secondary | ICD-10-CM | POA: Diagnosis not present

## 2021-02-06 DIAGNOSIS — Z20822 Contact with and (suspected) exposure to covid-19: Secondary | ICD-10-CM | POA: Insufficient documentation

## 2021-02-06 LAB — SARS CORONAVIRUS 2 (TAT 6-24 HRS): SARS Coronavirus 2: NEGATIVE

## 2021-02-08 ENCOUNTER — Telehealth: Payer: Self-pay | Admitting: *Deleted

## 2021-02-08 NOTE — Care Plan (Signed)
RNCM call to patient to discuss her upcoming Left total hip replacement with Dr. Ninfa Linden on 02/09/21 at [facility]. She is an Ortho bundle patient through [facility] and is agreeable to case management. She has family at home that will be assisting her after surgery. She has a FWW and elevated toilets in her home. (Declined 3in1/BSC, but would like one if she feels needs it after surgery). Anticipate HHPT will be needed after short hospital stay. Choice provided and referral made to [facility] for PT (Former[facility]). F/U with MD scheduled for 2 weeks post-op. Reviewed all post-op care instructions. Will follow for needs.

## 2021-02-08 NOTE — Telephone Encounter (Signed)
Ortho bundle pre-op call completed. 

## 2021-02-08 NOTE — Anesthesia Preprocedure Evaluation (Addendum)
Anesthesia Evaluation  Patient identified by MRN, date of birth, ID band Patient awake    Reviewed: Allergy & Precautions, NPO status , Patient's Chart, lab work & pertinent test results  Airway Mallampati: III  TM Distance: >3 FB Neck ROM: Full    Dental no notable dental hx. (+) Dental Advisory Given   Pulmonary sleep apnea and Continuous Positive Airway Pressure Ventilation ,    Pulmonary exam normal breath sounds clear to auscultation       Cardiovascular hypertension, Pt. on medications Normal cardiovascular exam Rhythm:Regular Rate:Normal     Neuro/Psych PSYCHIATRIC DISORDERS Depression negative neurological ROS     GI/Hepatic Neg liver ROS, Hx lap banding   Endo/Other  Hypothyroidism Obesity BMI 36  Renal/GU Renal disease (renal mass s/p partial nephrectomy 09/2020)Cr 1.35  negative genitourinary   Musculoskeletal  (+) Arthritis , Osteoarthritis,  OA L hip Lumbar spinal stenosis- 1 prior back surgery    Abdominal   Peds  Hematology negative hematology ROS (+)   Anesthesia Other Findings   Reproductive/Obstetrics negative OB ROS                           Anesthesia Physical Anesthesia Plan  ASA: III  Anesthesia Plan: MAC and Spinal   Post-op Pain Management:    Induction:   PONV Risk Score and Plan: Propofol infusion, TIVA and Treatment may vary due to age or medical condition  Airway Management Planned: Natural Airway and Simple Face Mask  Additional Equipment: None  Intra-op Plan:   Post-operative Plan:   Informed Consent: I have reviewed the patients History and Physical, chart, labs and discussed the procedure including the risks, benefits and alternatives for the proposed anesthesia with the patient or authorized representative who has indicated his/her understanding and acceptance.       Plan Discussed with: CRNA  Anesthesia Plan Comments:         Anesthesia Quick Evaluation

## 2021-02-08 NOTE — H&P (Signed)
TOTAL HIP ADMISSION H&P  Patient is admitted for left total hip arthroplasty.  Subjective:  Chief Complaint: left hip pain  HPI: Veronica Finley, 70 y.o. female, has a history of pain and functional disability in the left hip(s) due to arthritis and patient has failed non-surgical conservative treatments for greater than 12 weeks to include NSAID's and/or analgesics, corticosteriod injections, flexibility and strengthening excercises, use of assistive devices, weight reduction as appropriate and activity modification.  Onset of symptoms was gradual starting 2 years ago with gradually worsening course since that time.The patient noted no past surgery on the left hip(s).  Patient currently rates pain in the left hip at 10 out of 10 with activity. Patient has night pain, worsening of pain with activity and weight bearing, trendelenberg gait and pain that interfers with activities of daily living. Patient has evidence of subchondral cysts, subchondral sclerosis, periarticular osteophytes and joint space narrowing by imaging studies. This condition presents safety issues increasing the risk of falls.  There is no current active infection.  Patient Active Problem List   Diagnosis Date Noted  . History of renal cell carcinoma 12/14/2020  . Unilateral primary osteoarthritis, left hip 12/11/2020  . Unilateral primary osteoarthritis, right hip 12/11/2020  . Status post nephrectomy 10/24/2020  . Vitamin B12 deficiency 06/02/2020  . S/P knee replacement 04/02/2019  . Contact dermatitis due to chemicals 01/25/2019  . OSA on CPAP 10/26/2018  . Generalized hyperhidrosis 07/15/2016  . Hx of laparoscopic gastric banding+ truncal vagotomy 03/24/2006 01/10/2014  . Spinal stenosis of lumbar region 07/08/2013  . Major depression, chronic 07/08/2013  . Essential hypertension 04/06/2012  . Acquired hypothyroidism 10/08/2011  . Mixed hyperlipidemia 10/08/2011  . AR (allergic rhinitis) 12/20/2010  . Restless  legs 12/20/2010  . Obesity (BMI 30-39.9) 12/17/2010  . Osteoarthritis of multiple joints 12/17/2010   Past Medical History:  Diagnosis Date  . Arthritis   . COVID-19 08/28/2020  . Depression   . Family history of adverse reaction to anesthesia    mother and daughter have severe PONV  . Hyperlipidemia   . Hypertension   . Hypothyroidism   . Lumbar spinal stenosis 07/08/2013  . OSA on CPAP 10/26/2018  . Pre-diabetes   . Renal mass 09/2020    Past Surgical History:  Procedure Laterality Date  . GASTRIC BYPASS    . KNEE ARTHROSCOPY    . ROBOTIC ASSITED PARTIAL NEPHRECTOMY Left 09/22/2020   Procedure: XI ROBOTIC ASSITED LAPAROSCOPIC  TOTAL NEPHRECTOMY; RESECTION OF ABDOMINAL MASS;  Surgeon: Ceasar Mons, MD;  Location: [facility];  Service: Urology;  Laterality: Left;  . SPINE SURGERY    . TONSILLECTOMY    . TOTAL KNEE ARTHROPLASTY Right 04/02/2019   Procedure: TOTAL KNEE ARTHROPLASTY;  Surgeon: Sydnee Cabal, MD;  Location: [facility];  Service: Orthopedics;  Laterality: Right;  spinal plus adductor canal  . TOTAL KNEE ARTHROPLASTY Left 08/20/2019   Procedure: TOTAL KNEE ARTHROPLASTY;  Surgeon: Sydnee Cabal, MD;  Location: [facility];  Service: Orthopedics;  Laterality: Left;  with adductor canal  . TUBAL LIGATION    . UVULECTOMY    . VENTRAL HERNIA REPAIR N/A 11/23/2020   Procedure: VENTRAL HERNIA REPAIR WITH MESH;  Surgeon: Coralie Keens, MD;  Location: [place];  Service: General;  Laterality: N/A;  LMA    No current facility-administered medications for this encounter.   Current Outpatient Medications  Medication Sig Dispense Refill Last Dose  . acetaminophen (TYLENOL) 500 MG tablet Take 1,000 mg by mouth every  6 (six) hours as needed for moderate pain.     . cholecalciferol (VITAMIN D3) 25 MCG (1000 UNIT) tablet Take 1,000 Units by mouth daily.     . diclofenac Sodium (VOLTAREN) 1 % GEL Apply 2 g topically daily as needed (hip pain).     Marland Kitchen diltiazem  (CARDIZEM CD) 240 MG 24 hr capsule Take 1 capsule (240 mg total) by mouth daily. 90 capsule 3   . docusate sodium (COLACE) 100 MG capsule Take 1 capsule (100 mg total) by mouth 2 (two) times daily. (Patient taking differently: Take 100 mg by mouth daily.)     . FLUoxetine (PROZAC) 20 MG capsule Take 1 capsule (20 mg total) by mouth daily. Needs office visit 90 capsule 3   . levothyroxine (SYNTHROID) 150 MCG tablet Take 1 tablet (150 mcg total) by mouth daily. 90 tablet 3   . losartan-hydrochlorothiazide (HYZAAR) 100-25 MG tablet Take 1 tablet by mouth daily.     Marland Kitchen rOPINIRole (REQUIP XL) 4 MG 24 hr tablet Take 1 tablet (4 mg total) by mouth at bedtime. 90 tablet 3   . rosuvastatin (CRESTOR) 10 MG tablet Take 1 tablet (10 mg total) by mouth daily. 90 tablet 3   . traMADol (ULTRAM) 50 MG tablet Take 1-2 tablets (50-100 mg total) by mouth every 6 (six) hours as needed for moderate pain or severe pain. 20 tablet 0   . vitamin B-12 (CYANOCOBALAMIN) 1000 MCG tablet Take 1,000 mcg by mouth daily.      No Known Allergies  Social History   Tobacco Use  . Smoking status: Never Smoker  . Smokeless tobacco: Never Used  Substance Use Topics  . Alcohol use: Yes    Comment: rarely    Family History  Problem Relation Age of Onset  . Arthritis Mother   . Atrial fibrillation Mother   . Hyperlipidemia Mother   . Hypertension Mother   . Leukemia Father   . Heart disease Father   . Arthritis Sister   . Arthritis Brother   . Diabetes Brother   . Hyperlipidemia Brother   . Allergic rhinitis Neg Hx   . Angioedema Neg Hx   . Asthma Neg Hx   . Eczema Neg Hx   . Immunodeficiency Neg Hx   . Urticaria Neg Hx      Review of Systems  Musculoskeletal: Positive for gait problem.  All other systems reviewed and are negative.   Objective:  Physical Exam Vitals reviewed.  Constitutional:      Appearance: Normal appearance.  HENT:     Head: Normocephalic and atraumatic.  Eyes:     Extraocular  Movements: Extraocular movements intact.     Pupils: Pupils are equal, round, and reactive to light.  Cardiovascular:     Rate and Rhythm: Normal rate and regular rhythm.     Pulses: Normal pulses.  Pulmonary:     Effort: Pulmonary effort is normal.  Abdominal:     Palpations: Abdomen is soft.  Musculoskeletal:     Cervical back: Normal range of motion.     Left hip: Tenderness and bony tenderness present. Decreased range of motion.  Neurological:     Mental Status: She is alert and oriented to person, place, and time.  Psychiatric:        Behavior: Behavior normal.     Vital signs in last 24 hours:    Labs:   Estimated body mass index is 36.3 kg/m as calculated from the following:   Height as  of 01/29/21: 5' 7.75" (1.721 m).   Weight as of 12/14/20: 107.5 kg.   Imaging Review Plain radiographs demonstrate severe degenerative joint disease of the left hip(s). The bone quality appears to be good for age and reported activity level.      Assessment/Plan:  End stage arthritis, left hip(s)  The patient history, physical examination, clinical judgement of the provider and imaging studies are consistent with end stage degenerative joint disease of the left hip(s) and total hip arthroplasty is deemed medically necessary. The treatment options including medical management, injection therapy, arthroscopy and arthroplasty were discussed at length. The risks and benefits of total hip arthroplasty were presented and reviewed. The risks due to aseptic loosening, infection, stiffness, dislocation/subluxation,  thromboembolic complications and other imponderables were discussed.  The patient acknowledged the explanation, agreed to proceed with the plan and consent was signed. Patient is being admitted for inpatient treatment for surgery, pain control, PT, OT, prophylactic antibiotics, VTE prophylaxis, progressive ambulation and ADL's and discharge planning.The patient is planning to be  discharged home with home health services

## 2021-02-09 ENCOUNTER — Encounter (HOSPITAL_COMMUNITY)
Admission: RE | Disposition: A | Discharge status: Home Health | Payer: Self-pay | Source: Home / Self Care | Attending: Orthopaedic Surgery

## 2021-02-09 ENCOUNTER — Ambulatory Visit (HOSPITAL_COMMUNITY): Payer: Medicare PPO | Admitting: Anesthesiology

## 2021-02-09 ENCOUNTER — Other Ambulatory Visit: Payer: Self-pay

## 2021-02-09 ENCOUNTER — Inpatient Hospital Stay (HOSPITAL_COMMUNITY)
Admission: RE | Admit: 2021-02-09 | Discharge: 2021-02-11 | DRG: 470 | Disposition: A | Discharge status: Home Health | Payer: Medicare PPO | Attending: Orthopaedic Surgery | Admitting: Orthopaedic Surgery

## 2021-02-09 ENCOUNTER — Encounter (HOSPITAL_COMMUNITY): Payer: Self-pay | Admitting: Orthopaedic Surgery

## 2021-02-09 ENCOUNTER — Observation Stay (HOSPITAL_COMMUNITY): Payer: Medicare PPO

## 2021-02-09 ENCOUNTER — Ambulatory Visit (HOSPITAL_COMMUNITY): Payer: Medicare PPO

## 2021-02-09 DIAGNOSIS — G4733 Obstructive sleep apnea (adult) (pediatric): Secondary | ICD-10-CM | POA: Diagnosis present

## 2021-02-09 DIAGNOSIS — Z79890 Hormone replacement therapy (postmenopausal): Secondary | ICD-10-CM

## 2021-02-09 DIAGNOSIS — M1612 Unilateral primary osteoarthritis, left hip: Secondary | ICD-10-CM | POA: Diagnosis present

## 2021-02-09 DIAGNOSIS — E782 Mixed hyperlipidemia: Secondary | ICD-10-CM | POA: Diagnosis not present

## 2021-02-09 DIAGNOSIS — Z905 Acquired absence of kidney: Secondary | ICD-10-CM

## 2021-02-09 DIAGNOSIS — I10 Essential (primary) hypertension: Secondary | ICD-10-CM | POA: Diagnosis present

## 2021-02-09 DIAGNOSIS — G2581 Restless legs syndrome: Secondary | ICD-10-CM | POA: Diagnosis present

## 2021-02-09 DIAGNOSIS — Z419 Encounter for procedure for purposes other than remedying health state, unspecified: Secondary | ICD-10-CM

## 2021-02-09 DIAGNOSIS — Z85528 Personal history of other malignant neoplasm of kidney: Secondary | ICD-10-CM | POA: Diagnosis not present

## 2021-02-09 DIAGNOSIS — R7303 Prediabetes: Secondary | ICD-10-CM | POA: Diagnosis present

## 2021-02-09 DIAGNOSIS — Z96642 Presence of left artificial hip joint: Secondary | ICD-10-CM | POA: Diagnosis not present

## 2021-02-09 DIAGNOSIS — Z9989 Dependence on other enabling machines and devices: Secondary | ICD-10-CM | POA: Diagnosis not present

## 2021-02-09 DIAGNOSIS — R531 Weakness: Secondary | ICD-10-CM | POA: Diagnosis not present

## 2021-02-09 DIAGNOSIS — Z96653 Presence of artificial knee joint, bilateral: Secondary | ICD-10-CM | POA: Diagnosis not present

## 2021-02-09 DIAGNOSIS — Z6836 Body mass index (BMI) 36.0-36.9, adult: Secondary | ICD-10-CM | POA: Diagnosis not present

## 2021-02-09 DIAGNOSIS — Z8616 Personal history of COVID-19: Secondary | ICD-10-CM

## 2021-02-09 DIAGNOSIS — E538 Deficiency of other specified B group vitamins: Secondary | ICD-10-CM | POA: Diagnosis present

## 2021-02-09 DIAGNOSIS — E669 Obesity, unspecified: Secondary | ICD-10-CM | POA: Diagnosis present

## 2021-02-09 DIAGNOSIS — Z79899 Other long term (current) drug therapy: Secondary | ICD-10-CM | POA: Diagnosis not present

## 2021-02-09 DIAGNOSIS — Z471 Aftercare following joint replacement surgery: Secondary | ICD-10-CM | POA: Diagnosis not present

## 2021-02-09 DIAGNOSIS — E039 Hypothyroidism, unspecified: Secondary | ICD-10-CM | POA: Diagnosis not present

## 2021-02-09 DIAGNOSIS — Z9884 Bariatric surgery status: Secondary | ICD-10-CM

## 2021-02-09 HISTORY — PX: TOTAL HIP ARTHROPLASTY: SHX124

## 2021-02-09 SURGERY — ARTHROPLASTY, HIP, TOTAL, ANTERIOR APPROACH
Anesthesia: Monitor Anesthesia Care | Site: Hip | Laterality: Left

## 2021-02-09 MED ORDER — FENTANYL CITRATE (PF) 250 MCG/5ML IJ SOLN
INTRAMUSCULAR | Status: AC
  Filled 2021-02-09: qty 5

## 2021-02-09 MED ORDER — ACETAMINOPHEN 500 MG PO TABS
ORAL_TABLET | ORAL | Status: AC
  Filled 2021-02-09: qty 2

## 2021-02-09 MED ORDER — SODIUM CHLORIDE 0.9 % IV SOLN: INTRAVENOUS | Status: DC

## 2021-02-09 MED ORDER — DIPHENHYDRAMINE HCL 12.5 MG/5ML PO ELIX: 12.5000 mg | ORAL_SOLUTION | ORAL | Status: DC | PRN

## 2021-02-09 MED ORDER — FLUOXETINE HCL 20 MG PO CAPS
20.0000 mg | ORAL_CAPSULE | Freq: Every day | ORAL | Status: DC
  Administered 2021-02-10 – 2021-02-11 (×2): 20 mg via ORAL
  Filled 2021-02-09 (×2): qty 1

## 2021-02-09 MED ORDER — HYDROMORPHONE HCL 1 MG/ML IJ SOLN
0.2500 mg | INTRAMUSCULAR | Status: DC | PRN
  Administered 2021-02-09: 0.5 mg via INTRAVENOUS

## 2021-02-09 MED ORDER — LACTATED RINGERS IV SOLN: INTRAVENOUS | Status: DC

## 2021-02-09 MED ORDER — FENTANYL CITRATE (PF) 250 MCG/5ML IJ SOLN
INTRAMUSCULAR | Status: DC | PRN
  Administered 2021-02-09 (×7): 50 ug via INTRAVENOUS
  Administered 2021-02-09: 25 ug via INTRAVENOUS

## 2021-02-09 MED ORDER — METHOCARBAMOL 500 MG IVPB - SIMPLE MED
500.0000 mg | Freq: Four times a day (QID) | INTRAVENOUS | Status: DC | PRN
  Administered 2021-02-09: 500 mg via INTRAVENOUS
  Filled 2021-02-09: qty 50

## 2021-02-09 MED ORDER — HYDROCHLOROTHIAZIDE 25 MG PO TABS
25.0000 mg | ORAL_TABLET | Freq: Every day | ORAL | Status: DC
  Administered 2021-02-10 – 2021-02-11 (×2): 25 mg via ORAL
  Filled 2021-02-09: qty 1

## 2021-02-09 MED ORDER — OXYCODONE HCL 5 MG/5ML PO SOLN
5.0000 mg | Freq: Once | ORAL | Status: AC | PRN
Start: 2021-02-09 — End: 2021-02-09

## 2021-02-09 MED ORDER — LOSARTAN POTASSIUM 50 MG PO TABS
100.0000 mg | ORAL_TABLET | Freq: Every day | ORAL | Status: DC
  Administered 2021-02-10 – 2021-02-11 (×2): 100 mg via ORAL
  Filled 2021-02-09 (×2): qty 2

## 2021-02-09 MED ORDER — METHOCARBAMOL 500 MG PO TABS
500.0000 mg | ORAL_TABLET | Freq: Four times a day (QID) | ORAL | Status: DC | PRN
  Administered 2021-02-09 – 2021-02-11 (×4): 500 mg via ORAL
  Filled 2021-02-09 (×4): qty 1

## 2021-02-09 MED ORDER — ONDANSETRON HCL 4 MG/2ML IJ SOLN: 4.0000 mg | Freq: Four times a day (QID) | INTRAMUSCULAR | Status: DC | PRN

## 2021-02-09 MED ORDER — PROPOFOL 10 MG/ML IV BOLUS
INTRAVENOUS | Status: AC
  Filled 2021-02-09: qty 20

## 2021-02-09 MED ORDER — OXYCODONE HCL 5 MG PO TABS: 5.0000 mg | ORAL_TABLET | Freq: Once | ORAL | Status: DC | PRN

## 2021-02-09 MED ORDER — PROPOFOL 1000 MG/100ML IV EMUL
INTRAVENOUS | Status: AC
  Filled 2021-02-09: qty 200

## 2021-02-09 MED ORDER — ASPIRIN 81 MG PO CHEW
81.0000 mg | CHEWABLE_TABLET | Freq: Two times a day (BID) | ORAL | Status: DC
  Administered 2021-02-09 – 2021-02-11 (×4): 81 mg via ORAL
  Filled 2021-02-09 (×4): qty 1

## 2021-02-09 MED ORDER — DILTIAZEM HCL ER COATED BEADS 240 MG PO CP24
240.0000 mg | ORAL_CAPSULE | Freq: Every day | ORAL | Status: DC
  Administered 2021-02-10 – 2021-02-11 (×2): 240 mg via ORAL
  Filled 2021-02-09: qty 1

## 2021-02-09 MED ORDER — DEXAMETHASONE SODIUM PHOSPHATE 10 MG/ML IJ SOLN
INTRAMUSCULAR | Status: DC | PRN
  Administered 2021-02-09: 10 mg via INTRAVENOUS

## 2021-02-09 MED ORDER — PHENOL 1.4 % MT LIQD: 1.0000 | OROMUCOSAL | Status: DC | PRN

## 2021-02-09 MED ORDER — PROPOFOL 10 MG/ML IV BOLUS
INTRAVENOUS | Status: DC | PRN
  Administered 2021-02-09 (×3): 20 mg via INTRAVENOUS
  Administered 2021-02-09: 130 mg via INTRAVENOUS
  Administered 2021-02-09: 10 mg via INTRAVENOUS
  Administered 2021-02-09: 20 mg via INTRAVENOUS

## 2021-02-09 MED ORDER — LOSARTAN POTASSIUM-HCTZ 100-25 MG PO TABS: 1.0000 | ORAL_TABLET | Freq: Every day | ORAL | Status: DC

## 2021-02-09 MED ORDER — PHENYLEPHRINE HCL (PRESSORS) 10 MG/ML IV SOLN
INTRAVENOUS | Status: AC
  Filled 2021-02-09: qty 2

## 2021-02-09 MED ORDER — 0.9 % SODIUM CHLORIDE (POUR BTL) OPTIME
TOPICAL | Status: DC | PRN
  Administered 2021-02-09: 1000 mL

## 2021-02-09 MED ORDER — CEFAZOLIN SODIUM-DEXTROSE 2-4 GM/100ML-% IV SOLN
INTRAVENOUS | Status: AC
  Filled 2021-02-09: qty 100

## 2021-02-09 MED ORDER — ONDANSETRON HCL 4 MG/2ML IJ SOLN
INTRAMUSCULAR | Status: AC
  Filled 2021-02-09: qty 4

## 2021-02-09 MED ORDER — VITAMIN D 25 MCG (1000 UNIT) PO TABS
1000.0000 [IU] | ORAL_TABLET | Freq: Every day | ORAL | Status: DC
  Administered 2021-02-09 – 2021-02-11 (×3): 1000 [IU] via ORAL
  Filled 2021-02-09 (×3): qty 1

## 2021-02-09 MED ORDER — PROMETHAZINE HCL 25 MG/ML IJ SOLN: 6.2500 mg | INTRAMUSCULAR | Status: DC | PRN

## 2021-02-09 MED ORDER — OXYCODONE HCL 5 MG PO TABS
5.0000 mg | ORAL_TABLET | ORAL | Status: DC | PRN
  Administered 2021-02-09 – 2021-02-11 (×6): 5 mg via ORAL
  Filled 2021-02-09 (×6): qty 1

## 2021-02-09 MED ORDER — ACETAMINOPHEN 500 MG PO TABS
1000.0000 mg | ORAL_TABLET | Freq: Once | ORAL | Status: AC
  Administered 2021-02-09: 1000 mg via ORAL

## 2021-02-09 MED ORDER — OXYCODONE HCL 5 MG PO TABS
10.0000 mg | ORAL_TABLET | ORAL | Status: DC | PRN
  Administered 2021-02-09: 5 mg via ORAL
  Filled 2021-02-09: qty 2

## 2021-02-09 MED ORDER — VITAMIN B-12 1000 MCG PO TABS
1000.0000 ug | ORAL_TABLET | Freq: Every day | ORAL | Status: DC
  Administered 2021-02-09 – 2021-02-11 (×3): 1000 ug via ORAL
  Filled 2021-02-09 (×3): qty 1

## 2021-02-09 MED ORDER — EPHEDRINE 5 MG/ML INJ
INTRAVENOUS | Status: AC
  Filled 2021-02-09: qty 20

## 2021-02-09 MED ORDER — TRANEXAMIC ACID-NACL 1000-0.7 MG/100ML-% IV SOLN
INTRAVENOUS | Status: AC
  Filled 2021-02-09: qty 100

## 2021-02-09 MED ORDER — PANTOPRAZOLE SODIUM 40 MG PO TBEC
40.0000 mg | DELAYED_RELEASE_TABLET | Freq: Every day | ORAL | Status: DC
  Administered 2021-02-09 – 2021-02-11 (×3): 40 mg via ORAL
  Filled 2021-02-09 (×3): qty 1

## 2021-02-09 MED ORDER — HYDROMORPHONE HCL 1 MG/ML IJ SOLN
0.2500 mg | INTRAMUSCULAR | Status: DC | PRN
  Administered 2021-02-09 (×4): 0.5 mg via INTRAVENOUS

## 2021-02-09 MED ORDER — PHENYLEPHRINE 40 MCG/ML (10ML) SYRINGE FOR IV PUSH (FOR BLOOD PRESSURE SUPPORT)
PREFILLED_SYRINGE | INTRAVENOUS | Status: AC
  Filled 2021-02-09: qty 10

## 2021-02-09 MED ORDER — CEFAZOLIN SODIUM-DEXTROSE 1-4 GM/50ML-% IV SOLN
1.0000 g | Freq: Four times a day (QID) | INTRAVENOUS | Status: AC
  Administered 2021-02-09 (×2): 1 g via INTRAVENOUS
  Filled 2021-02-09 (×2): qty 50

## 2021-02-09 MED ORDER — EPHEDRINE SULFATE-NACL 50-0.9 MG/10ML-% IV SOSY
PREFILLED_SYRINGE | INTRAVENOUS | Status: DC | PRN
  Administered 2021-02-09: 5 mg via INTRAVENOUS

## 2021-02-09 MED ORDER — TRANEXAMIC ACID-NACL 1000-0.7 MG/100ML-% IV SOLN
1000.0000 mg | INTRAVENOUS | Status: AC
  Administered 2021-02-09: 1000 mg via INTRAVENOUS

## 2021-02-09 MED ORDER — MEPIVACAINE HCL (PF) 2 % IJ SOLN
INTRAMUSCULAR | Status: AC
  Filled 2021-02-09: qty 20

## 2021-02-09 MED ORDER — LIDOCAINE 2% (20 MG/ML) 5 ML SYRINGE
INTRAMUSCULAR | Status: AC
  Filled 2021-02-09: qty 15

## 2021-02-09 MED ORDER — ORAL CARE MOUTH RINSE: 15.0000 mL | Freq: Once | OROMUCOSAL | Status: AC

## 2021-02-09 MED ORDER — DOCUSATE SODIUM 100 MG PO CAPS
100.0000 mg | ORAL_CAPSULE | Freq: Two times a day (BID) | ORAL | Status: DC
  Administered 2021-02-09 – 2021-02-11 (×5): 100 mg via ORAL
  Filled 2021-02-09 (×5): qty 1

## 2021-02-09 MED ORDER — ROSUVASTATIN CALCIUM 10 MG PO TABS
10.0000 mg | ORAL_TABLET | Freq: Every day | ORAL | Status: DC
  Administered 2021-02-09 – 2021-02-10 (×2): 10 mg via ORAL
  Filled 2021-02-09 (×2): qty 1

## 2021-02-09 MED ORDER — ROPINIROLE HCL ER 4 MG PO TB24
4.0000 mg | ORAL_TABLET | Freq: Every day | ORAL | Status: DC
  Administered 2021-02-09 – 2021-02-10 (×2): 4 mg via ORAL
  Filled 2021-02-09 (×2): qty 1

## 2021-02-09 MED ORDER — MIDAZOLAM HCL 2 MG/2ML IJ SOLN
INTRAMUSCULAR | Status: DC | PRN
  Administered 2021-02-09 (×2): 1 mg via INTRAVENOUS

## 2021-02-09 MED ORDER — DEXAMETHASONE SODIUM PHOSPHATE 10 MG/ML IJ SOLN
INTRAMUSCULAR | Status: AC
  Filled 2021-02-09: qty 2

## 2021-02-09 MED ORDER — POVIDONE-IODINE 10 % EX SWAB
2.0000 "application " | Freq: Once | CUTANEOUS | Status: AC
  Administered 2021-02-09: 2 via TOPICAL

## 2021-02-09 MED ORDER — ONDANSETRON HCL 4 MG/2ML IJ SOLN
INTRAMUSCULAR | Status: DC | PRN
  Administered 2021-02-09: 4 mg via INTRAVENOUS

## 2021-02-09 MED ORDER — ONDANSETRON HCL 4 MG PO TABS: 4.0000 mg | ORAL_TABLET | Freq: Four times a day (QID) | ORAL | Status: DC | PRN

## 2021-02-09 MED ORDER — METOCLOPRAMIDE HCL 5 MG PO TABS: 5.0000 mg | ORAL_TABLET | Freq: Three times a day (TID) | ORAL | Status: DC | PRN

## 2021-02-09 MED ORDER — OXYCODONE HCL 5 MG/5ML PO SOLN: 5.0000 mg | Freq: Once | ORAL | Status: DC | PRN

## 2021-02-09 MED ORDER — CHLORHEXIDINE GLUCONATE 0.12 % MT SOLN
15.0000 mL | Freq: Once | OROMUCOSAL | Status: AC
  Administered 2021-02-09: 15 mL via OROMUCOSAL

## 2021-02-09 MED ORDER — STERILE WATER FOR IRRIGATION IR SOLN
Status: DC | PRN
  Administered 2021-02-09: 2000 mL

## 2021-02-09 MED ORDER — CEFAZOLIN SODIUM-DEXTROSE 2-4 GM/100ML-% IV SOLN
2.0000 g | INTRAVENOUS | Status: AC
  Administered 2021-02-09: 2 g via INTRAVENOUS

## 2021-02-09 MED ORDER — HYDROMORPHONE HCL 1 MG/ML IJ SOLN
INTRAMUSCULAR | Status: AC
  Filled 2021-02-09: qty 1

## 2021-02-09 MED ORDER — MENTHOL 3 MG MT LOZG: 1.0000 | LOZENGE | OROMUCOSAL | Status: DC | PRN

## 2021-02-09 MED ORDER — HYDROMORPHONE HCL 1 MG/ML IJ SOLN: 0.5000 mg | INTRAMUSCULAR | Status: DC | PRN

## 2021-02-09 MED ORDER — PROPOFOL 500 MG/50ML IV EMUL
INTRAVENOUS | Status: AC
  Filled 2021-02-09: qty 50

## 2021-02-09 MED ORDER — ALUM & MAG HYDROXIDE-SIMETH 200-200-20 MG/5ML PO SUSP: 30.0000 mL | ORAL | Status: DC | PRN

## 2021-02-09 MED ORDER — METOCLOPRAMIDE HCL 5 MG/ML IJ SOLN: 5.0000 mg | Freq: Three times a day (TID) | INTRAMUSCULAR | Status: DC | PRN

## 2021-02-09 MED ORDER — LEVOTHYROXINE SODIUM 75 MCG PO TABS
150.0000 ug | ORAL_TABLET | Freq: Every day | ORAL | Status: DC
  Administered 2021-02-10 – 2021-02-11 (×2): 150 ug via ORAL
  Filled 2021-02-09 (×2): qty 2

## 2021-02-09 MED ORDER — MIDAZOLAM HCL 2 MG/2ML IJ SOLN
INTRAMUSCULAR | Status: AC
  Filled 2021-02-09: qty 2

## 2021-02-09 MED ORDER — ACETAMINOPHEN 325 MG PO TABS: 325.0000 mg | ORAL_TABLET | Freq: Four times a day (QID) | ORAL | Status: DC | PRN

## 2021-02-09 MED ORDER — OXYCODONE HCL 5 MG PO TABS
5.0000 mg | ORAL_TABLET | Freq: Once | ORAL | Status: AC | PRN
  Administered 2021-02-09: 5 mg via ORAL

## 2021-02-09 MED ORDER — SUCCINYLCHOLINE CHLORIDE 200 MG/10ML IV SOSY
PREFILLED_SYRINGE | INTRAVENOUS | Status: AC
  Filled 2021-02-09: qty 10

## 2021-02-09 MED ORDER — PHENYLEPHRINE HCL-NACL 10-0.9 MG/250ML-% IV SOLN
INTRAVENOUS | Status: DC | PRN
  Administered 2021-02-09: 25 ug/min via INTRAVENOUS

## 2021-02-09 MED ORDER — PROPOFOL 500 MG/50ML IV EMUL
INTRAVENOUS | Status: DC | PRN
  Administered 2021-02-09: 25 ug/kg/min via INTRAVENOUS

## 2021-02-09 MED ORDER — LIDOCAINE 2% (20 MG/ML) 5 ML SYRINGE
INTRAMUSCULAR | Status: DC | PRN
  Administered 2021-02-09: 40 mg via INTRAVENOUS

## 2021-02-09 MED ORDER — SODIUM CHLORIDE 0.9 % IR SOLN
Status: DC | PRN
  Administered 2021-02-09: 1000 mL

## 2021-02-09 MED ORDER — ROCURONIUM BROMIDE 10 MG/ML (PF) SYRINGE
PREFILLED_SYRINGE | INTRAVENOUS | Status: AC
  Filled 2021-02-09: qty 10

## 2021-02-09 SURGICAL SUPPLY — 43 items
ACETAB CUP W/GRIPTION 54 (Plate) ×2 IMPLANT
APL SKNCLS STERI-STRIP NONHPOA (GAUZE/BANDAGES/DRESSINGS)
BAG SPEC THK2 15X12 ZIP CLS (MISCELLANEOUS)
BAG ZIPLOCK 12X15 (MISCELLANEOUS) IMPLANT
BENZOIN TINCTURE PRP APPL 2/3 (GAUZE/BANDAGES/DRESSINGS) IMPLANT
BLADE SAW SGTL 18X1.27X75 (BLADE) ×2 IMPLANT
COLLAR OFFSET CORAIL SZ 10 HIP (Stem) IMPLANT
CORAIL OFFSET COLLAR SZ 10 HIP (Stem) ×2 IMPLANT
COVER PERINEAL POST (MISCELLANEOUS) ×2 IMPLANT
COVER SURGICAL LIGHT HANDLE (MISCELLANEOUS) ×2 IMPLANT
COVER WAND RF STERILE (DRAPES) ×2 IMPLANT
CUP ACETAB W/GRIPTION 54 (Plate) IMPLANT
DRAPE STERI IOBAN 125X83 (DRAPES) ×2 IMPLANT
DRAPE U-SHAPE 47X51 STRL (DRAPES) ×4 IMPLANT
DRSG AQUACEL AG ADV 3.5X10 (GAUZE/BANDAGES/DRESSINGS) ×2 IMPLANT
DURAPREP 26ML APPLICATOR (WOUND CARE) ×2 IMPLANT
ELECT REM PT RETURN 15FT ADLT (MISCELLANEOUS) ×2 IMPLANT
GAUZE XEROFORM 1X8 LF (GAUZE/BANDAGES/DRESSINGS) ×2 IMPLANT
GLOVE SRG 8 PF TXTR STRL LF DI (GLOVE) ×2 IMPLANT
GLOVE SURG ENC MOIS LTX SZ7.5 (GLOVE) ×2 IMPLANT
GLOVE SURG LTX SZ8 (GLOVE) ×2 IMPLANT
GLOVE SURG UNDER POLY LF SZ8 (GLOVE) ×4
GOWN STRL REUS W/TWL XL LVL3 (GOWN DISPOSABLE) ×4 IMPLANT
HANDPIECE INTERPULSE COAX TIP (DISPOSABLE) ×2
HEAD M SROM 36MM 2 (Hips) IMPLANT
HOLDER FOLEY CATH W/STRAP (MISCELLANEOUS) ×2 IMPLANT
KIT TURNOVER KIT A (KITS) ×2 IMPLANT
LINER NEUTRAL 54X36MM PLUS 4 (Hips) ×1 IMPLANT
PACK ANTERIOR HIP CUSTOM (KITS) ×2 IMPLANT
PENCIL SMOKE EVACUATOR (MISCELLANEOUS) IMPLANT
SET HNDPC FAN SPRY TIP SCT (DISPOSABLE) ×1 IMPLANT
SROM M HEAD 36MM 2 (Hips) ×2 IMPLANT
STAPLER VISISTAT 35W (STAPLE) ×1 IMPLANT
STRIP CLOSURE SKIN 1/2X4 (GAUZE/BANDAGES/DRESSINGS) IMPLANT
SUT ETHIBOND NAB CT1 #1 30IN (SUTURE) ×2 IMPLANT
SUT ETHILON 2 0 PS N (SUTURE) IMPLANT
SUT MNCRL AB 4-0 PS2 18 (SUTURE) IMPLANT
SUT VIC AB 0 CT1 36 (SUTURE) ×2 IMPLANT
SUT VIC AB 1 CT1 36 (SUTURE) ×2 IMPLANT
SUT VIC AB 2-0 CT1 27 (SUTURE) ×4
SUT VIC AB 2-0 CT1 TAPERPNT 27 (SUTURE) ×2 IMPLANT
TRAY FOLEY MTR SLVR 14FR STAT (SET/KITS/TRAYS/PACK) ×1 IMPLANT
TUBE SUCTION HIGH CAP CLEAR NV (SUCTIONS) ×1 IMPLANT

## 2021-02-09 NOTE — Care Plan (Addendum)
Ortho Bundle Case Management Note  Patient Details  Name: Veronica Finley MRN: [id number] Date of Birth: 1951-02-17    [facility] call to patient to discuss her upcoming Left total hip replacement with Dr. Ninfa Linden on 02/09/21 at [place]. She is an Ortho bundle patient through [facility] and is agreeable to case management. She lives alone, but states she has friends that live nearby that will be assisting her after surgery. She has a FWW and elevated toilets in her home. (Declined 3in1/BSC, but would like one if she feels needs it after surgery). Anticipate HHPT will be needed after short hospital stay. Choice provided and referral made to [facility] for PT (Former[facility]). F/U with MD scheduled for 2 weeks post-op. Reviewed all post-op care instructions. Will follow for needs.                     DME Arranged:   (Patient has a FWW at home and elevated toilet seats; No DME needed.) DME Agency:     HH Arranged:  PT HH Agency:  [facility] (now [facility])  Additional Comments: Please contact me with any questions of if this plan should need to change.  Jamse Arn, RN, BSN, [employer]  [phone] 02/09/2021, 1:53 PM

## 2021-02-09 NOTE — Anesthesia Postprocedure Evaluation (Signed)
Anesthesia Post Note  Patient: Veronica Finley  Procedure(s) Performed: LEFT TOTAL HIP ARTHROPLASTY ANTERIOR APPROACH (Left Hip)     Patient location during evaluation: PACU Anesthesia Type: General Level of consciousness: awake and alert, oriented and patient cooperative Pain management: pain level controlled Vital Signs Assessment: post-procedure vital signs reviewed and stable Respiratory status: spontaneous breathing, nonlabored ventilation and respiratory function stable Cardiovascular status: blood pressure returned to baseline and stable Postop Assessment: no apparent nausea or vomiting Anesthetic complications: no Comments: Unable to place spinal, conversion to GA/LMA   No complications documented.  Last Vitals:  Vitals:   02/09/21 1245 02/09/21 1300  BP: 121/60   Pulse: 77 80  Resp: 15 10  Temp:  36.7 C  SpO2: 94% 95%    Last Pain:  Vitals:   02/09/21 1300  TempSrc:   PainSc: [place]

## 2021-02-09 NOTE — Transfer of Care (Signed)
Immediate Anesthesia Transfer of Care Note  Patient: Veronica Finley  Procedure(s) Performed: LEFT TOTAL HIP ARTHROPLASTY ANTERIOR APPROACH (Left Hip)  Patient Location: PACU  Anesthesia Type:General  Level of Consciousness: awake and alert   Airway & Oxygen Therapy: Patient Spontanous Breathing and Patient connected to face mask oxygen  Post-op Assessment: Report given to RN and Post -op Vital signs reviewed and stable  Post vital signs: Reviewed and stable  Last Vitals:  Vitals Value Taken Time  BP 122/72 02/09/21 1126  Temp    Pulse 80 02/09/21 1128  Resp 10 02/09/21 1128  SpO2 99 % 02/09/21 1128  Vitals shown include unvalidated device data.  Last Pain:  Vitals:   02/09/21 0734  TempSrc:   PainSc: 0-No pain      Patients Stated Pain Goal: 5 (09/19/10 1735)  Complications: No complications documented.

## 2021-02-09 NOTE — Plan of Care (Signed)

## 2021-02-09 NOTE — Interval H&P Note (Signed)
History and Physical Interval Note: The patient understands that she is here today for a left total hip arthroplasty to treat her left hip osteoarthritis.  The risks and benefits of surgery have been explained in detail and informed consent is obtained.  There is been no acute interval change in her medical status.  See recent H&P.  The left hip has been marked.  02/09/2021 8:32 AM  Veronica Finley  has presented today for surgery, with the diagnosis of Osteoarthritis Left Hip.  The various methods of treatment have been discussed with the patient and family. After consideration of risks, benefits and other options for treatment, the patient has consented to  Procedure(s): LEFT TOTAL HIP ARTHROPLASTY ANTERIOR APPROACH (Left) as a surgical intervention.  The patient's history has been reviewed, patient examined, no change in status, stable for surgery.  I have reviewed the patient's chart and labs.  Questions were answered to the patient's satisfaction.     Mcarthur Rossetti

## 2021-02-09 NOTE — Evaluation (Signed)
Physical Therapy Evaluation Patient Details Name: Veronica Finley MRN: [id number] DOB: [birth year] Today's Date: 02/09/2021   History of Present Illness  Pt s/p L THR and with hx of bil TKR, spinal stenosis, nephrectomy and gastric bypass  Clinical Impression  Pt s/p L THR and presents with decreased L LE strength/ROM and post op pain limiting functional mobility.  Pt should progress to dc home with intermittent assist of family/friends.    Follow Up Recommendations Home health PT;Follow surgeon's recommendation for DC plan and follow-up therapies    Equipment Recommendations  3in1 (PT)    Recommendations for Other Services       Precautions / Restrictions Precautions Precautions: Fall Restrictions Weight Bearing Restrictions: No Other Position/Activity Restrictions: WBAT      Mobility  Bed Mobility Overal bed mobility: Needs Assistance Bed Mobility: Supine to Sit     Supine to sit: Min assist     General bed mobility comments: cues for sequence and use of R LE to self assist    Transfers Overall transfer level: Needs assistance Equipment used: Rolling walker (2 wheeled) Transfers: Sit to/from Stand Sit to Stand: Min assist         General transfer comment: cues for LE management and use of UEs to self assist  Ambulation/Gait Ambulation/Gait assistance: Min assist Gait Distance (Feet): 47 Feet Assistive device: Rolling walker (2 wheeled) Gait Pattern/deviations: Step-to pattern;Decreased step length - right;Decreased step length - left;Shuffle;Trunk flexed Gait velocity: decr   General Gait Details: cues for sequence, posture and position from [employer]            Wheelchair Mobility    Modified Rankin (Stroke Patients Only)       Balance Overall balance assessment: Needs assistance Sitting-balance support: No upper extremity supported;Feet supported Sitting balance-Leahy Scale: Fair     Standing balance support: Bilateral upper  extremity supported Standing balance-Leahy Scale: Poor                               Pertinent Vitals/Pain Pain Assessment: 0-10 Pain Score: 6  Pain Location: L hip Pain Descriptors / Indicators: Aching;Sore Pain Intervention(s): Limited activity within patient's tolerance;Monitored during session;Premedicated before session;Ice applied    Home Living Family/patient expects to be discharged to:: Private residence Living Arrangements: Alone Available Help at Discharge: Family;Friend(s);Available PRN/intermittently Type of Home: House Home Access: Stairs to enter   [employer] of Steps: 1 Home Layout: One level Home Equipment: Environmental consultant - 2 wheels      Prior Function Level of Independence: Independent               Hand Dominance        Extremity/Trunk Assessment   Upper Extremity Assessment Upper Extremity Assessment: Overall WFL for tasks assessed    Lower Extremity Assessment Lower Extremity Assessment: LLE deficits/detail    Cervical / Trunk Assessment Cervical / Trunk Assessment: Normal  Communication   Communication: No difficulties  Cognition Arousal/Alertness: Awake/alert Behavior During Therapy: WFL for tasks assessed/performed Overall Cognitive Status: Within Functional Limits for tasks assessed                                        General Comments      Exercises Total Joint Exercises Ankle Circles/Pumps: AROM;Both;15 reps;Supine   Assessment/Plan    PT Assessment Patient needs continued PT  services  PT Problem List Decreased strength;Decreased range of motion;Decreased activity tolerance;Decreased balance;Decreased mobility;Decreased knowledge of use of DME;Pain;Obesity       PT Treatment Interventions DME instruction;Gait training;Stair training;Functional mobility training;Therapeutic activities;Therapeutic exercise;Patient/family education    PT Goals (Current goals can be found in the Care Plan  section)  Acute Rehab PT Goals Patient Stated Goal: Regain IND PT Goal Formulation: With patient Time For Goal Achievement: 02/16/21 Potential to Achieve Goals: Good    Frequency 7X/week   Barriers to discharge        Co-evaluation               AM-PAC PT "6 Clicks" Mobility  Outcome Measure Help needed turning from your back to your side while in a flat bed without using bedrails?: A Little Help needed moving from lying on your back to sitting on the side of a flat bed without using bedrails?: A Little Help needed moving to and from a bed to a chair (including a wheelchair)?: A Little Help needed standing up from a chair using your arms (e.g., wheelchair or bedside chair)?: A Little Help needed to walk in hospital room?: A Little Help needed climbing 3-5 steps with a railing? : A Lot 6 Click Score: 17    End of Session Equipment Utilized During Treatment: Gait belt Activity Tolerance: Patient tolerated treatment well Patient left: in chair;with call bell/phone within reach Nurse Communication: Mobility status PT Visit Diagnosis: Difficulty in walking, not elsewhere classified (R26.2)    Time: 2924-4628 PT Time Calculation (min) (ACUTE ONLY): 29 min   Charges:   PT Evaluation $PT Eval Low Complexity: 1 Low PT Treatments $Gait Training: 8-22 mins        [place] Pager [phone] Office [phone]   BRADSHAW,HUNTER 02/09/2021, 4:01 PM

## 2021-02-09 NOTE — Anesthesia Procedure Notes (Signed)
Procedure Name: LMA Insertion Date/Time: 02/09/2021 10:08 AM Performed by: Cynda Familia, CRNA Pre-anesthesia Checklist: Patient identified, Emergency Drugs available, Suction available and Patient being monitored Patient Re-evaluated:Patient Re-evaluated prior to induction Oxygen Delivery Method: Circle System Utilized Preoxygenation: Pre-oxygenation with 100% oxygen Induction Type: IV induction Ventilation: Mask ventilation without difficulty LMA: LMA inserted and LMA with gastric port inserted LMA Size: 4.0 Tube type: Oral Number of attempts: 1 Placement Confirmation: positive ETCO2 Tube secured with: Tape Dental Injury: Teeth and Oropharynx as per pre-operative assessment  Comments: Smooth IV induction Finucane-- LMA insertion AM CRNA atraumatic-- teeth and mouth as preop bilat BS Finucane

## 2021-02-09 NOTE — Brief Op Note (Signed)
02/09/2021  11:06 AM  PATIENT:  Veronica Finley  70 y.o. female  PRE-OPERATIVE DIAGNOSIS:  Osteoarthritis Left Hip  POST-OPERATIVE DIAGNOSIS:  Osteoarthritis Left Hip  PROCEDURE:  Procedure(s): LEFT TOTAL HIP ARTHROPLASTY ANTERIOR APPROACH (Left)  SURGEON:  Surgeon(s) and Role:    Mcarthur Rossetti, MD - Primary  PHYSICIAN ASSISTANT:  Benita Stabile, PA-C   ANESTHESIA:   spinal and general  EBL:  300 mL   COUNTS:  YES  DICTATION: .Other Dictation: Dictation Number [id number]  PLAN OF CARE: Admit for overnight observation  PATIENT DISPOSITION:  PACU - hemodynamically stable.   Delay start of Pharmacological VTE agent (>24hrs) due to surgical blood loss or risk of bleeding: no

## 2021-02-10 DIAGNOSIS — I10 Essential (primary) hypertension: Secondary | ICD-10-CM | POA: Diagnosis not present

## 2021-02-10 DIAGNOSIS — E782 Mixed hyperlipidemia: Secondary | ICD-10-CM | POA: Diagnosis not present

## 2021-02-10 DIAGNOSIS — E538 Deficiency of other specified B group vitamins: Secondary | ICD-10-CM | POA: Diagnosis not present

## 2021-02-10 DIAGNOSIS — Z96642 Presence of left artificial hip joint: Secondary | ICD-10-CM

## 2021-02-10 DIAGNOSIS — Z905 Acquired absence of kidney: Secondary | ICD-10-CM | POA: Diagnosis not present

## 2021-02-10 DIAGNOSIS — Z6836 Body mass index (BMI) 36.0-36.9, adult: Secondary | ICD-10-CM | POA: Diagnosis not present

## 2021-02-10 DIAGNOSIS — Z8616 Personal history of COVID-19: Secondary | ICD-10-CM | POA: Diagnosis not present

## 2021-02-10 DIAGNOSIS — E669 Obesity, unspecified: Secondary | ICD-10-CM | POA: Diagnosis present

## 2021-02-10 DIAGNOSIS — E039 Hypothyroidism, unspecified: Secondary | ICD-10-CM | POA: Diagnosis not present

## 2021-02-10 DIAGNOSIS — M1612 Unilateral primary osteoarthritis, left hip: Secondary | ICD-10-CM | POA: Diagnosis not present

## 2021-02-10 DIAGNOSIS — G2581 Restless legs syndrome: Secondary | ICD-10-CM | POA: Diagnosis not present

## 2021-02-10 DIAGNOSIS — G4733 Obstructive sleep apnea (adult) (pediatric): Secondary | ICD-10-CM | POA: Diagnosis not present

## 2021-02-10 DIAGNOSIS — R7303 Prediabetes: Secondary | ICD-10-CM | POA: Diagnosis present

## 2021-02-10 DIAGNOSIS — Z85528 Personal history of other malignant neoplasm of kidney: Secondary | ICD-10-CM | POA: Diagnosis not present

## 2021-02-10 DIAGNOSIS — Z79899 Other long term (current) drug therapy: Secondary | ICD-10-CM | POA: Diagnosis not present

## 2021-02-10 DIAGNOSIS — Z79890 Hormone replacement therapy (postmenopausal): Secondary | ICD-10-CM | POA: Diagnosis not present

## 2021-02-10 DIAGNOSIS — Z96653 Presence of artificial knee joint, bilateral: Secondary | ICD-10-CM | POA: Diagnosis not present

## 2021-02-10 DIAGNOSIS — Z9884 Bariatric surgery status: Secondary | ICD-10-CM | POA: Diagnosis not present

## 2021-02-10 LAB — BASIC METABOLIC PANEL
Anion gap: 8 (ref 5–15)
BUN: 19 mg/dL (ref 8–23)
CO2: 27 mmol/L (ref 22–32)
Calcium: 9 mg/dL (ref 8.9–10.3)
Chloride: 103 mmol/L (ref 98–111)
Creatinine, Ser: 1.38 mg/dL — ABNORMAL HIGH (ref 0.44–1.00)
GFR, Estimated: 42 mL/min — ABNORMAL LOW (ref >60)
Glucose, Bld: 138 mg/dL — ABNORMAL HIGH (ref 70–99)
Potassium: 4.6 mmol/L (ref 3.5–5.1)
Sodium: 138 mmol/L (ref 135–145)

## 2021-02-10 LAB — CBC
HCT: 34.5 % — ABNORMAL LOW (ref 36.0–46.0)
Hemoglobin: 11.4 g/dL — ABNORMAL LOW (ref 12.0–15.0)
MCH: 29.9 pg (ref 26.0–34.0)
MCHC: 33 g/dL (ref 30.0–36.0)
MCV: 90.6 fL (ref 80.0–100.0)
Platelets: 234 10*3/uL (ref 150–400)
RBC: 3.81 MIL/uL — ABNORMAL LOW (ref 3.87–5.11)
RDW: 13.2 % (ref 11.5–15.5)
WBC: 14.3 10*3/uL — ABNORMAL HIGH (ref 4.0–10.5)
nRBC: 0 % (ref 0.0–0.2)

## 2021-02-10 MED ORDER — METHOCARBAMOL 500 MG PO TABS: 500.0000 mg | ORAL_TABLET | Freq: Four times a day (QID) | ORAL | 1 refills | Status: DC | PRN

## 2021-02-10 MED ORDER — LIP MEDEX EX OINT
TOPICAL_OINTMENT | CUTANEOUS | Status: DC | PRN
  Filled 2021-02-10: qty 7

## 2021-02-10 MED ORDER — OXYCODONE HCL 10 MG PO TABS: 10.0000 mg | ORAL_TABLET | ORAL | 0 refills | Status: DC | PRN

## 2021-02-10 MED ORDER — ASPIRIN 81 MG PO CHEW: 81.0000 mg | CHEWABLE_TABLET | Freq: Two times a day (BID) | ORAL | 0 refills | Status: DC

## 2021-02-10 NOTE — Plan of Care (Signed)
  Problem: Education: Goal: Knowledge of the prescribed therapeutic regimen will improve Outcome: Progressing   Problem: Activity: Goal: Ability to avoid complications of mobility impairment will improve Outcome: Progressing   Problem: Activity: Goal: Ability to tolerate increased activity will improve Outcome: Progressing   Problem: Pain Management: Goal: Pain level will decrease with appropriate interventions Outcome: Progressing

## 2021-02-10 NOTE — TOC Progression Note (Signed)
Transition of Care [facility]) - Progression Note    Patient Details  Name: Veronica Finley MRN: [id number] Date of Birth: June 01, 1951  [facility] CM/SW Contact  Joaquin Courts, RN Phone Number: 02/10/2021, 9:51 AM  Clinical Narrative:    CM spoke with patient.  [facility]) to provide HHPT, adapt to deliver 3in1 to bedside for home use.  Patient reports has RW at home.  Expected Discharge Plan: [place] Barriers to Discharge: No Barriers Identified  Expected Discharge Plan and Services Expected Discharge Plan: [place]   Discharge Planning Services: CM Consult Post Acute Care Choice: [place] arrangements for the past 2 months: Single Family Home                 DME Arranged: 3-N-1 DME Agency: [employer] Date DME Agency Contacted: 02/10/21 Time DME Agency Contacted: [phone] Representative spoke with at DME Agency: [place]: PT [place]: [facility] ([facility])         Social Determinants of Health ([place]) Interventions    Readmission Risk Interventions No flowsheet data found.

## 2021-02-10 NOTE — Op Note (Signed)
NAMEWINSTON, Veronica Finley MEDICAL RECORD NO: [id number] ACCOUNT NO: [id number] DATE OF BIRTH: 02-17-1951 FACILITY: [facility]ATION: [facility] PHYSICIAN: Lind Guest. Ninfa Linden, MD  Operative Report   DATE OF PROCEDURE: 02/09/2021   PREOPERATIVE DIAGNOSIS:  Primary osteoarthritis and degenerative joint disease, left hip.  POSTOPERATIVE DIAGNOSIS:  Primary osteoarthritis and degenerative joint disease, left hip.  PROCEDURE:  Left total hip arthroplasty through direct anterior approach.  IMPLANTS:  DePuy sector Gription acetabular component, size 54, size 36+4 neutral polyethylene liner, size 10 Corail femoral component with high offset, size 36-2 metal hip ball.  SURGEON:  Jean Rosenthal, M.D.  ASSISTANT:  Erskine Emery, PA-C.  ANESTHESIA:    1.  Attempted spinal. 2.  General.  ANTIBIOTICS:  2 g IV Ancef.  ESTIMATED BLOOD LOSS:  858 mL  COMPLICATIONS:  None.  INDICATIONS:  The patient is a 70 year old female with debilitating arthritis involving both her hips.  The left hurts worse than the right, but they both are well documented in terms of end-stage arthritis.  At this point, she has tried and failed all  forms of conservative treatment and does wish to proceed with a left total hip arthroplasty through direct anterior approach.  I had a long and thorough discussion with her about this approach.  We talked about the risk of acute blood loss anemia, nerve  or vessel injury, fracture, infection, dislocation, DVT, implant failure and skin and soft tissue issues.  We talked about our goals being decreased pain, improve mobility and overall improve quality of life.  DESCRIPTION OF PROCEDURE:  After informed consent was obtained, appropriate left hip was marked.  She was brought to the operating room and sat up on a stretcher where they attempted spinal anesthesia, they were unsuccessful.  She was laid in supine  position and general anesthesia was obtained.  We were able to assess  her leg lengths and then placed traction boots on both feet.  Next, she was placed supine on the Hana fracture table.  The perineal post was placed and both legs in line skeletal  traction device and no traction applied.  Her left operative hip was prepped and draped in DuraPrep and sterile drapes.  A timeout was called and she was identified correct patient, correct left hip.  I then made an incision just inferior and posterior  to the anterior superior iliac spine and carried this obliquely down the leg.  We dissected down tensor fascia lata muscle.  Tensor fascia was then divided longitudinally to proceed with direct anterior approach to the hip.  We identified and cauterized  circumflex vessels and identified the hip capsule, opened the hip capsule in L-type format finding a moderate joint effusion.  We placed curved retractors around the medial and lateral femoral neck and made our femoral neck cut with an oscillating saw  just proximal to the lesser trochanter and completed this with an osteotome.  We placed a corkscrew guide in the femoral head and removed the femoral head in its entirety and found a wide area devoid of cartilage.  I then placed a bent Hohmann over the  medial acetabular rim and removed remnants of the acetabular labrum and other debris.  We then began reaming under direct visualization from a size 48 reamer going up to a size 53 with all reamers placed under direct visualization.  Last reamer under  direct fluoroscopy, so we could obtain our depth of reaming, our inclination and anteversion.  I then placed a real DePuy sector Gription acetabular  component, size 54 and a 36+4 neutral polyethylene liner given the medialization of the cup.  Attention  was then turned to the femur with the leg externally rotated 120 degrees extended and adducted.  We then placed a Mueller retractor medially and Hohman retractor behind the greater trochanter.  We used a box cutting osteotome to enter the  femoral canal  and a rongeur to lateralize, we then began broaching using the Corail broaching system from a size 8 to a size 10. With a size 10 in place, we trialed a standard offset femoral neck and a 36-2 hip ball, reduced this in the acetabulum and she definitely  needed more offset.  She was stable on exam, but definitely needed more offset based on the clinical exam and radiographic assessment. We dislocated the hip, removed the trial components.  We then placed the real Corail femoral component with high offset  size 10.  It is definitely bit hard and was certainly a little above the calcar, we went with a real 36-2 hip ball and reduced this in acetabulum and it was very tight with internal and external rotation and stable on exam.  She is definitely lengthened  a little bit as well, but her offset was improved significantly.  We then irrigated the soft tissue with normal saline solution using pulsatile lavage.  We closed the joint capsule with interrupted #1 Ethibond suture followed by running #1 Vicryl to  close the tensor fascia.  0 Vicryl was used to close the deep tissue and 2-0 Vicryl was used to close subcutaneous tissue, staples were used to close the skin.  Sterile dressing was applied.  She was taken off the Hana table, awakened, extubated, and  taken to recovery room in stable condition with all final counts being correct.  No complications noted.  Of note, Benita Stabile, PA-C did assist during the entire case and assistance was crucial for facilitating all aspects of this case.   Elián.Darby D: 02/09/2021 11:05:26 am T: 02/10/2021 3:18:00 am  JOB: [id number]/ [id number]

## 2021-02-10 NOTE — Progress Notes (Signed)
Physical Therapy Treatment Patient Details Name: Veronica Finley MRN: [id number] DOB: [birth year] Today's Date: 02/10/2021    History of Present Illness Pt s/p L THR and with hx of bil TKR, spinal stenosis, nephrectomy and gastric bypass    PT Comments    Pt continues very motivated and progressing steadily with mobility.  Pt pleased with progress and hopeful for dc home tomorrow but with limited assist.   Follow Up Recommendations  Home health PT;Follow surgeon's recommendation for DC plan and follow-up therapies     Equipment Recommendations  3in1 (PT)    Recommendations for Other Services       Precautions / Restrictions Precautions Precautions: Fall Restrictions Weight Bearing Restrictions: No Other Position/Activity Restrictions: WBAT    Mobility  Bed Mobility Overal bed mobility: Needs Assistance Bed Mobility: Sit to Supine       Sit to supine: Min guard   General bed mobility comments: cues for sequence and use of R LE to self assist    Transfers Overall transfer level: Needs assistance Equipment used: Rolling walker (2 wheeled) Transfers: Sit to/from Stand Sit to Stand: Min guard         General transfer comment: cues for LE management and use of UEs to self assist  Ambulation/Gait Ambulation/Gait assistance: Min guard;Supervision Gait Distance (Feet): 250 Feet Assistive device: Rolling walker (2 wheeled) Gait Pattern/deviations: Decreased step length - right;Decreased step length - left;Shuffle;Trunk flexed;Step-to pattern;Step-through pattern Gait velocity: decr   General Gait Details: cues for sequence, posture and position from [employer] Mobility    Modified Rankin (Stroke Patients Only)       Balance Overall balance assessment: Needs assistance Sitting-balance support: No upper extremity supported;Feet supported Sitting balance-Leahy Scale: Good     Standing balance support: No upper extremity  supported Standing balance-Leahy Scale: Fair                              Cognition Arousal/Alertness: Awake/alert Behavior During Therapy: WFL for tasks assessed/performed Overall Cognitive Status: Within Functional Limits for tasks assessed                                        Exercises      General Comments        Pertinent Vitals/Pain Pain Assessment: 0-10 Pain Score: 5  Pain Location: L hip Pain Descriptors / Indicators: Aching;Sore Pain Intervention(s): Limited activity within patient's tolerance;Monitored during session;Premedicated before session;Ice applied    Home Living                      Prior Function            PT Goals (current goals can now be found in the care plan section) Acute Rehab PT Goals Patient Stated Goal: Regain IND PT Goal Formulation: With patient Time For Goal Achievement: 02/16/21 Potential to Achieve Goals: Good Progress towards PT goals: Progressing toward goals    Frequency    7X/week      PT Plan Current plan remains appropriate    Co-evaluation              AM-PAC PT "6 Clicks" Mobility   Outcome Measure  Help needed turning from your back to your side while in a  flat bed without using bedrails?: A Little Help needed moving from lying on your back to sitting on the side of a flat bed without using bedrails?: A Little Help needed moving to and from a bed to a chair (including a wheelchair)?: A Little Help needed standing up from a chair using your arms (e.g., wheelchair or bedside chair)?: A Little Help needed to walk in hospital room?: A Little Help needed climbing 3-5 steps with a railing? : A Lot 6 Click Score: 17    End of Session Equipment Utilized During Treatment: Gait belt Activity Tolerance: Patient tolerated treatment well Patient left: in bed;with call bell/phone within reach Nurse Communication: Mobility status PT Visit Diagnosis: Difficulty in walking, not  elsewhere classified (R26.2)     Time: 7408-1448 PT Time Calculation (min) (ACUTE ONLY): 26 min  Charges:  $Gait Training: 23-37 mins                     [place] Pager [phone] Office [phone]    Marlaysia Lenig 02/10/2021, 3:02 PM

## 2021-02-10 NOTE — Progress Notes (Signed)
Subjective: 1 Day Post-Op Procedure(s) (LRB): LEFT TOTAL HIP ARTHROPLASTY ANTERIOR APPROACH (Left) Patient reports pain as moderate.    Objective: Vital signs in last 24 hours: Temp:  [97.7 F (36.5 C)-98.9 F (37.2 C)] 98.9 F (37.2 C) (03/26 1003) Pulse Rate:  [74-92] 92 (03/26 1003) Resp:  [10-20] 19 (03/26 1003) BP: (105-135)/(59-78) 135/72 (03/26 1003) SpO2:  [94 %-100 %] 97 % (03/26 1003)  Intake/Output from previous day: 03/25 0701 - 03/26 0700 In: 2587.5 [I.V.:2287.5; IV Piggyback:300] Out: 2000 [Urine:1700; Blood:300] Intake/Output this shift: Total I/O In: 240 [P.O.:240] Out: 600 [Urine:600]  Recent Labs    02/10/21 0256  HGB 11.4*   Recent Labs    02/10/21 0256  WBC 14.3*  RBC 3.81*  HCT 34.5*  PLT 234   Recent Labs    02/10/21 0256  NA 138  K 4.6  CL 103  CO2 27  BUN 19  CREATININE 1.38*  GLUCOSE 138*  CALCIUM 9.0   No results for input(s): LABPT, INR in the last 72 hours.  Sensation intact distally Intact pulses distally Dorsiflexion/Plantar flexion intact Incision: dressing C/D/I   Assessment/Plan: 1 Day Post-Op Procedure(s) (LRB): LEFT TOTAL HIP ARTHROPLASTY ANTERIOR APPROACH (Left) Up with therapy Plan for discharge tomorrow Discharge home with home health  Needs one more full day of therapy here since lives alone.    Mcarthur Rossetti 02/10/2021, 11:17 AM

## 2021-02-10 NOTE — Discharge Instructions (Signed)

## 2021-02-10 NOTE — Progress Notes (Signed)
Physical Therapy Treatment Patient Details Name: Veronica Finley MRN: [id number] DOB: 11-27-1950 Today's Date: 02/10/2021    History of Present Illness Pt s/p L THR and with hx of bil TKR, spinal stenosis, nephrectomy and gastric bypass    PT Comments    Pt in good spirits and with noted improvement in activity tolerance.  Pt hopeful for dc home tomorrow.   Follow Up Recommendations  Home health PT;Follow surgeon's recommendation for DC plan and follow-up therapies     Equipment Recommendations  3in1 (PT)    Recommendations for Other Services       Precautions / Restrictions Precautions Precautions: Fall Restrictions Weight Bearing Restrictions: No Other Position/Activity Restrictions: WBAT    Mobility  Bed Mobility               General bed mobility comments: Pt up in chair and requests back to same    Transfers Overall transfer level: Needs assistance Equipment used: Rolling walker (2 wheeled) Transfers: Sit to/from Stand Sit to Stand: Min assist         General transfer comment: cues for LE management and use of UEs to self assist  Ambulation/Gait Ambulation/Gait assistance: Min assist;Min guard Gait Distance (Feet): 150 Feet Assistive device: Rolling walker (2 wheeled) Gait Pattern/deviations: Step-to pattern;Decreased step length - right;Decreased step length - left;Shuffle;Trunk flexed Gait velocity: decr   General Gait Details: cues for sequence, posture and position from [employer]             Wheelchair Mobility    Modified Rankin (Stroke Patients Only)       Balance Overall balance assessment: Needs assistance Sitting-balance support: No upper extremity supported;Feet supported Sitting balance-Leahy Scale: Good     Standing balance support: Bilateral upper extremity supported Standing balance-Leahy Scale: Poor                              Cognition Arousal/Alertness: Awake/alert Behavior During Therapy:  WFL for tasks assessed/performed Overall Cognitive Status: Within Functional Limits for tasks assessed                                        Exercises Total Joint Exercises Ankle Circles/Pumps: AROM;Both;15 reps;Supine Quad Sets: AROM;10 reps;Supine;Both Heel Slides: AAROM;20 reps;Supine;Left Hip ABduction/ADduction: Left;AAROM;15 reps;Supine    General Comments        Pertinent Vitals/Pain Pain Assessment: 0-10 Pain Score: 6  Pain Location: L hip Pain Descriptors / Indicators: Aching;Sore Pain Intervention(s): Limited activity within patient's tolerance;Monitored during session;Premedicated before session;Ice applied    Home Living                      Prior Function            PT Goals (current goals can now be found in the care plan section) Acute Rehab PT Goals Patient Stated Goal: Regain IND PT Goal Formulation: With patient Time For Goal Achievement: 02/16/21 Potential to Achieve Goals: Good Progress towards PT goals: Progressing toward goals    Frequency    7X/week      PT Plan Current plan remains appropriate    Co-evaluation              AM-PAC PT "6 Clicks" Mobility   Outcome Measure  Help needed turning from your back to your side while in a  flat bed without using bedrails?: A Little Help needed moving from lying on your back to sitting on the side of a flat bed without using bedrails?: A Little Help needed moving to and from a bed to a chair (including a wheelchair)?: A Little Help needed standing up from a chair using your arms (e.g., wheelchair or bedside chair)?: A Little Help needed to walk in hospital room?: A Little Help needed climbing 3-5 steps with a railing? : A Lot 6 Click Score: 17    End of Session Equipment Utilized During Treatment: Gait belt Activity Tolerance: Patient tolerated treatment well Patient left: in chair;with call bell/phone within reach Nurse Communication: Mobility status PT  Visit Diagnosis: Difficulty in walking, not elsewhere classified (R26.2)     Time: 1157-2620 PT Time Calculation (min) (ACUTE ONLY): 29 min  Charges:  $Gait Training: 8-22 mins $Therapeutic Exercise: 8-22 mins                     [place] Pager [phone] Office [phone]    Jovan Colligan 02/10/2021, 12:34 PM

## 2021-02-11 NOTE — Progress Notes (Signed)
Patient ID: Veronica Finley, female   DOB: [birth year], 70 y.o.   MRN: [id number] Plan for discharge today after physical therapy.  Postop day 2 left total hip arthroplasty.

## 2021-02-11 NOTE — Discharge Summary (Signed)
Discharge Diagnoses:  Principal Problem:   Unilateral primary osteoarthritis, left hip Active Problems:   Status post total replacement of left hip   Status post left hip replacement   Surgeries: Procedure(s): LEFT TOTAL HIP ARTHROPLASTY ANTERIOR APPROACH on 02/09/2021    Consultants:   Discharged Condition: Improved  Hospital Course: Veronica Finley is an 70 y.o. female who was admitted 02/09/2021 with a chief complaint of osteoarthritis left hip, with a final diagnosis of Osteoarthritis Left Hip.  Patient was brought to the operating room on 02/09/2021 and underwent Procedure(s): LEFT TOTAL HIP ARTHROPLASTY ANTERIOR APPROACH.    Patient was given perioperative antibiotics:  Anti-infectives (From admission, onward)   Start     Dose/Rate Route Frequency Ordered Stop   02/09/21 1600  ceFAZolin (ANCEF) IVPB 1 g/50 mL premix        1 g 100 mL/hr over 30 Minutes Intravenous Every 6 hours 02/09/21 1125 02/09/21 2321   02/09/21 0730  ceFAZolin (ANCEF) IVPB 2g/100 mL premix        2 g 200 mL/hr over 30 Minutes Intravenous On call to O.R. 02/09/21 0722 02/09/21 1008   02/09/21 0729  ceFAZolin (ANCEF) 2-4 GM/100ML-% IVPB       Note to Pharmacy: Randa Evens  : cabinet override      02/09/21 0729 02/09/21 1025    .  Patient was given sequential compression devices, early ambulation, and aspirin for DVT prophylaxis.  Recent vital signs:  Patient Vitals for the past 24 hrs:  BP Temp Temp src Pulse Resp SpO2  02/11/21 0450 123/61 99.4 F (37.4 C) Oral 79 17 95 %  02/10/21 2136 106/63 98.2 F (36.8 C) Oral 86 16 96 %  02/10/21 1413 (!) 111/57 98.7 F (37.1 C) Oral 81 17 95 %  02/10/21 1003 135/72 98.9 F (37.2 C) Oral 92 19 97 %  02/10/21 1003 135/72 98.9 F (37.2 C) Oral 92 19 --  .  Recent laboratory studies: DG Pelvis Portable  Result Date: 02/09/2021 CLINICAL DATA:  Status post total hip replacement EXAM: PORTABLE PELVIS 1-2 VIEWS COMPARISON:  Intraoperative images  left hip February 09, 2021 FINDINGS: Frontal view of mid to lower pelvis and both hips obtained. There is a total hip replacement on the left with prosthetic components well-seated on frontal view. No fracture or dislocation. There is moderately severe narrowing of the right hip joint. There is soft tissue air on the left. IMPRESSION: Status post total hip replacement on the left with prosthetic components well-seated on frontal view. No acute fracture or dislocation. Extensive osteoarthritic change in the right hip joint. Acute postoperative changes on the left. Electronically Signed   By: Lowella Grip III M.D.   On: 02/09/2021 13:58   DG C-Arm 1-60 Min-No Report  Result Date: 02/09/2021 Fluoroscopy was utilized by the requesting physician.  No radiographic interpretation.   DG HIP OPERATIVE UNILAT W OR W/O PELVIS LEFT  Result Date: 02/09/2021 CLINICAL DATA:  Left total hip replacement EXAM: OPERATIVE LEFT HIP   2 VIEWS TECHNIQUE: Fluoroscopic spot image(s) were submitted for interpretation post-operatively. COMPARISON:  None. FLUOROSCOPY TIME:  0 minutes 14 seconds; 3 acquired images FINDINGS: Frontal and oblique views obtained. There is a total hip replacement on the left with prosthetic components well-seated. No fracture or dislocation. Moderate narrowing right hip joint. IMPRESSION: Status post total hip replacement on the left with prosthetic components well-seated. No fracture or dislocation narrowing right hip joint. Electronically Signed   By: Lowella Grip III M.D.  On: 02/09/2021 13:24    Discharge Medications:   Allergies as of 02/11/2021   No Known Allergies     Medication List    STOP taking these medications   traMADol 50 MG tablet Commonly known as: Ultram     TAKE these medications   acetaminophen 500 MG tablet Commonly known as: TYLENOL Take 1,000 mg by mouth every 6 (six) hours as needed for moderate pain.   aspirin 81 MG chewable tablet Chew 1 tablet (81 mg  total) by mouth 2 (two) times daily.   cholecalciferol 25 MCG (1000 UNIT) tablet Commonly known as: VITAMIN D3 Take 1,000 Units by mouth daily.   diclofenac Sodium 1 % Gel Commonly known as: VOLTAREN Apply 2 g topically daily as needed (hip pain).   diltiazem 240 MG 24 hr capsule Commonly known as: CARDIZEM CD Take 1 capsule (240 mg total) by mouth daily.   docusate sodium 100 MG capsule Commonly known as: COLACE Take 1 capsule (100 mg total) by mouth 2 (two) times daily. What changed: when to take this   FLUoxetine 20 MG capsule Commonly known as: PROZAC Take 1 capsule (20 mg total) by mouth daily. Needs office visit   levothyroxine 150 MCG tablet Commonly known as: SYNTHROID Take 1 tablet (150 mcg total) by mouth daily.   losartan-hydrochlorothiazide 100-25 MG tablet Commonly known as: HYZAAR Take 1 tablet by mouth daily.   methocarbamol 500 MG tablet Commonly known as: ROBAXIN Take 1 tablet (500 mg total) by mouth every 6 (six) hours as needed for muscle spasms.   Oxycodone HCl 10 MG Tabs Take 1-1.5 tablets (10-15 mg total) by mouth every 4 (four) hours as needed for severe pain (pain score 7-10).   rOPINIRole 4 MG 24 hr tablet Commonly known as: REQUIP XL Take 1 tablet (4 mg total) by mouth at bedtime.   rosuvastatin 10 MG tablet Commonly known as: CRESTOR Take 1 tablet (10 mg total) by mouth daily.   vitamin B-12 1000 MCG tablet Commonly known as: CYANOCOBALAMIN Take 1,000 mcg by mouth daily.            Durable Medical Equipment  (From admission, onward)         Start     Ordered   02/09/21 1334  DME 3 n 1  Once        02/09/21 1333   02/09/21 1334  DME Walker rolling  Once       Question Answer Comment  Walker: With 5 Inch Wheels   Patient needs a walker to treat with the following condition Status post left hip replacement      02/09/21 1333          Diagnostic Studies: DG Chest 2 View  Result Date: 01/30/2021 CLINICAL DATA:  History  nephrectomy in November, 2021 for carcinoma. EXAM: CHEST - 2 VIEW COMPARISON:  PA and lateral chest 03/14/2006. FINDINGS: The lungs are clear. Heart size is normal. No pneumothorax or pleural effusion. No acute or focal bony abnormality. IMPRESSION: Negative for acute or metastatic disease. Electronically Signed   By: Inge Rise M.D.   On: 01/30/2021 09:07   DG Pelvis Portable  Result Date: 02/09/2021 CLINICAL DATA:  Status post total hip replacement EXAM: PORTABLE PELVIS 1-2 VIEWS COMPARISON:  Intraoperative images left hip February 09, 2021 FINDINGS: Frontal view of mid to lower pelvis and both hips obtained. There is a total hip replacement on the left with prosthetic components well-seated on frontal view. No fracture or dislocation.  There is moderately severe narrowing of the right hip joint. There is soft tissue air on the left. IMPRESSION: Status post total hip replacement on the left with prosthetic components well-seated on frontal view. No acute fracture or dislocation. Extensive osteoarthritic change in the right hip joint. Acute postoperative changes on the left. Electronically Signed   By: Lowella Grip III M.D.   On: 02/09/2021 13:58   DG C-Arm 1-60 Min-No Report  Result Date: 02/09/2021 Fluoroscopy was utilized by the requesting physician.  No radiographic interpretation.   DG HIP OPERATIVE UNILAT W OR W/O PELVIS LEFT  Result Date: 02/09/2021 CLINICAL DATA:  Left total hip replacement EXAM: OPERATIVE LEFT HIP   2 VIEWS TECHNIQUE: Fluoroscopic spot image(s) were submitted for interpretation post-operatively. COMPARISON:  None. FLUOROSCOPY TIME:  0 minutes 14 seconds; 3 acquired images FINDINGS: Frontal and oblique views obtained. There is a total hip replacement on the left with prosthetic components well-seated. No fracture or dislocation. Moderate narrowing right hip joint. IMPRESSION: Status post total hip replacement on the left with prosthetic components well-seated. No fracture  or dislocation narrowing right hip joint. Electronically Signed   By: Lowella Grip III M.D.   On: 02/09/2021 13:24    Patient benefited maximally from their hospital stay and there were no complications.     Disposition: Discharge disposition: 01-Home or Self Care      Discharge Instructions    Call MD / Call 911   Complete by: As directed    If you experience chest pain or shortness of breath, CALL 911 and be transported to the hospital emergency room.  If you develope a fever above 101 F, pus (white drainage) or increased drainage or redness at the wound, or calf pain, call your surgeon's office.   Constipation Prevention   Complete by: As directed    Drink plenty of fluids.  Prune juice may be helpful.  You may use a stool softener, such as Colace (over the counter) 100 mg twice a day.  Use MiraLax (over the counter) for constipation as needed.   Diet - low sodium heart healthy   Complete by: As directed    Increase activity slowly as tolerated   Complete by: As directed       Follow-up Information    Mcarthur Rossetti, MD. Go on 02/22/2021.   Specialty: Orthopedic Surgery Why: at 1:15 pm for your first post-operative appointment with Dr. Ninfa Linden. Contact information: [place] [address] [phone]        [facility] [place] Follow up.   Specialty: Home Health Services Why: agency will provide home health therapy. Contact information: [address] [place] [address] [phone]                Signed: Newt Minion 02/11/2021, 9:36 AM

## 2021-02-11 NOTE — Plan of Care (Signed)
  Problem: Pain Management: Goal: Pain level will decrease with appropriate interventions Outcome: Progressing   Problem: Activity: Goal: Ability to avoid complications of mobility impairment will improve Outcome: Progressing Goal: Ability to tolerate increased activity will improve Outcome: Progressing   

## 2021-02-11 NOTE — Plan of Care (Signed)
Patient dc'd all care plans met  

## 2021-02-11 NOTE — Progress Notes (Signed)
Physical Therapy Treatment Patient Details Name: Veronica Finley MRN: [id number] DOB: January 04, 1951 Today's Date: 02/11/2021    History of Present Illness Pt s/p L THR and with hx of bil TKR, spinal stenosis, nephrectomy and gastric bypass    PT Comments    Pt continues very motivated and progressing well with mobility.  Pt up to ambulate increased distance in hall, negotiated stairs and reviewed car transfers.  Pt eager for dc home this date.   Follow Up Recommendations  Home health PT;Follow surgeon's recommendation for DC plan and follow-up therapies     Equipment Recommendations  3in1 (PT)    Recommendations for Other Services Rehab consult     Precautions / Restrictions Precautions Precautions: Fall Restrictions Weight Bearing Restrictions: No Other Position/Activity Restrictions: WBAT    Mobility  Bed Mobility Overal bed mobility: Needs Assistance Bed Mobility: Sit to Supine       Sit to supine: Supervision   General bed mobility comments: Increased time with min cues for sequence    Transfers Overall transfer level: Needs assistance Equipment used: Rolling walker (2 wheeled) Transfers: Sit to/from Stand Sit to Stand: Modified independent (Device/Increase time)            Ambulation/Gait Ambulation/Gait assistance: Supervision Gait Distance (Feet): 300 Feet Assistive device: Rolling walker (2 wheeled) Gait Pattern/deviations: Decreased step length - right;Decreased step length - left;Shuffle;Trunk flexed;Step-to pattern;Step-through pattern     General Gait Details: min cues for position from [employer] Stairs: Yes Stairs assistance: Min guard Stair Management: No rails;Step to pattern;Forwards;With walker Number of Stairs: 2 General stair comments: single step twice with cues for sequence   Wheelchair Mobility    Modified Rankin (Stroke Patients Only)       Balance Overall balance assessment: Needs assistance Sitting-balance  support: No upper extremity supported;Feet supported Sitting balance-Leahy Scale: Good     Standing balance support: No upper extremity supported Standing balance-Leahy Scale: Fair                              Cognition Arousal/Alertness: Awake/alert Behavior During Therapy: WFL for tasks assessed/performed Overall Cognitive Status: Within Functional Limits for tasks assessed                                        Exercises Total Joint Exercises Ankle Circles/Pumps: AROM;Both;15 reps;Supine Quad Sets: AROM;10 reps;Supine;Both Heel Slides: AAROM;20 reps;Supine;Left Hip ABduction/ADduction: Left;AAROM;Supine;20 reps Long Arc Quad: AROM;Left;15 reps;Seated    General Comments        Pertinent Vitals/Pain Pain Assessment: 0-10 Pain Score: 4  Pain Location: L hip Pain Descriptors / Indicators: Tightness;Sore Pain Intervention(s): Limited activity within patient's tolerance;Monitored during session;Premedicated before session;Ice applied    Home Living                      Prior Function            PT Goals (current goals can now be found in the care plan section) Acute Rehab PT Goals Patient Stated Goal: Regain IND PT Goal Formulation: With patient Time For Goal Achievement: 02/16/21 Potential to Achieve Goals: Good Progress towards PT goals: Progressing toward goals    Frequency    7X/week      PT Plan Current plan remains appropriate    Co-evaluation  AM-PAC PT "6 Clicks" Mobility   Outcome Measure  Help needed turning from your back to your side while in a flat bed without using bedrails?: A Little Help needed moving from lying on your back to sitting on the side of a flat bed without using bedrails?: A Little Help needed moving to and from a bed to a chair (including a wheelchair)?: A Little Help needed standing up from a chair using your arms (e.g., wheelchair or bedside chair)?: None Help needed  to walk in hospital room?: None Help needed climbing 3-5 steps with a railing? : A Little 6 Click Score: 20    End of Session Equipment Utilized During Treatment: Gait belt Activity Tolerance: Patient tolerated treatment well Patient left: with call bell/phone within reach;in bed Nurse Communication: Mobility status PT Visit Diagnosis: Difficulty in walking, not elsewhere classified (R26.2)     Time: 0149-9692 PT Time Calculation (min) (ACUTE ONLY): 24 min  Charges:  $Gait Training: 8-22 mins $Therapeutic Exercise: 8-22 mins $Therapeutic Activity: 8-22 mins                     [place] Pager [phone] Office [phone]    Steffie Waggoner 02/11/2021, 11:57 AM

## 2021-02-11 NOTE — Progress Notes (Signed)
Physical Therapy Treatment Patient Details Name: Veronica Finley MRN: [id number] DOB: 04-23-1951 Today's Date: 02/11/2021    History of Present Illness Pt s/p L THR and with hx of bil TKR, spinal stenosis, nephrectomy and gastric bypass    PT Comments    Pt performed therex program with assist.  Mobility deferred on arrival of bfast.  Will follow up.  Pt hopeful for dc home this date.   Follow Up Recommendations  Home health PT;Follow surgeon's recommendation for DC plan and follow-up therapies     Equipment Recommendations  3in1 (PT)    Recommendations for Other Services       Precautions / Restrictions Precautions Precautions: Fall Restrictions Weight Bearing Restrictions: No Other Position/Activity Restrictions: WBAT    Mobility  Bed Mobility                    Transfers                    Ambulation/Gait                 Stairs             Wheelchair Mobility    Modified Rankin (Stroke Patients Only)       Balance                                            Cognition Arousal/Alertness: Awake/alert Behavior During Therapy: WFL for tasks assessed/performed Overall Cognitive Status: Within Functional Limits for tasks assessed                                        Exercises Total Joint Exercises Ankle Circles/Pumps: AROM;Both;15 reps;Supine Quad Sets: AROM;10 reps;Supine;Both Heel Slides: AAROM;20 reps;Supine;Left Hip ABduction/ADduction: Left;AAROM;Supine;20 reps Long Arc Quad: AROM;Left;15 reps;Seated    General Comments        Pertinent Vitals/Pain Pain Assessment: 0-10 Pain Score: 5  Pain Location: L hip Pain Descriptors / Indicators: Aching;Sore Pain Intervention(s): Monitored during session;Premedicated before session;Limited activity within patient's tolerance    Home Living                      Prior Function            PT Goals (current goals can now  be found in the care plan section) Acute Rehab PT Goals Patient Stated Goal: Regain IND PT Goal Formulation: With patient Time For Goal Achievement: 02/16/21 Potential to Achieve Goals: Good Progress towards PT goals: Progressing toward goals    Frequency    7X/week      PT Plan Current plan remains appropriate    Co-evaluation              AM-PAC PT "6 Clicks" Mobility   Outcome Measure  Help needed turning from your back to your side while in a flat bed without using bedrails?: A Little Help needed moving from lying on your back to sitting on the side of a flat bed without using bedrails?: A Little Help needed moving to and from a bed to a chair (including a wheelchair)?: A Little Help needed standing up from a chair using your arms (e.g., wheelchair or bedside chair)?: A Little Help needed to walk in hospital room?: A Little Help  needed climbing 3-5 steps with a railing? : A Lot 6 Click Score: 17    End of Session   Activity Tolerance: Patient tolerated treatment well Patient left: in chair;with call bell/phone within reach Nurse Communication: Mobility status PT Visit Diagnosis: Difficulty in walking, not elsewhere classified (R26.2)     Time: 5374-8270 PT Time Calculation (min) (ACUTE ONLY): 19 min  Charges:  $Therapeutic Exercise: 8-22 mins                     [place] Pager [phone] Office [phone]    Jmarion Christiano 02/11/2021, 9:09 AM

## 2021-02-11 NOTE — Progress Notes (Signed)
Patient verbalized understanding of dc instructions. Sent home with 3n1 commode (walker at home), and extra pair of TED hose stockings.

## 2021-02-11 NOTE — Plan of Care (Signed)
  Problem: Education: Goal: Knowledge of the prescribed therapeutic regimen will improve Outcome: Progressing   Problem: Pain Management: Goal: Pain level will decrease with appropriate interventions Outcome: Progressing   Problem: Activity: Goal: Ability to tolerate increased activity will improve Outcome: Progressing   

## 2021-02-12 ENCOUNTER — Telehealth: Payer: Self-pay | Admitting: *Deleted

## 2021-02-12 ENCOUNTER — Encounter (HOSPITAL_COMMUNITY): Payer: Self-pay | Admitting: Orthopaedic Surgery

## 2021-02-12 DIAGNOSIS — G4733 Obstructive sleep apnea (adult) (pediatric): Secondary | ICD-10-CM | POA: Diagnosis not present

## 2021-02-12 DIAGNOSIS — R7303 Prediabetes: Secondary | ICD-10-CM | POA: Diagnosis not present

## 2021-02-12 DIAGNOSIS — F329 Major depressive disorder, single episode, unspecified: Secondary | ICD-10-CM | POA: Diagnosis not present

## 2021-02-12 DIAGNOSIS — Z471 Aftercare following joint replacement surgery: Secondary | ICD-10-CM | POA: Diagnosis not present

## 2021-02-12 DIAGNOSIS — E039 Hypothyroidism, unspecified: Secondary | ICD-10-CM | POA: Diagnosis not present

## 2021-02-12 DIAGNOSIS — G2581 Restless legs syndrome: Secondary | ICD-10-CM | POA: Diagnosis not present

## 2021-02-12 DIAGNOSIS — M1611 Unilateral primary osteoarthritis, right hip: Secondary | ICD-10-CM | POA: Diagnosis not present

## 2021-02-12 DIAGNOSIS — M48061 Spinal stenosis, lumbar region without neurogenic claudication: Secondary | ICD-10-CM | POA: Diagnosis not present

## 2021-02-12 DIAGNOSIS — I10 Essential (primary) hypertension: Secondary | ICD-10-CM | POA: Diagnosis not present

## 2021-02-12 NOTE — Telephone Encounter (Signed)
Ortho bundle D/C call completed. 

## 2021-02-13 ENCOUNTER — Telehealth: Payer: Self-pay

## 2021-02-13 DIAGNOSIS — M48061 Spinal stenosis, lumbar region without neurogenic claudication: Secondary | ICD-10-CM | POA: Diagnosis not present

## 2021-02-13 DIAGNOSIS — G4733 Obstructive sleep apnea (adult) (pediatric): Secondary | ICD-10-CM | POA: Diagnosis not present

## 2021-02-13 DIAGNOSIS — F329 Major depressive disorder, single episode, unspecified: Secondary | ICD-10-CM | POA: Diagnosis not present

## 2021-02-13 DIAGNOSIS — I10 Essential (primary) hypertension: Secondary | ICD-10-CM | POA: Diagnosis not present

## 2021-02-13 DIAGNOSIS — Z471 Aftercare following joint replacement surgery: Secondary | ICD-10-CM | POA: Diagnosis not present

## 2021-02-13 DIAGNOSIS — G2581 Restless legs syndrome: Secondary | ICD-10-CM | POA: Diagnosis not present

## 2021-02-13 DIAGNOSIS — M1611 Unilateral primary osteoarthritis, right hip: Secondary | ICD-10-CM | POA: Diagnosis not present

## 2021-02-13 DIAGNOSIS — R7303 Prediabetes: Secondary | ICD-10-CM | POA: Diagnosis not present

## 2021-02-13 DIAGNOSIS — E039 Hypothyroidism, unspecified: Secondary | ICD-10-CM | POA: Diagnosis not present

## 2021-02-13 NOTE — Telephone Encounter (Cosign Needed)
Transition Care Management Follow-up Telephone Call  Date of discharge and from where: 02/11/21  How have you been since you were released from the hospital? good  Any questions or concerns? No  Items Reviewed:  Did the pt receive and understand the discharge instructions provided? Yes   Medications obtained and verified? Yes   Other? No   Any new allergies since your discharge? No   Dietary orders reviewed? Yes  Do you have support at home? Yes   Home Care and Equipment/Supplies: Were home health services ordered? yes If so, what is the name of the agency?   Has the agency set up a time to come to the patient's home? yes Were any new equipment or medical supplies ordered?  Yes: over the toilet seat What is the name of the medical supply agency?  Were you able to get the supplies/equipment? yes Do you have any questions related to the use of the equipment or supplies? No  Functional Questionnaire: (I = Independent and D = Dependent) ADLs: I  Bathing/Dressing- I  Meal Prep- I  Eating- I  Maintaining continence- I  Transferring/Ambulation- I  Managing Meds- I  Follow up appointments reviewed:   PCP Hospital f/u appt confirmed? No  Pt stated she will follow up with ortho  Specialist Hospital f/u appt confirmed? Yes  Scheduled to see Dr Jean Rosenthal on 02/22/21 @ 1:15.  Are transportation arrangements needed? No   If their condition worsens, is the pt aware to call PCP or go to the Emergency Dept.? Yes  Was the patient provided with contact information for the PCP's office or ED? Yes  Was to pt encouraged to call back with questions or concerns? Yes

## 2021-02-15 DIAGNOSIS — I10 Essential (primary) hypertension: Secondary | ICD-10-CM | POA: Diagnosis not present

## 2021-02-15 DIAGNOSIS — Z471 Aftercare following joint replacement surgery: Secondary | ICD-10-CM | POA: Diagnosis not present

## 2021-02-15 DIAGNOSIS — M1611 Unilateral primary osteoarthritis, right hip: Secondary | ICD-10-CM | POA: Diagnosis not present

## 2021-02-15 DIAGNOSIS — E039 Hypothyroidism, unspecified: Secondary | ICD-10-CM | POA: Diagnosis not present

## 2021-02-15 DIAGNOSIS — G4733 Obstructive sleep apnea (adult) (pediatric): Secondary | ICD-10-CM | POA: Diagnosis not present

## 2021-02-15 DIAGNOSIS — G2581 Restless legs syndrome: Secondary | ICD-10-CM | POA: Diagnosis not present

## 2021-02-15 DIAGNOSIS — F329 Major depressive disorder, single episode, unspecified: Secondary | ICD-10-CM | POA: Diagnosis not present

## 2021-02-15 DIAGNOSIS — M48061 Spinal stenosis, lumbar region without neurogenic claudication: Secondary | ICD-10-CM | POA: Diagnosis not present

## 2021-02-15 DIAGNOSIS — R7303 Prediabetes: Secondary | ICD-10-CM | POA: Diagnosis not present

## 2021-02-16 ENCOUNTER — Telehealth: Payer: Self-pay | Admitting: *Deleted

## 2021-02-16 NOTE — Telephone Encounter (Signed)
7 day Ortho bundle call completed. 

## 2021-02-18 ENCOUNTER — Other Ambulatory Visit: Payer: Self-pay | Admitting: Orthopaedic Surgery

## 2021-02-19 NOTE — Telephone Encounter (Signed)
Refill

## 2021-02-20 DIAGNOSIS — R7303 Prediabetes: Secondary | ICD-10-CM | POA: Diagnosis not present

## 2021-02-20 DIAGNOSIS — G2581 Restless legs syndrome: Secondary | ICD-10-CM | POA: Diagnosis not present

## 2021-02-20 DIAGNOSIS — E039 Hypothyroidism, unspecified: Secondary | ICD-10-CM | POA: Diagnosis not present

## 2021-02-20 DIAGNOSIS — I10 Essential (primary) hypertension: Secondary | ICD-10-CM | POA: Diagnosis not present

## 2021-02-20 DIAGNOSIS — F329 Major depressive disorder, single episode, unspecified: Secondary | ICD-10-CM | POA: Diagnosis not present

## 2021-02-20 DIAGNOSIS — M48061 Spinal stenosis, lumbar region without neurogenic claudication: Secondary | ICD-10-CM | POA: Diagnosis not present

## 2021-02-20 DIAGNOSIS — M1611 Unilateral primary osteoarthritis, right hip: Secondary | ICD-10-CM | POA: Diagnosis not present

## 2021-02-20 DIAGNOSIS — Z471 Aftercare following joint replacement surgery: Secondary | ICD-10-CM | POA: Diagnosis not present

## 2021-02-20 DIAGNOSIS — G4733 Obstructive sleep apnea (adult) (pediatric): Secondary | ICD-10-CM | POA: Diagnosis not present

## 2021-02-22 ENCOUNTER — Encounter: Payer: Self-pay | Admitting: Orthopaedic Surgery

## 2021-02-22 ENCOUNTER — Ambulatory Visit (INDEPENDENT_AMBULATORY_CARE_PROVIDER_SITE_OTHER): Payer: Medicare PPO | Admitting: Orthopaedic Surgery

## 2021-02-22 ENCOUNTER — Telehealth: Payer: Self-pay | Admitting: *Deleted

## 2021-02-22 DIAGNOSIS — M1611 Unilateral primary osteoarthritis, right hip: Secondary | ICD-10-CM | POA: Diagnosis not present

## 2021-02-22 DIAGNOSIS — E039 Hypothyroidism, unspecified: Secondary | ICD-10-CM | POA: Diagnosis not present

## 2021-02-22 DIAGNOSIS — F329 Major depressive disorder, single episode, unspecified: Secondary | ICD-10-CM | POA: Diagnosis not present

## 2021-02-22 DIAGNOSIS — Z96642 Presence of left artificial hip joint: Secondary | ICD-10-CM

## 2021-02-22 DIAGNOSIS — I10 Essential (primary) hypertension: Secondary | ICD-10-CM | POA: Diagnosis not present

## 2021-02-22 DIAGNOSIS — R7303 Prediabetes: Secondary | ICD-10-CM | POA: Diagnosis not present

## 2021-02-22 DIAGNOSIS — G4733 Obstructive sleep apnea (adult) (pediatric): Secondary | ICD-10-CM | POA: Diagnosis not present

## 2021-02-22 DIAGNOSIS — G2581 Restless legs syndrome: Secondary | ICD-10-CM | POA: Diagnosis not present

## 2021-02-22 DIAGNOSIS — M48061 Spinal stenosis, lumbar region without neurogenic claudication: Secondary | ICD-10-CM | POA: Diagnosis not present

## 2021-02-22 DIAGNOSIS — Z471 Aftercare following joint replacement surgery: Secondary | ICD-10-CM | POA: Diagnosis not present

## 2021-02-22 NOTE — Telephone Encounter (Signed)
Ortho bundle 14 day call completed. 

## 2021-02-22 NOTE — Progress Notes (Signed)
The patient is 2 weeks status post a left total hip arthroplasty.  She is doing well.  She has been on a baby aspirin twice a day.  I did tell her to go down to once a day for a week and then she can stop that since she was not on aspirin prior to surgery.  She has known severe end-stage arthritis of the right hip.  She said that she is more than ready to have [place] schedule her for a right total hip arthroplasty.  Her incision looks good on the left hip through the staples in place Steri-Strips.  There is no significant seroma.  Her ligaments are equal.  She does have some start of pain in the groin when she first gets up and this is normal.  I would like to see her back in 4 weeks to see how she doing overall.  At that visit we can work on hopefully selecting a date in later June for her right total hip arthroplasty.

## 2021-03-05 ENCOUNTER — Other Ambulatory Visit: Payer: Self-pay

## 2021-03-05 ENCOUNTER — Other Ambulatory Visit (INDEPENDENT_AMBULATORY_CARE_PROVIDER_SITE_OTHER): Payer: Medicare PPO

## 2021-03-05 DIAGNOSIS — R7989 Other specified abnormal findings of blood chemistry: Secondary | ICD-10-CM | POA: Diagnosis not present

## 2021-03-05 LAB — TSH: TSH: 1.44 u[IU]/mL (ref 0.35–4.50)

## 2021-03-16 ENCOUNTER — Telehealth: Payer: Self-pay | Admitting: *Deleted

## 2021-03-16 NOTE — Telephone Encounter (Signed)
Ortho bundle 30 day call completed. °

## 2021-03-22 ENCOUNTER — Ambulatory Visit (INDEPENDENT_AMBULATORY_CARE_PROVIDER_SITE_OTHER): Payer: Medicare PPO | Admitting: Orthopaedic Surgery

## 2021-03-22 ENCOUNTER — Encounter: Payer: Self-pay | Admitting: Orthopaedic Surgery

## 2021-03-22 DIAGNOSIS — Z96642 Presence of left artificial hip joint: Secondary | ICD-10-CM

## 2021-03-22 DIAGNOSIS — M1611 Unilateral primary osteoarthritis, right hip: Secondary | ICD-10-CM

## 2021-03-22 NOTE — Progress Notes (Signed)
The patient is now 6-week status post a left total hip arthroplasty.  The left hip is doing very well.  She has known debilitating severe arthritis of her right hip.  She has tried and failed conservative treatment for now over 12 months with the right hip.  We have her scheduled I believe for hip replacement surgery toward the end of June which will be outside of 3 months from her left hip replacement.  She says she has no issues with her left hip and there is just a little bit of pain.  Her left operative hip moves smoothly and fluidly.  Her incision is healed nicely.  Her right hip has pain and stiffness within internal and external rotation and previous x-rays confirm end-stage arthritis of the right hip with bone-on-bone wear, sclerotic changes and particular osteophytes.  At this point we will proceed with surgery at the end of June for her right total hip arthroplasty.  There is been no other acute change in her medical status and she is recovering very well from the left hip.  All question concerns were answered and addressed.  We would then see her back in 2 weeks after her right total hip arthroplasty.

## 2021-03-26 ENCOUNTER — Other Ambulatory Visit: Payer: Self-pay | Admitting: Family Medicine

## 2021-03-30 ENCOUNTER — Other Ambulatory Visit: Payer: Self-pay

## 2021-04-30 ENCOUNTER — Other Ambulatory Visit: Payer: Self-pay | Admitting: Physician Assistant

## 2021-05-04 NOTE — Patient Instructions (Addendum)
DUE TO COVID-19 ONLY ONE VISITOR IS ALLOWED TO COME WITH YOU AND STAY IN THE WAITING ROOM ONLY DURING PRE OP AND PROCEDURE DAY OF SURGERY. THE 1 VISITOR  MAY VISIT WITH YOU AFTER SURGERY IN YOUR PRIVATE ROOM DURING VISITING HOURS ONLY!  YOU NEED TO HAVE A COVID 19 TEST ON: 05/08/21 @ 9:00 AM, THIS TEST MUST BE DONE BEFORE SURGERY,  COVID TESTING SITE [place] [place] [address], IT IS ON THE RIGHT GOING OUT [address] APPROXIMATELY  2 MINUTES PAST [employer] ON THE RIGHT. ONCE YOUR COVID TEST IS COMPLETED,  PLEASE BEGIN THE QUARANTINE INSTRUCTIONS AS OUTLINED IN YOUR HANDOUT.                Veronica Finley   Your procedure is scheduled on: 05/11/21   Report to [facility] Main  Entrance   Report to SHORT STAY at : 5:15 AM     Call this number if you have problems the morning of surgery [phone]    Remember: NO SOLID FOOD AFTER MIDNIGHT THE NIGHT PRIOR TO SURGERY. NOTHING BY MOUTH EXCEPT CLEAR LIQUIDS UNTIL: 4:30 AM . PLEASE FINISH ENSURE DRINK PER SURGEON ORDER  WHICH NEEDS TO BE COMPLETED AT : 4:30 AM.   CLEAR LIQUID DIET  Foods Allowed                                                                     Foods Excluded  Coffee and tea, regular and decaf                             liquids that you cannot  Plain Jell-O any favor except red or purple                                           see through such as: Fruit ices (not with fruit pulp)                                     milk, soups, orange juice  Iced Popsicles                                    All solid food Carbonated beverages, regular and diet                                    Cranberry, grape and apple juices Sports drinks like Gatorade Lightly seasoned clear broth or consume(fat free) Sugar, honey syrup  Sample Menu Breakfast                                Lunch  Supper Cranberry juice                    Beef broth                             Chicken broth Jell-O                                     Grape juice                           Apple juice Coffee or tea                        Jell-O                                      Popsicle                                                Coffee or tea                        Coffee or tea  _____________________________________________________________________   BRUSH YOUR TEETH MORNING OF SURGERY AND RINSE YOUR MOUTH OUT, NO CHEWING GUM CANDY OR MINTS.    Take these medicines the morning of surgery with A SIP OF WATER: fluoxetine,diltiazem,levothyroxine.                               You may not have any metal on your body including hair pins and              piercings  Do not wear jewelry, make-up, lotions, powders or perfumes, deodorant             Do not wear nail polish on your fingernails.  Do not shave  48 hours prior to surgery.    Do not bring valuables to the hospital. Veronica Finley.  Contacts, dentures or bridgework may not be worn into surgery.  Leave suitcase in the car. After surgery it may be brought to your room.     Patients discharged the day of surgery will not be allowed to drive home. IF YOU ARE HAVING SURGERY AND GOING HOME THE SAME DAY, YOU MUST HAVE AN ADULT TO DRIVE YOU HOME AND BE WITH YOU FOR 24 HOURS. YOU MAY GO HOME BY TAXI OR UBER OR ORTHERWISE, BUT AN ADULT MUST ACCOMPANY YOU HOME AND STAY WITH YOU FOR 24 HOURS.  Name and phone number of your driver:  Special Instructions: N/A              Please read over the following fact sheets you were given: _____________________________________________________________________           [facility] - Preparing for Surgery Before surgery, you can play an important role.  Because skin is not sterile, your skin needs to be as free of germs as possible.  You can reduce the number of germs on your skin by washing with CHG (chlorahexidine gluconate) soap before surgery.   CHG is an antiseptic cleaner which kills germs and bonds with the skin to continue killing germs even after washing. Please DO NOT use if you have an allergy to CHG or antibacterial soaps.  If your skin becomes reddened/irritated stop using the CHG and inform your nurse when you arrive at Short Stay. Do not shave (including legs and underarms) for at least 48 hours prior to the first CHG shower.  You may shave your face/neck. Please follow these instructions carefully:  1.  Shower with CHG Soap the night before surgery and the  morning of Surgery.  2.  If you choose to wash your hair, wash your hair first as usual with your  normal  shampoo.  3.  After you shampoo, rinse your hair and body thoroughly to remove the  shampoo.                           4.  Use CHG as you would any other liquid soap.  You can apply chg directly  to the skin and wash                       Gently with a scrungie or clean washcloth.  5.  Apply the CHG Soap to your body ONLY FROM THE NECK DOWN.   Do not use on face/ open                           Wound or open sores. Avoid contact with eyes, ears mouth and genitals (private parts).                       Wash face,  Genitals (private parts) with your normal soap.             6.  Wash thoroughly, paying special attention to the area where your surgery  will be performed.  7.  Thoroughly rinse your body with warm water from the neck down.  8.  DO NOT shower/wash with your normal soap after using and rinsing off  the CHG Soap.                9.  Pat yourself dry with a clean towel.            10.  Wear clean pajamas.            11.  Place clean sheets on your bed the night of your first shower and do not  sleep with pets. Day of Surgery : Do not apply any lotions/deodorants the morning of surgery.  Please wear clean clothes to the hospital/surgery center.  FAILURE TO FOLLOW THESE INSTRUCTIONS MAY RESULT IN THE CANCELLATION OF YOUR SURGERY PATIENT  SIGNATURE_________________________________  NURSE SIGNATURE__________________________________  ________________________________________________________________________   Veronica Finley  An incentive spirometer is a tool that can help keep your lungs clear and active. This tool measures how well you are filling your lungs with each breath. Taking long deep breaths may help reverse or decrease the chance of developing breathing (pulmonary) problems (especially infection) following: A long period of time when you are unable to move or be active. BEFORE THE PROCEDURE  If the spirometer includes an indicator to show your best effort, your nurse or respiratory therapist will set it  to a desired goal. If possible, sit up straight or lean slightly forward. Try not to slouch. Hold the incentive spirometer in an upright position. INSTRUCTIONS FOR USE  Sit on the edge of your bed if possible, or sit up as far as you can in bed or on a chair. Hold the incentive spirometer in an upright position. Breathe out normally. Place the mouthpiece in your mouth and seal your lips tightly around it. Breathe in slowly and as deeply as possible, raising the piston or the ball toward the top of the column. Hold your breath for 3-5 seconds or for as long as possible. Allow the piston or ball to fall to the bottom of the column. Remove the mouthpiece from your mouth and breathe out normally. Rest for a few seconds and repeat Steps 1 through 7 at least 10 times every 1-2 hours when you are awake. Take your time and take a few normal breaths between deep breaths. The spirometer may include an indicator to show your best effort. Use the indicator as a goal to work toward during each repetition. After each set of 10 deep breaths, practice coughing to be sure your lungs are clear. If you have an incision (the cut made at the time of surgery), support your incision when coughing by placing a pillow or rolled up towels  firmly against it. Once you are able to get out of bed, walk around indoors and cough well. You may stop using the incentive spirometer when instructed by your caregiver.  RISKS AND COMPLICATIONS Take your time so you do not get dizzy or light-headed. If you are in pain, you may need to take or ask for pain medication before doing incentive spirometry. It is harder to take a deep breath if you are having pain. AFTER USE Rest and breathe slowly and easily. It can be helpful to keep track of a log of your progress. Your caregiver can provide you with a simple table to help with this. If you are using the spirometer at home, follow these instructions: [place] IF:  You are having difficultly using the spirometer. You have trouble using the spirometer as often as instructed. Your pain medication is not giving enough relief while using the spirometer. You develop fever of 100.5 F (38.1 C) or higher. SEEK IMMEDIATE MEDICAL CARE IF:  You cough up bloody sputum that had not been present before. You develop fever of 102 F (38.9 C) or greater. You develop worsening pain at or near the incision site. MAKE SURE YOU:  Understand these instructions. Will watch your condition. Will get help right away if you are not doing well or get worse. Document Released: 03/17/2007 Document Revised: 01/27/2012 Document Reviewed: 05/18/2007 [facility] Patient Information 2014 [place], [place].   ________________________________________________________________________

## 2021-05-07 ENCOUNTER — Encounter (HOSPITAL_COMMUNITY)
Admission: RE | Admit: 2021-05-07 | Discharge: 2021-05-07 | Disposition: A | Discharge status: Home / Self Care | Payer: Medicare PPO | Source: Ambulatory Visit | Attending: Orthopaedic Surgery | Admitting: Orthopaedic Surgery

## 2021-05-07 ENCOUNTER — Encounter (HOSPITAL_COMMUNITY): Payer: Self-pay

## 2021-05-07 ENCOUNTER — Other Ambulatory Visit: Payer: Self-pay

## 2021-05-07 DIAGNOSIS — Z01812 Encounter for preprocedural laboratory examination: Secondary | ICD-10-CM | POA: Diagnosis not present

## 2021-05-07 HISTORY — DX: Malignant (primary) neoplasm, unspecified: C80.1

## 2021-05-07 HISTORY — DX: Anemia, unspecified: D64.9

## 2021-05-07 LAB — BASIC METABOLIC PANEL
Anion gap: 10 (ref 5–15)
BUN: 21 mg/dL (ref 8–23)
CO2: 25 mmol/L (ref 22–32)
Calcium: 9.7 mg/dL (ref 8.9–10.3)
Chloride: 105 mmol/L (ref 98–111)
Creatinine, Ser: 1.29 mg/dL — ABNORMAL HIGH (ref 0.44–1.00)
GFR, Estimated: 45 mL/min — ABNORMAL LOW (ref >60)
Glucose, Bld: 110 mg/dL — ABNORMAL HIGH (ref 70–99)
Potassium: 4.1 mmol/L (ref 3.5–5.1)
Sodium: 140 mmol/L (ref 135–145)

## 2021-05-07 LAB — CBC
HCT: 44.2 % (ref 36.0–46.0)
Hemoglobin: 14.5 g/dL (ref 12.0–15.0)
MCH: 30.1 pg (ref 26.0–34.0)
MCHC: 32.8 g/dL (ref 30.0–36.0)
MCV: 91.9 fL (ref 80.0–100.0)
Platelets: 308 10*3/uL (ref 150–400)
RBC: 4.81 MIL/uL (ref 3.87–5.11)
RDW: 12.9 % (ref 11.5–15.5)
WBC: 8.6 10*3/uL (ref 4.0–10.5)
nRBC: 0 % (ref 0.0–0.2)

## 2021-05-07 LAB — SURGICAL PCR SCREEN
MRSA, PCR: NEGATIVE
Staphylococcus aureus: NEGATIVE

## 2021-05-07 NOTE — Progress Notes (Addendum)
COVID Vaccine Completed: Yes Date COVID Vaccine completed: 12/15/20 [place] COVID vaccine manufacturer: Pfizer      PCP - Dr. Dierdre Forth  Cardiologist -   Chest x-ray - 01/30/21 EKG - 09/15/20 EPIC Stress Test -  ECHO -  Cardiac Cath -  Pacemaker/ICD device last checked:  Sleep Study - Yes CPAP - Yes  Fasting Blood Sugar -  Checks Blood Sugar _____ times a day  Blood Thinner Instructions: Aspirin Instructions: Last Dose:  Anesthesia review: Hx: Pre-DIA.,OSA(CPAP),HTN  Patient denies shortness of breath, fever, cough and chest pain at PAT appointment   Patient verbalized understanding of instructions that were given to them at the PAT appointment. Patient was also instructed that they will need to review over the PAT instructions again at home before surgery.

## 2021-05-08 ENCOUNTER — Other Ambulatory Visit (HOSPITAL_COMMUNITY)
Admission: RE | Admit: 2021-05-08 | Discharge: 2021-05-08 | Disposition: A | Discharge status: Home / Self Care | Payer: Medicare PPO | Source: Ambulatory Visit | Attending: Orthopaedic Surgery | Admitting: Orthopaedic Surgery

## 2021-05-08 DIAGNOSIS — Z01812 Encounter for preprocedural laboratory examination: Secondary | ICD-10-CM | POA: Insufficient documentation

## 2021-05-08 DIAGNOSIS — Z20822 Contact with and (suspected) exposure to covid-19: Secondary | ICD-10-CM | POA: Insufficient documentation

## 2021-05-08 LAB — SARS CORONAVIRUS 2 (TAT 6-24 HRS): SARS Coronavirus 2: NEGATIVE

## 2021-05-08 LAB — HEMOGLOBIN A1C
Hgb A1c MFr Bld: 5.8 % — ABNORMAL HIGH (ref 4.8–5.6)
Mean Plasma Glucose: 120 mg/dL

## 2021-05-09 ENCOUNTER — Telehealth: Payer: Self-pay | Admitting: *Deleted

## 2021-05-09 NOTE — Telephone Encounter (Signed)
90 day Ortho bundle call to check status of Left total hip replacement. Completed [place], Brooke Bonito.

## 2021-05-09 NOTE — Care Plan (Signed)
Call to patient prior to surgery to discuss her upcoming Right total hip arthroplasty with Dr. Ninfa Linden on 05/11/21. She is an Ortho bundle patient through [facility] and is agreeable to case management. At time of surgery, patient will be exactly 91 days post op from her Left hip replacement done on 02/09/21. [facility] aware. She has all DME. Did discuss if she would like to have HHPT come to her home again for several visits and she is agreeable. Choice provided and referral made to [facility] ([facility] Reviewed post op care instructions. Will continue to follow for needs.

## 2021-05-09 NOTE — Telephone Encounter (Signed)
Ortho bundle pre-op call completed for Right total hip replacement.

## 2021-05-10 NOTE — H&P (Signed)
TOTAL HIP ADMISSION H&P  Patient is admitted for right total hip arthroplasty.  Subjective:  Chief Complaint: right hip pain  HPI: Veronica Finley, 70 y.o. female, has a history of pain and functional disability in the right hip(s) due to arthritis and patient has failed non-surgical conservative treatments for greater than 12 weeks to include NSAID's and/or analgesics, corticosteriod injections, flexibility and strengthening excercises, supervised PT with diminished ADL's post treatment, use of assistive devices, weight reduction as appropriate, and activity modification.  Onset of symptoms was gradual starting 2 years ago with gradually worsening course since that time.The patient noted no past surgery on the right hip(s).  Patient currently rates pain in the right hip at 10 out of 10 with activity. Patient has night pain, worsening of pain with activity and weight bearing, pain that interfers with activities of daily living, and pain with passive range of motion. Patient has evidence of subchondral sclerosis, periarticular osteophytes, and joint space narrowing by imaging studies. This condition presents safety issues increasing the risk of falls.  There is no current active infection.  Patient Active Problem List   Diagnosis Date Noted   Status post left hip replacement 02/10/2021   Status post total replacement of left hip 02/09/2021   History of renal cell carcinoma 12/14/2020   Unilateral primary osteoarthritis, left hip 12/11/2020   Unilateral primary osteoarthritis, right hip 12/11/2020   Status post nephrectomy 10/24/2020   Vitamin B12 deficiency 06/02/2020   S/P knee replacement 04/02/2019   Contact dermatitis due to chemicals 01/25/2019   OSA on CPAP 10/26/2018   Generalized hyperhidrosis 07/15/2016   Hx of laparoscopic gastric banding+ truncal vagotomy 03/24/2006 01/10/2014   Spinal stenosis of lumbar region 07/08/2013   Major depression, chronic 07/08/2013   Essential  hypertension 04/06/2012   Acquired hypothyroidism 10/08/2011   Mixed hyperlipidemia 10/08/2011   AR (allergic rhinitis) 12/20/2010   Restless legs 12/20/2010   Obesity (BMI 30-39.9) 12/17/2010   Osteoarthritis of multiple joints 12/17/2010   Past Medical History:  Diagnosis Date   Anemia    Arthritis    Cancer ([place])    COVID-19 08/28/2020   Depression    Family history of adverse reaction to anesthesia    mother and daughter have severe PONV   Hyperlipidemia    Hypertension    Hypothyroidism    Lumbar spinal stenosis 07/08/2013   OSA on CPAP 10/26/2018   Pre-diabetes    Renal mass 09/2020    Past Surgical History:  Procedure Laterality Date   GASTRIC BYPASS     KNEE ARTHROSCOPY     ROBOTIC ASSITED PARTIAL NEPHRECTOMY Left 09/22/2020   Procedure: XI ROBOTIC [place]; RESECTION OF ABDOMINAL MASS;  Surgeon: Ceasar Mons, MD;  Location: [facility];  Service: Urology;  Laterality: Left;   SPINE SURGERY     TONSILLECTOMY     TOTAL HIP ARTHROPLASTY Left 02/09/2021   Procedure: LEFT TOTAL HIP ARTHROPLASTY ANTERIOR APPROACH;  Surgeon: Mcarthur Rossetti, MD;  Location: [facility];  Service: Orthopedics;  Laterality: Left;   TOTAL KNEE ARTHROPLASTY Right 04/02/2019   Procedure: TOTAL KNEE ARTHROPLASTY;  Surgeon: Sydnee Cabal, MD;  Location: [facility];  Service: Orthopedics;  Laterality: Right;  spinal plus adductor canal   TOTAL KNEE ARTHROPLASTY Left 08/20/2019   Procedure: TOTAL KNEE ARTHROPLASTY;  Surgeon: Sydnee Cabal, MD;  Location: [facility];  Service: Orthopedics;  Laterality: Left;  with adductor canal   TUBAL LIGATION     UVULECTOMY  VENTRAL HERNIA REPAIR N/A 11/23/2020   Procedure: VENTRAL HERNIA REPAIR WITH MESH;  Surgeon: Coralie Keens, MD;  Location: [facility];  Service: General;  Laterality: N/A;  LMA    No current facility-administered medications for this encounter.   Current Outpatient Medications   Medication Sig Dispense Refill Last Dose   acetaminophen (TYLENOL) 500 MG tablet Take 1,000 mg by mouth every 6 (six) hours as needed for moderate pain.      cholecalciferol (VITAMIN D3) 25 MCG (1000 UNIT) tablet Take 1,000 Units by mouth daily.      diclofenac Sodium (VOLTAREN) 1 % GEL Apply 2 g topically daily as needed (hip pain).      diltiazem (CARDIZEM CD) 240 MG 24 hr capsule TAKE 1 CAPSULE EVERY DAY (Patient taking differently: 240 mg daily.) 90 capsule 3    docusate sodium (COLACE) 100 MG capsule Take 1 capsule (100 mg total) by mouth 2 (two) times daily. (Patient taking differently: Take 100 mg by mouth daily as needed.)      FLUoxetine (PROZAC) 20 MG capsule Take 1 capsule (20 mg total) by mouth daily. Needs office visit 90 capsule 3    levothyroxine (SYNTHROID) 150 MCG tablet Take 1 tablet (150 mcg total) by mouth daily. 90 tablet 3    losartan-hydrochlorothiazide (HYZAAR) 100-25 MG tablet Take 1 tablet by mouth daily.      rOPINIRole (REQUIP XL) 4 MG 24 hr tablet Take 1 tablet (4 mg total) by mouth at bedtime. 90 tablet 3    rosuvastatin (CRESTOR) 10 MG tablet Take 1 tablet (10 mg total) by mouth daily. 90 tablet 3    vitamin B-12 (CYANOCOBALAMIN) 1000 MCG tablet Take 1,000 mcg by mouth daily.      vitamin C (ASCORBIC ACID) 500 MG tablet Take 500 mg by mouth 3 (three) times a week.      aspirin 81 MG chewable tablet Chew 1 tablet (81 mg total) by mouth 2 (two) times daily. (Patient not taking: No sig reported) 30 tablet 0 Not Taking   methocarbamol (ROBAXIN) 500 MG tablet Take 1 tablet (500 mg total) by mouth every 6 (six) hours as needed for muscle spasms. (Patient not taking: No sig reported) 40 tablet 1 Completed Course   oxyCODONE 10 MG TABS Take 1-1.5 tablets (10-15 mg total) by mouth every 4 (four) hours as needed for severe pain (pain score 7-10). (Patient not taking: No sig reported) 30 tablet 0 Completed Course   No Known Allergies  Social History   Tobacco Use   Smoking  status: Never   Smokeless tobacco: Never  Substance Use Topics   Alcohol use: Yes    Comment: rarely    Family History  Problem Relation Age of Onset   Arthritis Mother    Atrial fibrillation Mother    Hyperlipidemia Mother    Hypertension Mother    Leukemia Father    Heart disease Father    Arthritis Sister    Arthritis Brother    Diabetes Brother    Hyperlipidemia Brother    Allergic rhinitis Neg Hx    Angioedema Neg Hx    Asthma Neg Hx    Eczema Neg Hx    Immunodeficiency Neg Hx    Urticaria Neg Hx      Review of Systems  Musculoskeletal:  Positive for gait problem.  All other systems reviewed and are negative.  Objective:  Physical Exam Vitals reviewed.  Constitutional:      Appearance: Normal appearance.  HENT:  Head: Normocephalic and atraumatic.  Eyes:     Extraocular Movements: Extraocular movements intact.     Pupils: Pupils are equal, round, and reactive to light.  Cardiovascular:     Rate and Rhythm: Normal rate and regular rhythm.  Pulmonary:     Effort: Pulmonary effort is normal.     Breath sounds: Normal breath sounds.  Abdominal:     Palpations: Abdomen is soft.  Musculoskeletal:     Cervical back: Normal range of motion and neck supple.     Right hip: Tenderness and bony tenderness present. Decreased range of motion. Decreased strength.  Neurological:     Mental Status: She is alert and oriented to person, place, and time.  Psychiatric:        Behavior: Behavior normal.    Vital signs in last 24 hours:    Labs:   Estimated body mass index is 39.05 kg/m as calculated from the following:   Height as of 05/07/21: 5' 5.6" (1.666 m).   Weight as of 05/07/21: 108.4 kg.   Imaging Review Plain radiographs demonstrate severe degenerative joint disease of the right hip(s). The bone quality appears to be excellent for age and reported activity level.      Assessment/Plan:  End stage arthritis, right hip(s)  The patient history,  physical examination, clinical judgement of the provider and imaging studies are consistent with end stage degenerative joint disease of the right hip(s) and total hip arthroplasty is deemed medically necessary. The treatment options including medical management, injection therapy, arthroscopy and arthroplasty were discussed at length. The risks and benefits of total hip arthroplasty were presented and reviewed. The risks due to aseptic loosening, infection, stiffness, dislocation/subluxation,  thromboembolic complications and other imponderables were discussed.  The patient acknowledged the explanation, agreed to proceed with the plan and consent was signed. Patient is being admitted for inpatient treatment for surgery, pain control, PT, OT, prophylactic antibiotics, VTE prophylaxis, progressive ambulation and ADL's and discharge planning.The patient is planning to be discharged home with home health services

## 2021-05-11 ENCOUNTER — Ambulatory Visit (HOSPITAL_COMMUNITY): Payer: Medicare PPO | Admitting: Anesthesiology

## 2021-05-11 ENCOUNTER — Encounter (HOSPITAL_COMMUNITY)
Admission: RE | Disposition: A | Discharge status: Home / Self Care | Payer: Self-pay | Source: Home / Self Care | Attending: Orthopaedic Surgery

## 2021-05-11 ENCOUNTER — Observation Stay (HOSPITAL_COMMUNITY): Payer: Medicare PPO

## 2021-05-11 ENCOUNTER — Encounter (HOSPITAL_COMMUNITY): Payer: Self-pay | Admitting: Orthopaedic Surgery

## 2021-05-11 ENCOUNTER — Other Ambulatory Visit: Payer: Self-pay

## 2021-05-11 ENCOUNTER — Observation Stay (HOSPITAL_COMMUNITY)
Admission: RE | Admit: 2021-05-11 | Discharge: 2021-05-13 | Disposition: A | Discharge status: Home / Self Care | Payer: Medicare PPO | Attending: Orthopaedic Surgery | Admitting: Orthopaedic Surgery

## 2021-05-11 ENCOUNTER — Ambulatory Visit (HOSPITAL_COMMUNITY): Payer: Medicare PPO

## 2021-05-11 DIAGNOSIS — Z8616 Personal history of COVID-19: Secondary | ICD-10-CM | POA: Diagnosis not present

## 2021-05-11 DIAGNOSIS — Z96653 Presence of artificial knee joint, bilateral: Secondary | ICD-10-CM | POA: Insufficient documentation

## 2021-05-11 DIAGNOSIS — E039 Hypothyroidism, unspecified: Secondary | ICD-10-CM | POA: Diagnosis not present

## 2021-05-11 DIAGNOSIS — Z7982 Long term (current) use of aspirin: Secondary | ICD-10-CM | POA: Diagnosis not present

## 2021-05-11 DIAGNOSIS — M1611 Unilateral primary osteoarthritis, right hip: Principal | ICD-10-CM | POA: Insufficient documentation

## 2021-05-11 DIAGNOSIS — Z96642 Presence of left artificial hip joint: Secondary | ICD-10-CM | POA: Insufficient documentation

## 2021-05-11 DIAGNOSIS — Z96641 Presence of right artificial hip joint: Secondary | ICD-10-CM

## 2021-05-11 DIAGNOSIS — Z79899 Other long term (current) drug therapy: Secondary | ICD-10-CM | POA: Diagnosis not present

## 2021-05-11 DIAGNOSIS — I10 Essential (primary) hypertension: Secondary | ICD-10-CM | POA: Insufficient documentation

## 2021-05-11 DIAGNOSIS — Z85528 Personal history of other malignant neoplasm of kidney: Secondary | ICD-10-CM | POA: Diagnosis not present

## 2021-05-11 DIAGNOSIS — Z471 Aftercare following joint replacement surgery: Secondary | ICD-10-CM | POA: Diagnosis not present

## 2021-05-11 DIAGNOSIS — Z419 Encounter for procedure for purposes other than remedying health state, unspecified: Secondary | ICD-10-CM

## 2021-05-11 DIAGNOSIS — J309 Allergic rhinitis, unspecified: Secondary | ICD-10-CM | POA: Diagnosis not present

## 2021-05-11 DIAGNOSIS — E782 Mixed hyperlipidemia: Secondary | ICD-10-CM | POA: Diagnosis not present

## 2021-05-11 HISTORY — PX: TOTAL HIP ARTHROPLASTY: SHX124

## 2021-05-11 LAB — TYPE AND SCREEN
ABO/RH(D): O POS
Antibody Screen: NEGATIVE

## 2021-05-11 SURGERY — ARTHROPLASTY, HIP, TOTAL, ANTERIOR APPROACH
Anesthesia: Monitor Anesthesia Care | Site: Hip | Laterality: Right

## 2021-05-11 MED ORDER — ONDANSETRON HCL 4 MG/2ML IJ SOLN: 4.0000 mg | Freq: Four times a day (QID) | INTRAMUSCULAR | Status: DC | PRN

## 2021-05-11 MED ORDER — PROPOFOL 10 MG/ML IV BOLUS
INTRAVENOUS | Status: DC | PRN
  Administered 2021-05-11: 30 mg via INTRAVENOUS

## 2021-05-11 MED ORDER — LIDOCAINE 2% (20 MG/ML) 5 ML SYRINGE
INTRAMUSCULAR | Status: DC | PRN
  Administered 2021-05-11: 20 mg via INTRAVENOUS

## 2021-05-11 MED ORDER — MIDAZOLAM HCL 2 MG/2ML IJ SOLN
INTRAMUSCULAR | Status: AC
  Filled 2021-05-11: qty 2

## 2021-05-11 MED ORDER — POVIDONE-IODINE 10 % EX SWAB
2.0000 "application " | Freq: Once | CUTANEOUS | Status: AC
  Administered 2021-05-11: 2 via TOPICAL

## 2021-05-11 MED ORDER — POLYETHYLENE GLYCOL 3350 17 G PO PACK
17.0000 g | PACK | Freq: Every day | ORAL | Status: DC | PRN
  Administered 2021-05-12: 17 g via ORAL
  Filled 2021-05-11: qty 1

## 2021-05-11 MED ORDER — SODIUM CHLORIDE 0.9 % IR SOLN
Status: DC | PRN
  Administered 2021-05-11: 1000 mL

## 2021-05-11 MED ORDER — ACETAMINOPHEN 325 MG PO TABS: 325.0000 mg | ORAL_TABLET | Freq: Four times a day (QID) | ORAL | Status: DC | PRN

## 2021-05-11 MED ORDER — LIDOCAINE 2% (20 MG/ML) 5 ML SYRINGE
INTRAMUSCULAR | Status: AC
  Filled 2021-05-11: qty 5

## 2021-05-11 MED ORDER — PHENOL 1.4 % MT LIQD: 1.0000 | OROMUCOSAL | Status: DC | PRN

## 2021-05-11 MED ORDER — 0.9 % SODIUM CHLORIDE (POUR BTL) OPTIME
TOPICAL | Status: DC | PRN
  Administered 2021-05-11: 1000 mL

## 2021-05-11 MED ORDER — ONDANSETRON HCL 4 MG/2ML IJ SOLN
INTRAMUSCULAR | Status: AC
  Filled 2021-05-11: qty 2

## 2021-05-11 MED ORDER — LOSARTAN POTASSIUM-HCTZ 100-25 MG PO TABS: 1.0000 | ORAL_TABLET | Freq: Every day | ORAL | Status: DC

## 2021-05-11 MED ORDER — FLUOXETINE HCL 20 MG PO CAPS
20.0000 mg | ORAL_CAPSULE | Freq: Every day | ORAL | Status: DC
  Administered 2021-05-12 – 2021-05-13 (×2): 20 mg via ORAL
  Filled 2021-05-11 (×2): qty 1

## 2021-05-11 MED ORDER — ONDANSETRON HCL 4 MG PO TABS
4.0000 mg | ORAL_TABLET | Freq: Four times a day (QID) | ORAL | Status: DC | PRN
  Filled 2021-05-11: qty 1

## 2021-05-11 MED ORDER — DOCUSATE SODIUM 100 MG PO CAPS
100.0000 mg | ORAL_CAPSULE | Freq: Two times a day (BID) | ORAL | Status: DC
  Administered 2021-05-11 – 2021-05-13 (×4): 100 mg via ORAL
  Filled 2021-05-11 (×4): qty 1

## 2021-05-11 MED ORDER — METHOCARBAMOL 500 MG IVPB - SIMPLE MED
INTRAVENOUS | Status: AC
  Filled 2021-05-11: qty 50

## 2021-05-11 MED ORDER — ORAL CARE MOUTH RINSE: 15.0000 mL | Freq: Once | OROMUCOSAL | Status: AC

## 2021-05-11 MED ORDER — DIPHENHYDRAMINE HCL 12.5 MG/5ML PO ELIX
12.5000 mg | ORAL_SOLUTION | ORAL | Status: DC | PRN
Start: 2021-05-11 — End: 2021-05-13

## 2021-05-11 MED ORDER — VITAMIN D 25 MCG (1000 UNIT) PO TABS
1000.0000 [IU] | ORAL_TABLET | Freq: Every day | ORAL | Status: DC
  Administered 2021-05-11 – 2021-05-13 (×3): 1000 [IU] via ORAL
  Filled 2021-05-11 (×3): qty 1

## 2021-05-11 MED ORDER — METHOCARBAMOL 500 MG PO TABS
500.0000 mg | ORAL_TABLET | Freq: Four times a day (QID) | ORAL | Status: DC | PRN
  Administered 2021-05-11 – 2021-05-13 (×5): 500 mg via ORAL
  Filled 2021-05-11 (×5): qty 1

## 2021-05-11 MED ORDER — DILTIAZEM HCL ER COATED BEADS 240 MG PO CP24
240.0000 mg | ORAL_CAPSULE | Freq: Every day | ORAL | Status: DC
  Administered 2021-05-13: 240 mg via ORAL
  Filled 2021-05-11 (×2): qty 1

## 2021-05-11 MED ORDER — DEXAMETHASONE SODIUM PHOSPHATE 10 MG/ML IJ SOLN
INTRAMUSCULAR | Status: DC | PRN
  Administered 2021-05-11: 10 mg via INTRAVENOUS

## 2021-05-11 MED ORDER — MEPIVACAINE HCL (PF) 2 % IJ SOLN
INTRAMUSCULAR | Status: DC | PRN
  Administered 2021-05-11: 3.5 mL via INTRATHECAL

## 2021-05-11 MED ORDER — PROMETHAZINE HCL 25 MG/ML IJ SOLN: 6.2500 mg | INTRAMUSCULAR | Status: DC | PRN

## 2021-05-11 MED ORDER — PANTOPRAZOLE SODIUM 40 MG PO TBEC
40.0000 mg | DELAYED_RELEASE_TABLET | Freq: Every day | ORAL | Status: DC
  Administered 2021-05-12 – 2021-05-13 (×2): 40 mg via ORAL
  Filled 2021-05-11 (×2): qty 1

## 2021-05-11 MED ORDER — SODIUM CHLORIDE 0.9 % IV SOLN: INTRAVENOUS | Status: DC

## 2021-05-11 MED ORDER — TRANEXAMIC ACID-NACL 1000-0.7 MG/100ML-% IV SOLN
1000.0000 mg | INTRAVENOUS | Status: AC
  Administered 2021-05-11: 1000 mg via INTRAVENOUS
  Filled 2021-05-11: qty 100

## 2021-05-11 MED ORDER — HYDROMORPHONE HCL 1 MG/ML IJ SOLN
INTRAMUSCULAR | Status: AC
  Administered 2021-05-11: 0.5 mg via INTRAVENOUS
  Filled 2021-05-11: qty 1

## 2021-05-11 MED ORDER — DEXAMETHASONE SODIUM PHOSPHATE 10 MG/ML IJ SOLN
INTRAMUSCULAR | Status: AC
  Filled 2021-05-11: qty 1

## 2021-05-11 MED ORDER — HYDROMORPHONE HCL 1 MG/ML IJ SOLN
0.2500 mg | INTRAMUSCULAR | Status: DC | PRN
  Administered 2021-05-11: 0.5 mg via INTRAVENOUS

## 2021-05-11 MED ORDER — LOSARTAN POTASSIUM 50 MG PO TABS
100.0000 mg | ORAL_TABLET | Freq: Every day | ORAL | Status: DC
  Administered 2021-05-12: 100 mg via ORAL
  Filled 2021-05-11 (×2): qty 2

## 2021-05-11 MED ORDER — ONDANSETRON HCL 4 MG/2ML IJ SOLN
INTRAMUSCULAR | Status: DC | PRN
  Administered 2021-05-11: 4 mg via INTRAVENOUS

## 2021-05-11 MED ORDER — OXYCODONE HCL 5 MG PO TABS: 5.0000 mg | ORAL_TABLET | Freq: Once | ORAL | Status: DC | PRN

## 2021-05-11 MED ORDER — CEFAZOLIN SODIUM-DEXTROSE 1-4 GM/50ML-% IV SOLN
1.0000 g | Freq: Four times a day (QID) | INTRAVENOUS | Status: AC
  Administered 2021-05-11 (×2): 1 g via INTRAVENOUS
  Filled 2021-05-11 (×2): qty 50

## 2021-05-11 MED ORDER — PROPOFOL 1000 MG/100ML IV EMUL
INTRAVENOUS | Status: AC
  Filled 2021-05-11: qty 100

## 2021-05-11 MED ORDER — LACTATED RINGERS IV SOLN: INTRAVENOUS | Status: DC

## 2021-05-11 MED ORDER — PROPOFOL 500 MG/50ML IV EMUL
INTRAVENOUS | Status: AC
  Filled 2021-05-11: qty 50

## 2021-05-11 MED ORDER — PROPOFOL 500 MG/50ML IV EMUL
INTRAVENOUS | Status: DC | PRN
  Administered 2021-05-11: 25 ug/kg/min via INTRAVENOUS

## 2021-05-11 MED ORDER — HYDROCHLOROTHIAZIDE 25 MG PO TABS
25.0000 mg | ORAL_TABLET | Freq: Every day | ORAL | Status: DC
  Administered 2021-05-12: 25 mg via ORAL
  Filled 2021-05-11 (×2): qty 1

## 2021-05-11 MED ORDER — HYDROMORPHONE HCL 1 MG/ML IJ SOLN: 0.5000 mg | INTRAMUSCULAR | Status: DC | PRN

## 2021-05-11 MED ORDER — CHLORHEXIDINE GLUCONATE 0.12 % MT SOLN
15.0000 mL | Freq: Once | OROMUCOSAL | Status: AC
  Administered 2021-05-11: 15 mL via OROMUCOSAL

## 2021-05-11 MED ORDER — OXYCODONE HCL 5 MG PO TABS
5.0000 mg | ORAL_TABLET | ORAL | Status: DC | PRN
  Administered 2021-05-11: 10 mg via ORAL
  Administered 2021-05-12 – 2021-05-13 (×6): 5 mg via ORAL
  Filled 2021-05-11 (×6): qty 1

## 2021-05-11 MED ORDER — OXYCODONE HCL 5 MG PO TABS
10.0000 mg | ORAL_TABLET | ORAL | Status: DC | PRN
Start: 2021-05-11 — End: 2021-05-13
  Administered 2021-05-11 (×2): 10 mg via ORAL
  Filled 2021-05-11 (×3): qty 2

## 2021-05-11 MED ORDER — BISACODYL 10 MG RE SUPP: 10.0000 mg | Freq: Every day | RECTAL | Status: DC | PRN

## 2021-05-11 MED ORDER — ASCORBIC ACID 500 MG PO TABS
500.0000 mg | ORAL_TABLET | ORAL | Status: DC
  Administered 2021-05-11: 500 mg via ORAL
  Filled 2021-05-11: qty 1

## 2021-05-11 MED ORDER — METOCLOPRAMIDE HCL 5 MG/ML IJ SOLN: 5.0000 mg | Freq: Three times a day (TID) | INTRAMUSCULAR | Status: DC | PRN

## 2021-05-11 MED ORDER — METHOCARBAMOL 500 MG IVPB - SIMPLE MED
500.0000 mg | Freq: Four times a day (QID) | INTRAVENOUS | Status: DC | PRN
  Administered 2021-05-11: 500 mg via INTRAVENOUS
  Filled 2021-05-11: qty 50

## 2021-05-11 MED ORDER — CEFAZOLIN SODIUM-DEXTROSE 2-4 GM/100ML-% IV SOLN
2.0000 g | INTRAVENOUS | Status: AC
  Administered 2021-05-11: 2 g via INTRAVENOUS
  Filled 2021-05-11: qty 100

## 2021-05-11 MED ORDER — VITAMIN B-12 1000 MCG PO TABS
1000.0000 ug | ORAL_TABLET | Freq: Every day | ORAL | Status: DC
  Administered 2021-05-12 – 2021-05-13 (×2): 1000 ug via ORAL
  Filled 2021-05-11 (×2): qty 1

## 2021-05-11 MED ORDER — METOCLOPRAMIDE HCL 5 MG PO TABS
5.0000 mg | ORAL_TABLET | Freq: Three times a day (TID) | ORAL | Status: DC | PRN
  Filled 2021-05-11: qty 2

## 2021-05-11 MED ORDER — EPHEDRINE SULFATE-NACL 50-0.9 MG/10ML-% IV SOSY
PREFILLED_SYRINGE | INTRAVENOUS | Status: DC | PRN
  Administered 2021-05-11: 10 mg via INTRAVENOUS
  Administered 2021-05-11 (×2): 5 mg via INTRAVENOUS

## 2021-05-11 MED ORDER — LEVOTHYROXINE SODIUM 75 MCG PO TABS
150.0000 ug | ORAL_TABLET | Freq: Every day | ORAL | Status: DC
  Administered 2021-05-12 – 2021-05-13 (×2): 150 ug via ORAL
  Filled 2021-05-11 (×2): qty 2

## 2021-05-11 MED ORDER — MIDAZOLAM HCL 2 MG/2ML IJ SOLN
INTRAMUSCULAR | Status: DC | PRN
  Administered 2021-05-11: 2 mg via INTRAVENOUS

## 2021-05-11 MED ORDER — FENTANYL CITRATE (PF) 100 MCG/2ML IJ SOLN
INTRAMUSCULAR | Status: DC | PRN
  Administered 2021-05-11: 50 ug via INTRAVENOUS

## 2021-05-11 MED ORDER — ROPINIROLE HCL ER 4 MG PO TB24
4.0000 mg | ORAL_TABLET | Freq: Every day | ORAL | Status: DC
  Administered 2021-05-11 – 2021-05-12 (×2): 4 mg via ORAL
  Filled 2021-05-11 (×2): qty 1

## 2021-05-11 MED ORDER — ZOLPIDEM TARTRATE 5 MG PO TABS: 5.0000 mg | ORAL_TABLET | Freq: Every evening | ORAL | Status: DC | PRN

## 2021-05-11 MED ORDER — OXYCODONE HCL 5 MG/5ML PO SOLN: 5.0000 mg | Freq: Once | ORAL | Status: DC | PRN

## 2021-05-11 MED ORDER — ASPIRIN 81 MG PO CHEW
81.0000 mg | CHEWABLE_TABLET | Freq: Two times a day (BID) | ORAL | Status: DC
  Administered 2021-05-11 – 2021-05-13 (×4): 81 mg via ORAL
  Filled 2021-05-11 (×4): qty 1

## 2021-05-11 MED ORDER — FENTANYL CITRATE (PF) 100 MCG/2ML IJ SOLN
INTRAMUSCULAR | Status: AC
  Filled 2021-05-11: qty 2

## 2021-05-11 MED ORDER — ALUM & MAG HYDROXIDE-SIMETH 200-200-20 MG/5ML PO SUSP: 30.0000 mL | ORAL | Status: DC | PRN

## 2021-05-11 MED ORDER — MENTHOL 3 MG MT LOZG: 1.0000 | LOZENGE | OROMUCOSAL | Status: DC | PRN

## 2021-05-11 MED ORDER — ROSUVASTATIN CALCIUM 10 MG PO TABS
10.0000 mg | ORAL_TABLET | Freq: Every day | ORAL | Status: DC
  Administered 2021-05-12 – 2021-05-13 (×2): 10 mg via ORAL
  Filled 2021-05-11 (×2): qty 1

## 2021-05-11 MED ORDER — STERILE WATER FOR IRRIGATION IR SOLN
Status: DC | PRN
  Administered 2021-05-11: 1000 mL

## 2021-05-11 SURGICAL SUPPLY — 46 items
ACETAB CUP W GRIPTION 54MM (Plate) ×1 IMPLANT
ACETAB CUP W/GRIPTION 54 (Plate) ×2 IMPLANT
APL SKNCLS STERI-STRIP NONHPOA (GAUZE/BANDAGES/DRESSINGS)
BAG SPEC THK2 15X12 ZIP CLS (MISCELLANEOUS)
BAG ZIPLOCK 12X15 (MISCELLANEOUS) IMPLANT
BENZOIN TINCTURE PRP APPL 2/3 (GAUZE/BANDAGES/DRESSINGS) IMPLANT
BLADE SAW SGTL 18X1.27X75 (BLADE) ×2 IMPLANT
BLADE SAW SGTL 18X1.27X75MM (BLADE) ×1
CLOSURE WOUND 1/2 X4 (GAUZE/BANDAGES/DRESSINGS)
COLLAR OFFSET CORAIL SZ 10 HIP (Stem) IMPLANT
CORAIL OFFSET COLLAR SZ 10 HIP (Stem) ×3 IMPLANT
COVER PERINEAL POST (MISCELLANEOUS) ×3 IMPLANT
COVER SURGICAL LIGHT HANDLE (MISCELLANEOUS) ×3 IMPLANT
COVER WAND RF STERILE (DRAPES) ×3 IMPLANT
CUP ACETAB W/GRIPTION 54 (Plate) IMPLANT
DRAPE FOOT SWITCH (DRAPES) ×3 IMPLANT
DRAPE STERI IOBAN 125X83 (DRAPES) ×3 IMPLANT
DRAPE U-SHAPE 47X51 STRL (DRAPES) ×6 IMPLANT
DRSG AQUACEL AG ADV 3.5X10 (GAUZE/BANDAGES/DRESSINGS) ×3 IMPLANT
DURAPREP 26ML APPLICATOR (WOUND CARE) ×3 IMPLANT
ELECT REM PT RETURN 15FT ADLT (MISCELLANEOUS) ×3 IMPLANT
GAUZE XEROFORM 1X8 LF (GAUZE/BANDAGES/DRESSINGS) ×3 IMPLANT
GLOVE SRG 8 PF TXTR STRL LF DI (GLOVE) ×2 IMPLANT
GLOVE SURG ENC MOIS LTX SZ7.5 (GLOVE) ×3 IMPLANT
GLOVE SURG LTX SZ8 (GLOVE) ×3 IMPLANT
GLOVE SURG UNDER POLY LF SZ8 (GLOVE) ×6
GOWN STRL REUS W/TWL XL LVL3 (GOWN DISPOSABLE) ×6 IMPLANT
HANDPIECE INTERPULSE COAX TIP (DISPOSABLE) ×3
HEAD M SROM 36MM PLUS 1.5 (Hips) IMPLANT
HOLDER FOLEY CATH W/STRAP (MISCELLANEOUS) ×3 IMPLANT
KIT TURNOVER KIT A (KITS) ×3 IMPLANT
LINER NEUTRAL 54X36MM PLUS 4 (Hips) ×2 IMPLANT
PACK ANTERIOR HIP CUSTOM (KITS) ×3 IMPLANT
PENCIL SMOKE EVACUATOR (MISCELLANEOUS) IMPLANT
SET HNDPC FAN SPRY TIP SCT (DISPOSABLE) ×1 IMPLANT
SROM M HEAD 36MM PLUS 1.5 (Hips) ×3 IMPLANT
STAPLER VISISTAT 35W (STAPLE) IMPLANT
STRIP CLOSURE SKIN 1/2X4 (GAUZE/BANDAGES/DRESSINGS) IMPLANT
SUT ETHIBOND NAB CT1 #1 30IN (SUTURE) ×3 IMPLANT
SUT ETHILON 2 0 PS N (SUTURE) IMPLANT
SUT MNCRL AB 4-0 PS2 18 (SUTURE) IMPLANT
SUT VIC AB 0 CT1 36 (SUTURE) ×3 IMPLANT
SUT VIC AB 1 CT1 36 (SUTURE) ×3 IMPLANT
SUT VIC AB 2-0 CT1 27 (SUTURE) ×6
SUT VIC AB 2-0 CT1 TAPERPNT 27 (SUTURE) ×2 IMPLANT
TRAY FOLEY MTR SLVR 14FR STAT (SET/KITS/TRAYS/PACK) ×2 IMPLANT

## 2021-05-11 NOTE — Anesthesia Postprocedure Evaluation (Signed)
Anesthesia Post Note  Patient: ANIELA CANIGLIA  Procedure(s) Performed: RIGHT TOTAL HIP ARTHROPLASTY ANTERIOR APPROACH (Right: Hip)     Patient location during evaluation: PACU Anesthesia Type: MAC and Spinal Level of consciousness: awake and alert and oriented Pain management: pain level controlled Vital Signs Assessment: post-procedure vital signs reviewed and stable Respiratory status: spontaneous breathing, nonlabored ventilation and respiratory function stable Cardiovascular status: blood pressure returned to baseline and stable Postop Assessment: no headache, no backache, spinal receding, patient able to bend at knees and no apparent nausea or vomiting Anesthetic complications: no   No notable events documented.  Last Vitals:  Vitals:   05/11/21 1015 05/11/21 1030  BP: (!) 80/61 109/63  Pulse: (!) 58 (!) 59  Resp: (!) 0 16  Temp:    SpO2: 95% 94%    Last Pain:  Vitals:   05/11/21 1137  TempSrc:   PainSc: 10-Worst pain ever                 Pervis Hocking

## 2021-05-11 NOTE — Interval H&P Note (Signed)
History and Physical Interval Note: The patient is here today for a right total hip replacement to treat her right hip osteoarthritis.  There has been no acute or interval change in her medical status.  See recent H&P.  The risks and benefits of surgery have been well explained and understood and informed consent was obtained.  The right hip has been marked.  05/11/2021 7:52 AM  Veronica Finley  has presented today for surgery, with the diagnosis of Osteoarthritis Right Hip.  The various methods of treatment have been discussed with the patient and family. After consideration of risks, benefits and other options for treatment, the patient has consented to  Procedure(s): RIGHT TOTAL HIP ARTHROPLASTY ANTERIOR APPROACH (Right) as a surgical intervention.  The patient's history has been reviewed, patient examined, no change in status, stable for surgery.  I have reviewed the patient's chart and labs.  Questions were answered to the patient's satisfaction.     Mcarthur Rossetti

## 2021-05-11 NOTE — Care Plan (Signed)
Ortho Bundle Case Management Note  Patient Details  Name: Veronica Finley MRN: [id number] Date of Birth: Mar 31, 1951  Call to patient prior to surgery to discuss her upcoming Right total hip arthroplasty with Dr. Ninfa Linden on 05/11/21. She is an Ortho bundle patient through [facility] and is agreeable to case management. At time of surgery, patient will be exactly 91 days post op from her Left hip replacement done on 02/09/21. [facility] aware. She has all DME. Did discuss if she would like to have HHPT come to her home again for several visits and she is agreeable. Choice provided and referral made to [facility] ([facility]). Reviewed post op care instructions. Will continue to follow for needs.                  DME Arranged:   (Patient has all needed DME. Previous hip surgery 3 months ago.) DME Agency:     HH Arranged:  PT HH Agency:  [place]  Additional Comments: Please contact me with any questions of if this plan should need to change.  Jamse Arn, RN, BSN, [employer]  [phone] 05/11/2021, 3:48 PM

## 2021-05-11 NOTE — Transfer of Care (Signed)
Immediate Anesthesia Transfer of Care Note  Patient: TAMATHA GADBOIS  Procedure(s) Performed: RIGHT TOTAL HIP ARTHROPLASTY ANTERIOR APPROACH (Right: Hip)  Patient Location: PACU  Anesthesia Type:MAC and Spinal  Level of Consciousness: awake, alert  and oriented  Airway & Oxygen Therapy: Patient Spontanous Breathing and Patient connected to face mask oxygen  Post-op Assessment: Report given to RN and Post -op Vital signs reviewed and stable  Post vital signs: Reviewed and stable  Last Vitals:  Vitals Value Taken Time  BP 109/72   Temp    Pulse 62   Resp 14   SpO2 100%     Last Pain:  Vitals:   05/11/21 0604  TempSrc: Oral  PainSc:       Patients Stated Pain Goal: 4 (52/84/13 2440)  Complications: No notable events documented.

## 2021-05-11 NOTE — Brief Op Note (Signed)
05/11/2021  9:13 AM  PATIENT:  Veronica Finley  70 y.o. female  PRE-OPERATIVE DIAGNOSIS:  Osteoarthritis Right Hip  POST-OPERATIVE DIAGNOSIS:  Osteoarthritis Right Hip  PROCEDURE:  Procedure(s): RIGHT TOTAL HIP ARTHROPLASTY ANTERIOR APPROACH (Right)  SURGEON:  Surgeon(s) and Role:    Mcarthur Rossetti, MD - Primary  PHYSICIAN ASSISTANT:  Benita Stabile, PA-C  ANESTHESIA:   spinal  EBL:  200 mL   COUNTS:  YES  DICTATION: .Other Dictation: Dictation Number [id number]  PLAN OF CARE: Admit for overnight observation  PATIENT DISPOSITION:  PACU - hemodynamically stable.   Delay start of Pharmacological VTE agent (>24hrs) due to surgical blood loss or risk of bleeding: no

## 2021-05-11 NOTE — Progress Notes (Signed)
CPAP setup done in pts room. Pt is using her home tubing and mask (nasal pillows) Pt prefer self placement. RT will continue to monitor if pt needs help.

## 2021-05-11 NOTE — Anesthesia Preprocedure Evaluation (Signed)
Anesthesia Evaluation  Patient identified by MRN, date of birth, ID band Patient awake    Reviewed: Allergy & Precautions, NPO status , Patient's Chart, lab work & pertinent test results  Airway Mallampati: III  TM Distance: >3 FB Neck ROM: Full    Dental no notable dental hx. (+) Dental Advisory Given   Pulmonary sleep apnea and Continuous Positive Airway Pressure Ventilation ,    Pulmonary exam normal breath sounds clear to auscultation       Cardiovascular hypertension, Pt. on medications Normal cardiovascular exam Rhythm:Regular Rate:Normal     Neuro/Psych PSYCHIATRIC DISORDERS Depression negative neurological ROS     GI/Hepatic Neg liver ROS, Hx lap banding   Endo/Other  Hypothyroidism Obesity BMI 36, plt 308  Renal/GU Renal diseaseCr 1.29  negative genitourinary   Musculoskeletal  (+) Arthritis , Osteoarthritis,  OA R hip Lumbar spinal stenosis- 1 prior back surgery    Abdominal   Peds negative pediatric ROS (+)  Hematology hct 44.2,    Anesthesia Other Findings   Reproductive/Obstetrics negative OB ROS                             Anesthesia Physical Anesthesia Plan  ASA: 3  Anesthesia Plan: Spinal and MAC   Post-op Pain Management:    Induction:   PONV Risk Score and Plan: 2 and Propofol infusion and TIVA  Airway Management Planned: Natural Airway and Nasal Cannula  Additional Equipment: None  Intra-op Plan:   Post-operative Plan:   Informed Consent: I have reviewed the patients History and Physical, chart, labs and discussed the procedure including the risks, benefits and alternatives for the proposed anesthesia with the patient or authorized representative who has indicated his/her understanding and acceptance.       Plan Discussed with: CRNA  Anesthesia Plan Comments: (Has had spinals for knee surgeries in past (2020), unable to place spinal for last hip  surgery a few months ago. D/w pt will attempt spinal again; did fine w/ GA last time)        Anesthesia Quick Evaluation

## 2021-05-11 NOTE — Evaluation (Signed)
Physical Therapy Evaluation Patient Details Name: Veronica Finley MRN: [id number] DOB: [birth year] Today's Date: 05/11/2021   History of Present Illness  Patient is 70 y.o. female s/p Rt THA anterior approach on 05/11/21 with anemia, OA, COVID-19, HTN, HLD, hypothyroidism, Lt THA on 02/09/21 and bil TKA in 2020.    Clinical Impression  Veronica Finley is a 70 y.o. female POD 0 s/p Rt THA. Patient reports independence with mobility at baseline. Patient is now limited by functional impairments (see PT problem list below) and requires min guard for transfers and gait with RW. Patient was able to ambulate ~60 feet with RW and min guard for safety. Patient instructed in exercise to facilitate ROM and circulation to manage edema to reduce risk of DVT. Patient will benefit from continued skilled PT interventions to address impairments and progress towards PLOF. Acute PT will follow to progress mobility and stair training in preparation for safe discharge home.     Follow Up Recommendations Follow surgeon's recommendation for DC plan and follow-up therapies;Home health PT    Equipment Recommendations  None recommended by PT    Recommendations for Other Services       Precautions / Restrictions Precautions Precautions: Fall Restrictions Weight Bearing Restrictions: No Other Position/Activity Restrictions: WBAT      Mobility  Bed Mobility Overal bed mobility: Needs Assistance Bed Mobility: Supine to Sit     Supine to sit: Min guard;HOB elevated     General bed mobility comments: pt able to use bed rail for pivot and bring Rt LE off EOB with use of UE's. guarding for safety.    Transfers Overall transfer level: Needs assistance Equipment used: Rolling walker (2 wheeled) Transfers: Sit to/from Stand Sit to Stand: Min guard         General transfer comment: pt demonstrated safe technique without cues, single UE use for power up, guarding for  safety.  Ambulation/Gait Ambulation/Gait assistance: Min guard Gait Distance (Feet): 60 Feet Assistive device: Rolling walker (2 wheeled) Gait Pattern/deviations: Step-to pattern;Step-through pattern;Decreased stride length;Antalgic Gait velocity: decr   General Gait Details: cues for proximity to RW, pt maintained throughout and progressed from step to to step through pattern with no LOB noted, mild antalgia.  Stairs            Wheelchair Mobility    Modified Rankin (Stroke Patients Only)       Balance Overall balance assessment: Mild deficits observed, not formally tested                                           Pertinent Vitals/Pain Pain Assessment: 0-10 Pain Score: 4  Pain Location: Rt hip Pain Descriptors / Indicators: Aching;Discomfort Pain Intervention(s): Limited activity within patient's tolerance;Monitored during session;Repositioned;Ice applied;Premedicated before session    Home Living Family/patient expects to be discharged to:: Private residence Living Arrangements: Non-relatives/Friends Available Help at Discharge: Family;Friend(s);Available PRN/intermittently Type of Home: House Home Access: Stairs to enter Entrance Stairs-Rails: None Entrance Stairs-Number of Steps: 1 Home Layout: One level Home Equipment: Walker - 2 wheels;Cane - single point;Bedside commode      Prior Function Level of Independence: Independent               Hand Dominance   Dominant Hand: Right    Extremity/Trunk Assessment   Upper Extremity Assessment Upper Extremity Assessment: Overall WFL for tasks assessed    Lower Extremity  Assessment Lower Extremity Assessment: Overall WFL for tasks assessed    Cervical / Trunk Assessment Cervical / Trunk Assessment: Normal  Communication   Communication: No difficulties  Cognition Arousal/Alertness: Awake/alert Behavior During Therapy: WFL for tasks assessed/performed Overall Cognitive Status:  Within Functional Limits for tasks assessed                                        General Comments      Exercises Total Joint Exercises Ankle Circles/Pumps: AROM;Both;20 reps;Seated Quad Sets: AROM;Both;10 reps;Seated Heel Slides: AROM;Right;5 reps   Assessment/Plan    PT Assessment Patient needs continued PT services  PT Problem List Decreased strength;Decreased range of motion;Decreased activity tolerance;Decreased balance;Decreased mobility;Decreased knowledge of use of DME       PT Treatment Interventions DME instruction;Gait training;Stair training;Functional mobility training;Therapeutic activities;Therapeutic exercise;Balance training;Patient/family education    PT Goals (Current goals can be found in the Care Plan section)  Acute Rehab PT Goals Patient Stated Goal: get walking more without pain PT Goal Formulation: With patient Time For Goal Achievement: 05/18/21 Potential to Achieve Goals: Good    Frequency 7X/week   Barriers to discharge        Co-evaluation               AM-PAC PT "6 Clicks" Mobility  Outcome Measure Help needed turning from your back to your side while in a flat bed without using bedrails?: None Help needed moving from lying on your back to sitting on the side of a flat bed without using bedrails?: A Little Help needed moving to and from a bed to a chair (including a wheelchair)?: A Little Help needed standing up from a chair using your arms (e.g., wheelchair or bedside chair)?: A Little Help needed to walk in hospital room?: A Little Help needed climbing 3-5 steps with a railing? : A Little 6 Click Score: 19    End of Session Equipment Utilized During Treatment: Gait belt Activity Tolerance: Patient tolerated treatment well Patient left: in chair;with call bell/phone within reach;with chair alarm set Nurse Communication: Mobility status PT Visit Diagnosis: Muscle weakness (generalized) (M62.81);Difficulty in  walking, not elsewhere classified (R26.2)    Time: 8182-9937 PT Time Calculation (min) (ACUTE ONLY): 28 min   Charges:   PT Evaluation $PT Eval Low Complexity: 1 Low PT Treatments $Gait Training: 8-22 mins        Verner Mould, DPT Acute Rehabilitation Services Office [phone] Pager [phone]   Jacques Navy 05/11/2021, 5:40 PM

## 2021-05-11 NOTE — Anesthesia Procedure Notes (Signed)
Spinal  Patient location during procedure: OR Start time: 05/11/2021 8:00 AM End time: 05/11/2021 8:05 AM Reason for block: surgical anesthesia Staffing Performed: resident/CRNA  Anesthesiologist: Pervis Hocking, DO Resident/CRNA: Genelle Bal, CRNA Preanesthetic Checklist Completed: patient identified, IV checked, site marked, risks and benefits discussed, surgical consent, monitors and equipment checked, pre-op evaluation and timeout performed Spinal Block Patient position: sitting Prep: DuraPrep Patient monitoring: heart rate, cardiac monitor, continuous pulse ox and blood pressure Approach: midline Location: L3-4 Injection technique: single-shot Needle Needle type: Pencan  Needle gauge: 24 G Needle length: 9 cm Assessment Sensory level: T4 Events: CSF return Additional Notes Pt sitting position. Spinal attempt x1, + clear, free-flowing CSF, - heme, easy to aspirate/inject, - paresthesia. Level to T6.

## 2021-05-11 NOTE — Op Note (Signed)
NAMEMARYCLAIRE, Veronica Finley MEDICAL RECORD NO: [id number] ACCOUNT NO: [id number] DATE OF BIRTH: 1951/08/13 FACILITY: [facility] LOCATION: [facility] PHYSICIAN: Lind Guest. Ninfa Linden, MD  Operative Report   DATE OF PROCEDURE: 05/11/2021  PREOPERATIVE DIAGNOSES:  Primary osteoarthritis and degenerative joint disease, right hip.  POSTOPERATIVE DIAGNOSES:  Primary osteoarthritis and degenerative joint disease, right hip.  PROCEDURE:  Right total hip arthroplasty through direct anterior approach.  IMPLANTS:  DePuy Sector Gription acetabular component, size 54, size 36+4 neutral polyethylene liner, size 10 Corail femoral component with high offset, size 36+1.5 metal hip ball.  SURGEON:  Jean Rosenthal, MD  ASSISTANT:  Erskine Emery, PA-C  ANESTHESIA:  Spinal.  ANTIBIOTICS:  2 g IV Ancef.  ESTIMATED BLOOD LOSS:  200 mL.  COMPLICATIONS:  None.  INDICATIONS:  The patient is a very active 70 year old female with debilitating arthritis involving actually both her hips.  We replaced her left hip earlier this year and she has done well with that hip.  Her right hip has been painful and has been  significant in terms of its detrimental affect on her quality of life, her mobility and activities of daily living.  She has well documented severe end-stage osteoarthritis with the right hip.  At this point, given the success of her left total hip  arthroplasty given combined with the debilitating pain, her right hip is causing her combined with osteoarthritis, so we have recommended a total hip arthroplasty and she is wishing to proceed with that as well.  Having had this before, she is fully  aware of the risk of acute blood loss anemia, nerve or vessel injury, fracture, infection, dislocation, DVT, skin and soft tissue issues and implant failure.  She understands our goals are to decrease pain, improve mobility and overall hopefully improve  quality of life.  DESCRIPTION OF PROCEDURE:  After informed  consent was obtained, appropriate right hip was marked.  She was brought to the operating room and sat up on a stretcher where spinal anesthesia was obtained.  She was laid in supine position on the stretcher.   We were able to assess her leg lengths.  Foley catheter was placed and traction boots were placed on both her feet.  Next, she was placed supine on the Hana fracture table, the perineal post in place and both legs in line skeletal traction device and no  traction applied.  Her right operative hip was prepped and draped with DuraPrep and sterile drapes.  A timeout was called and she was identified as correct patient, correct right hip.  We then made an incision just inferior and posterior to the anterior  superior iliac spine and carried this obliquely down the leg.  We dissected down tensor fascia lata muscle.  Tensor fascia was then divided longitudinally to proceed with direct anterior approach to the hip.  We identified and cauterized circumflex  vessels, identified the hip capsule, opened the hip capsule in L-type format, finding a moderate joint effusion and significant periarticular osteophytes around the lateral femoral head and neck.  We placed Cobra retractors within the joint capsule  around the medial and lateral femoral neck and made our femoral neck cut with an oscillating saw proximal to the lesser trochanter.  We completed this cut with an osteotome and used a corkscrew guide in the femoral head and removed the femoral head in  its entirety and found a wide area devoid of cartilage.  I then placed a bent Hohmann over the medial acetabular rim and removed remnants of  the acetabular labrum and other periarticular osteophytes around the acetabulum.  We then began reaming under  direct visualization from a size 43 reamer in a stepwise increments going up to a size 53 with all reamers placed under direct visualization.  The last reamer was also placed under direct fluoroscopy, so we could  obtain our depth of reaming, our  inclination and anteversion.  I then placed a real DePuy Sector Gription acetabular component size 54 and we went with a 36+4 neutral polyethylene liner based off her offset and medialization of the cup.  Attention was then turned to the femur. With the  leg externally rotated to 120 degrees, extended and adducted, we were able to place a Mueller retractor medially and Hohmann retractor behind the greater trochanter, we released the lateral joint capsule and used a box cutting osteotome to enter the  femoral canal and a rongeur to lateralize.  We then began broaching using the Corail broaching system from a size 8 going up to a size 10, which actually correlated with other side and had a nice tight fit.  We trialed a high offset femoral neck and a  36+1.5 hip ball.  We brought the leg back over in upward traction, internal rotation, reducing the pelvis.  Her preoperative x-rays look like she was on leg length wise, but we noted she is much shorter on the right side, so we had to compensate that for  making her look longer on this side.  Radiographically, she looks longer, but clinically she looks like she is ____ in terms of her leg length assessing her clinically and mechanically, she felt stable and we were pleased with the placement  radiographically.  We then dislocated the hip, removed the trial components.  We placed the real high offset femoral component size 10 Corail and the real 36+1.5 metal hip ball and again reduced this in acetabulum.  We appreciated the stability, leg  length, offset and range of motion, assessed mechanically and radiographically.  We then irrigated the soft tissue with normal saline solution and closed the joint capsule with interrupted #1 Ethibond suture.  A #1 Vicryl was used to close the tensor  fascia and 0 Vicryl was used to close the deep tissue with 2-0 Vicryl closing the subcutaneous tissue.  The skin was closed with staples.  Aquacel  dressing was applied.  She was taken off the Hana table and taken to recovery room in stable condition with  all final counts being correct.  No complications noted.  Of note, Benita Stabile, PA-C did assist during the entire case and his assistance was crucial for facilitating all aspects of this case.   NIK D: 05/11/2021 9:12:18 am T: 05/11/2021 10:22:00 am  JOB: [id number]/ [id number]

## 2021-05-12 DIAGNOSIS — I10 Essential (primary) hypertension: Secondary | ICD-10-CM | POA: Diagnosis not present

## 2021-05-12 DIAGNOSIS — Z7982 Long term (current) use of aspirin: Secondary | ICD-10-CM | POA: Diagnosis not present

## 2021-05-12 DIAGNOSIS — E039 Hypothyroidism, unspecified: Secondary | ICD-10-CM | POA: Diagnosis not present

## 2021-05-12 DIAGNOSIS — Z79899 Other long term (current) drug therapy: Secondary | ICD-10-CM | POA: Diagnosis not present

## 2021-05-12 DIAGNOSIS — Z96653 Presence of artificial knee joint, bilateral: Secondary | ICD-10-CM | POA: Diagnosis not present

## 2021-05-12 DIAGNOSIS — Z8616 Personal history of COVID-19: Secondary | ICD-10-CM | POA: Diagnosis not present

## 2021-05-12 DIAGNOSIS — M1611 Unilateral primary osteoarthritis, right hip: Secondary | ICD-10-CM | POA: Diagnosis not present

## 2021-05-12 DIAGNOSIS — Z85528 Personal history of other malignant neoplasm of kidney: Secondary | ICD-10-CM | POA: Diagnosis not present

## 2021-05-12 DIAGNOSIS — Z96642 Presence of left artificial hip joint: Secondary | ICD-10-CM | POA: Diagnosis not present

## 2021-05-12 LAB — BASIC METABOLIC PANEL
Anion gap: 5 (ref 5–15)
BUN: 17 mg/dL (ref 8–23)
CO2: 28 mmol/L (ref 22–32)
Calcium: 9.4 mg/dL (ref 8.9–10.3)
Chloride: 107 mmol/L (ref 98–111)
Creatinine, Ser: 1.28 mg/dL — ABNORMAL HIGH (ref 0.44–1.00)
GFR, Estimated: 46 mL/min — ABNORMAL LOW (ref >60)
Glucose, Bld: 151 mg/dL — ABNORMAL HIGH (ref 70–99)
Potassium: 4.7 mmol/L (ref 3.5–5.1)
Sodium: 140 mmol/L (ref 135–145)

## 2021-05-12 LAB — CBC
HCT: 36 % (ref 36.0–46.0)
Hemoglobin: 11.7 g/dL — ABNORMAL LOW (ref 12.0–15.0)
MCH: 29.8 pg (ref 26.0–34.0)
MCHC: 32.5 g/dL (ref 30.0–36.0)
MCV: 91.8 fL (ref 80.0–100.0)
Platelets: 234 10*3/uL (ref 150–400)
RBC: 3.92 MIL/uL (ref 3.87–5.11)
RDW: 13 % (ref 11.5–15.5)
WBC: 14.4 10*3/uL — ABNORMAL HIGH (ref 4.0–10.5)
nRBC: 0 % (ref 0.0–0.2)

## 2021-05-12 MED ORDER — METHOCARBAMOL 500 MG PO TABS: 500.0000 mg | ORAL_TABLET | Freq: Four times a day (QID) | ORAL | 1 refills | Status: DC | PRN

## 2021-05-12 MED ORDER — OXYCODONE HCL 5 MG PO TABS: 5.0000 mg | ORAL_TABLET | Freq: Four times a day (QID) | ORAL | 0 refills | Status: DC | PRN

## 2021-05-12 MED ORDER — ASPIRIN 81 MG PO CHEW: 81.0000 mg | CHEWABLE_TABLET | Freq: Two times a day (BID) | ORAL | 0 refills | Status: DC

## 2021-05-12 NOTE — Plan of Care (Signed)
  Problem: Coping: Goal: Level of anxiety will decrease Outcome: Progressing   Problem: Pain Managment: Goal: General experience of comfort will improve Outcome: Progressing   

## 2021-05-12 NOTE — Plan of Care (Signed)
  Problem: Education: Goal: Knowledge of General Education information will improve Description Including pain rating scale, medication(s)/side effects and non-pharmacologic comfort measures Outcome: Progressing   Problem: Health Behavior/Discharge Planning: Goal: Ability to manage health-related needs will improve Outcome: Progressing   

## 2021-05-12 NOTE — Plan of Care (Signed)
  Problem: Coping: Goal: Level of anxiety will decrease 05/12/2021 0236 by Blase Mess, RN Outcome: Progressing 05/12/2021 0234 by Blase Mess, RN Outcome: Progressing   Problem: Pain Managment: Goal: General experience of comfort will improve 05/12/2021 0236 by Blase Mess, RN Outcome: Progressing 05/12/2021 0234 by Blase Mess, RN Outcome: Progressing

## 2021-05-12 NOTE — TOC Progression Note (Signed)
Transition of Care [facility]) - Progression Note    Patient Details  Name: KANAYA GUNNARSON MRN: [id number] Date of Birth: 11-06-1951  [facility] of [facility]) CM/SW Contact  Joaquin Courts, RN Phone Number: 05/12/2021, 11:48 AM  Clinical Narrative:    Patient plans to discharge home with home health PT, [facility] to provide services.  Patient reports has rolling walker and 3in1.    Expected Discharge Plan: [place] Barriers to Discharge: No Barriers Identified  Expected Discharge Plan and Services Expected Discharge Plan: [place]   Discharge Planning Services: CM Consult Post Acute Care Choice: Home Health                   DME Arranged: N/A         HH Arranged: PT HH Agency: Licensed conveyancer spoke with at [place]: pre-arranged in md office   Social Determinants of Health (SDOH) Interventions    Readmission Risk Interventions No flowsheet data found.

## 2021-05-12 NOTE — Progress Notes (Signed)
Subjective: 1 Day Post-Op Procedure(s) (LRB): RIGHT TOTAL HIP ARTHROPLASTY ANTERIOR APPROACH (Right) Patient reports pain as moderate.    Objective: Vital signs in last 24 hours: Temp:  [97.8 F (36.6 C)-98.7 F (37.1 C)] 98.2 F (36.8 C) (06/25 0550) Pulse Rate:  [56-84] 62 (06/25 0550) Resp:  [12-18] 16 (06/25 0550) BP: (111-125)/(55-72) 115/55 (06/25 0550) SpO2:  [92 %-96 %] 96 % (06/25 0550)  Intake/Output from previous day: 06/24 0701 - 06/25 0700 In: 4492.6 [P.O.:1620; I.V.:2522.6; IV Piggyback:350] Out: 2775 [Urine:2575; Blood:200] Intake/Output this shift: Total I/O In: 240 [P.O.:240] Out: -   Recent Labs    05/12/21 0356  HGB 11.7*   Recent Labs    05/12/21 0356  WBC 14.4*  RBC 3.92  HCT 36.0  PLT 234   Recent Labs    05/12/21 0356  NA 140  K 4.7  CL 107  CO2 28  BUN 17  CREATININE 1.28*  GLUCOSE 151*  CALCIUM 9.4   No results for input(s): LABPT, INR in the last 72 hours.  Sensation intact distally Intact pulses distally Dorsiflexion/Plantar flexion intact Incision: dressing C/D/I   Assessment/Plan: 1 Day Post-Op Procedure(s) (LRB): RIGHT TOTAL HIP ARTHROPLASTY ANTERIOR APPROACH (Right) Up with therapy Plan for discharge tomorrow Discharge home with home health      Mcarthur Rossetti 05/12/2021, 10:58 AM

## 2021-05-12 NOTE — Progress Notes (Signed)
Physical Therapy Treatment Patient Details Name: Veronica Finley MRN: [id number] DOB: 01-17-1951 Today's Date: 05/12/2021    History of Present Illness Patient is 70 y.o. female s/p Rt THA anterior approach on 05/11/21, OA, COVID-19, HTN, HLD, hypothyroidism, Lt THA on 02/09/21 and bil TKA in 2020.    PT Comments    Pt progressing well with mobility and should progress to dc home tomorrow.  Follow Up Recommendations  Follow surgeon's recommendation for DC plan and follow-up therapies;Home health PT     Equipment Recommendations  None recommended by PT    Recommendations for Other Services       Precautions / Restrictions Precautions Precautions: Fall Restrictions Weight Bearing Restrictions: No RLE Weight Bearing: Weight bearing as tolerated Other Position/Activity Restrictions: WBAT    Mobility  Bed Mobility Overal bed mobility: Needs Assistance Bed Mobility: Sit to Supine       Sit to supine: Min guard   General bed mobility comments: Increased time but no physical assist    Transfers Overall transfer level: Needs assistance Equipment used: Rolling walker (2 wheeled) Transfers: Sit to/from Stand Sit to Stand: Supervision         General transfer comment: pt demonstrated safe technique without cues  Ambulation/Gait Ambulation/Gait assistance: Min guard;Supervision Gait Distance (Feet): 340 Feet Assistive device: Rolling walker (2 wheeled) Gait Pattern/deviations: Step-to pattern;Step-through pattern;Decreased stride length;Antalgic     General Gait Details: cues for proximity to RW, pt maintained throughout and progressed from step to to step through pattern with no LOB noted, mild antalgia.   Stairs Stairs: Yes Stairs assistance: Min guard Stair Management: No rails;Step to pattern;Forwards;With walker Number of Stairs: 2 General stair comments: single step twice with cues for sequence   Wheelchair Mobility    Modified Rankin (Stroke  Patients Only)       Balance Overall balance assessment: Mild deficits observed, not formally tested                                          Cognition Arousal/Alertness: Awake/alert Behavior During Therapy: WFL for tasks assessed/performed Overall Cognitive Status: Within Functional Limits for tasks assessed                                        Exercises      General Comments        Pertinent Vitals/Pain Pain Assessment: 0-10 Pain Score: 5  Pain Location: Rt hip Pain Descriptors / Indicators: Aching;Discomfort Pain Intervention(s): Limited activity within patient's tolerance;Monitored during session;Premedicated before session;Ice applied    Home Living                      Prior Function            PT Goals (current goals can now be found in the care plan section) Acute Rehab PT Goals Patient Stated Goal: get walking more without pain PT Goal Formulation: With patient Time For Goal Achievement: 05/18/21 Potential to Achieve Goals: Good Progress towards PT goals: Progressing toward goals    Frequency    7X/week      PT Plan Current plan remains appropriate    Co-evaluation              AM-PAC PT "6 Clicks" Mobility   Outcome Measure  Help needed turning from your back to your side while in a flat bed without using bedrails?: None Help needed moving from lying on your back to sitting on the side of a flat bed without using bedrails?: A Little Help needed moving to and from a bed to a chair (including a wheelchair)?: A Little Help needed standing up from a chair using your arms (e.g., wheelchair or bedside chair)?: A Little Help needed to walk in hospital room?: A Little Help needed climbing 3-5 steps with a railing? : A Little 6 Click Score: 19    End of Session Equipment Utilized During Treatment: Gait belt Activity Tolerance: Patient tolerated treatment well Patient left: in bed;with call  bell/phone within reach Nurse Communication: Mobility status PT Visit Diagnosis: Muscle weakness (generalized) (M62.81);Difficulty in walking, not elsewhere classified (R26.2)     Time: 1282-0813 PT Time Calculation (min) (ACUTE ONLY): 22 min  Charges:  $Gait Training: 8-22 mins                     [place] Pager [phone] Office [phone]    Jden Want 05/12/2021, 4:13 PM

## 2021-05-12 NOTE — Discharge Instructions (Signed)

## 2021-05-12 NOTE — Plan of Care (Signed)
  Problem: Pain Managment: Goal: General experience of comfort will improve 05/12/2021 0236 by Blase Mess, RN Outcome: Progressing 05/12/2021 0234 by Blase Mess, RN Outcome: Progressing   Problem: Safety: Goal: Ability to remain free from injury will improve 05/12/2021 0236 by Blase Mess, RN Outcome: Progressing 05/12/2021 0234 by Blase Mess, RN Outcome: Progressing

## 2021-05-12 NOTE — Progress Notes (Signed)
Physical Therapy Treatment Patient Details Name: Veronica Finley MRN: [id number] DOB: 09-Apr-1951 Today's Date: 05/12/2021    History of Present Illness Patient is 70 y.o. female s/p Rt THA anterior approach on 05/11/21, OA, COVID-19, HTN, HLD, hypothyroidism, Lt THA on 02/09/21 and bil TKA in 2020.    PT Comments    Pt very motivated and progressing well with mobility.  Pt hopeful for dc home tomorrow morning.   Follow Up Recommendations  Follow surgeon's recommendation for DC plan and follow-up therapies;Home health PT     Equipment Recommendations  None recommended by PT    Recommendations for Other Services       Precautions / Restrictions Precautions Precautions: Fall Restrictions Weight Bearing Restrictions: No RLE Weight Bearing: Weight bearing as tolerated Other Position/Activity Restrictions: WBAT    Mobility  Bed Mobility Overal bed mobility: Needs Assistance Bed Mobility: Supine to Sit     Supine to sit: Min guard;HOB elevated     General bed mobility comments: Increased time but no physical assist    Transfers Overall transfer level: Needs assistance Equipment used: Rolling walker (2 wheeled) Transfers: Sit to/from Stand Sit to Stand: Min guard;Supervision         General transfer comment: pt demonstrated safe technique without cues, single UE use for power up, guarding for safety.  Ambulation/Gait Ambulation/Gait assistance: Min guard Gait Distance (Feet): 160 Feet Assistive device: Rolling walker (2 wheeled) Gait Pattern/deviations: Step-to pattern;Step-through pattern;Decreased stride length;Antalgic Gait velocity: decr   General Gait Details: cues for proximity to RW, pt maintained throughout and progressed from step to to step through pattern with no LOB noted, mild antalgia.   Stairs             Wheelchair Mobility    Modified Rankin (Stroke Patients Only)       Balance Overall balance assessment: Mild deficits observed,  not formally tested                                          Cognition Arousal/Alertness: Awake/alert Behavior During Therapy: WFL for tasks assessed/performed Overall Cognitive Status: Within Functional Limits for tasks assessed                                        Exercises Total Joint Exercises Ankle Circles/Pumps: AROM;Both;20 reps;Seated Quad Sets: AROM;Both;10 reps;Seated Heel Slides: AROM;Right;20 reps;Supine Hip ABduction/ADduction: AAROM;Right;15 reps;Supine    General Comments        Pertinent Vitals/Pain Pain Assessment: 0-10 Pain Score: 4  Pain Location: Rt hip Pain Descriptors / Indicators: Aching;Discomfort Pain Intervention(s): Limited activity within patient's tolerance;Monitored during session;Premedicated before session;Ice applied    Home Living                      Prior Function            PT Goals (current goals can now be found in the care plan section) Acute Rehab PT Goals Patient Stated Goal: get walking more without pain PT Goal Formulation: With patient Time For Goal Achievement: 05/18/21 Potential to Achieve Goals: Good Progress towards PT goals: Progressing toward goals    Frequency    7X/week      PT Plan Current plan remains appropriate    Co-evaluation  AM-PAC PT "6 Clicks" Mobility   Outcome Measure  Help needed turning from your back to your side while in a flat bed without using bedrails?: None Help needed moving from lying on your back to sitting on the side of a flat bed without using bedrails?: A Little Help needed moving to and from a bed to a chair (including a wheelchair)?: A Little Help needed standing up from a chair using your arms (e.g., wheelchair or bedside chair)?: A Little Help needed to walk in hospital room?: A Little Help needed climbing 3-5 steps with a railing? : A Little 6 Click Score: 19    End of Session Equipment Utilized During  Treatment: Gait belt Activity Tolerance: Patient tolerated treatment well Patient left: in chair;with call bell/phone within reach;with chair alarm set Nurse Communication: Mobility status PT Visit Diagnosis: Muscle weakness (generalized) (M62.81);Difficulty in walking, not elsewhere classified (R26.2)     Time: 1916-6060 PT Time Calculation (min) (ACUTE ONLY): 28 min  Charges:  $Gait Training: 8-22 mins $Therapeutic Exercise: 8-22 mins                     [place] Pager [phone] Office [phone]    Arnold Depinto 05/12/2021, 10:15 AM

## 2021-05-13 ENCOUNTER — Encounter (HOSPITAL_COMMUNITY): Payer: Self-pay | Admitting: Orthopaedic Surgery

## 2021-05-13 DIAGNOSIS — Z8616 Personal history of COVID-19: Secondary | ICD-10-CM | POA: Diagnosis not present

## 2021-05-13 DIAGNOSIS — Z7982 Long term (current) use of aspirin: Secondary | ICD-10-CM | POA: Diagnosis not present

## 2021-05-13 DIAGNOSIS — Z79899 Other long term (current) drug therapy: Secondary | ICD-10-CM | POA: Diagnosis not present

## 2021-05-13 DIAGNOSIS — M1611 Unilateral primary osteoarthritis, right hip: Secondary | ICD-10-CM | POA: Diagnosis not present

## 2021-05-13 DIAGNOSIS — E039 Hypothyroidism, unspecified: Secondary | ICD-10-CM | POA: Diagnosis not present

## 2021-05-13 DIAGNOSIS — Z85528 Personal history of other malignant neoplasm of kidney: Secondary | ICD-10-CM | POA: Diagnosis not present

## 2021-05-13 DIAGNOSIS — Z96642 Presence of left artificial hip joint: Secondary | ICD-10-CM | POA: Diagnosis not present

## 2021-05-13 DIAGNOSIS — Z96653 Presence of artificial knee joint, bilateral: Secondary | ICD-10-CM | POA: Diagnosis not present

## 2021-05-13 DIAGNOSIS — I10 Essential (primary) hypertension: Secondary | ICD-10-CM | POA: Diagnosis not present

## 2021-05-13 NOTE — Discharge Summary (Signed)
Discharge Diagnoses:  Principal Problem:   Unilateral primary osteoarthritis, right hip Active Problems:   Status post total replacement of right hip   Surgeries: Procedure(s): RIGHT TOTAL HIP ARTHROPLASTY ANTERIOR APPROACH on 05/11/2021    Consultants:   Discharged Condition: Improved  Hospital Course: Veronica Finley is an 70 y.o. female who was admitted 05/11/2021 with a chief complaint of osteoarthritis right hip, with a final diagnosis of Osteoarthritis Right Hip.  Patient was brought to the operating room on 05/11/2021 and underwent Procedure(s): RIGHT TOTAL HIP ARTHROPLASTY ANTERIOR APPROACH.    Patient was given perioperative antibiotics:  Anti-infectives (From admission, onward)    Start     Dose/Rate Route Frequency Ordered Stop   05/11/21 1400  ceFAZolin (ANCEF) IVPB 1 g/50 mL premix        1 g 100 mL/hr over 30 Minutes Intravenous Every 6 hours 05/11/21 0951 05/11/21 2138   05/11/21 0600  ceFAZolin (ANCEF) IVPB 2g/100 mL premix        2 g 200 mL/hr over 30 Minutes Intravenous On call to O.R. 05/11/21 0507 05/11/21 8469     .  Patient was given sequential compression devices, early ambulation, and aspirin for DVT prophylaxis.  Recent vital signs: Patient Vitals for the past 24 hrs:  BP Temp Temp src Pulse Resp SpO2  05/13/21 0622 (!) 116/55 98.9 F (37.2 C) Oral 65 16 97 %  05/12/21 2158 (!) 108/49 98.9 F (37.2 C) Oral 62 16 94 %  05/12/21 1501 (!) 104/49 98.4 F (36.9 C) Oral 76 20 98 %  .  Recent laboratory studies: DG Pelvis Portable  Result Date: 05/11/2021 CLINICAL DATA:  Post RIGHT hip replacement EXAM: PORTABLE PELVIS 1-2 VIEWS COMPARISON:  Portable exam 0952 hours compared to earlier intraoperative images of 05/11/2021 FINDINGS: BILATERAL hip prostheses identified. Osseous demineralization. No fracture, dislocation, or bone destruction. Degenerative disc and facet disease changes lumbar spine with evidence of prior surgery. IMPRESSION: New RIGHT hip  prosthesis without acute complication. Electronically Signed   By: Lavonia Dana M.D.   On: 05/11/2021 11:58   DG C-Arm 1-60 Min-No Report  Result Date: 05/11/2021 Fluoroscopy was utilized by the requesting physician.  No radiographic interpretation.   DG HIP OPERATIVE UNILAT W OR W/O PELVIS RIGHT  Result Date: 05/11/2021 CLINICAL DATA:  Right total hip replacement. EXAM: OPERATIVE RIGHT HIP (WITH PELVIS IF PERFORMED) 7 VIEWS TECHNIQUE: Fluoroscopic spot image(s) were submitted for interpretation post-operatively. COMPARISON:  Apr 11, 2021. FINDINGS: Fluoro time: 17 seconds. Port radiation dose: 2.65 mGy. Seven C-arm fluoroscopic images were obtained intraoperatively and submitted for post operative interpretation. These images demonstrate postsurgical changes of right total hip arthroplasty. No unexpected findings. Partially imaged prior left total hip arthroplasty. Please see the performing provider's procedural report for further detail. IMPRESSION: Right total hip arthroplasty. Electronically Signed   By: Margaretha Sheffield MD   On: 05/11/2021 11:16    Discharge Medications:   Allergies as of 05/13/2021   No Known Allergies      Medication List     TAKE these medications    acetaminophen 500 MG tablet Commonly known as: TYLENOL Take 1,000 mg by mouth every 6 (six) hours as needed for moderate pain.   aspirin 81 MG chewable tablet Chew 1 tablet (81 mg total) by mouth 2 (two) times daily.   cholecalciferol 25 MCG (1000 UNIT) tablet Commonly known as: VITAMIN D3 Take 1,000 Units by mouth daily.   diclofenac Sodium 1 % Gel Commonly known as: VOLTAREN Apply  2 g topically daily as needed (hip pain).   FLUoxetine 20 MG capsule Commonly known as: PROZAC Take 1 capsule (20 mg total) by mouth daily. Needs office visit   levothyroxine 150 MCG tablet Commonly known as: SYNTHROID Take 1 tablet (150 mcg total) by mouth daily.   losartan-hydrochlorothiazide 100-25 MG tablet Commonly  known as: HYZAAR Take 1 tablet by mouth daily.   methocarbamol 500 MG tablet Commonly known as: ROBAXIN Take 1 tablet (500 mg total) by mouth every 6 (six) hours as needed for muscle spasms.   oxyCODONE 5 MG immediate release tablet Commonly known as: Oxy IR/ROXICODONE Take 1-2 tablets (5-10 mg total) by mouth every 6 (six) hours as needed for moderate pain (pain score 4-6).   rOPINIRole 4 MG 24 hr tablet Commonly known as: REQUIP XL Take 1 tablet (4 mg total) by mouth at bedtime.   rosuvastatin 10 MG tablet Commonly known as: CRESTOR Take 1 tablet (10 mg total) by mouth daily.   vitamin B-12 1000 MCG tablet Commonly known as: CYANOCOBALAMIN Take 1,000 mcg by mouth daily.   vitamin C 500 MG tablet Commonly known as: ASCORBIC ACID Take 500 mg by mouth 3 (three) times a week.       ASK your doctor about these medications    diltiazem 240 MG 24 hr capsule Commonly known as: CARDIZEM CD TAKE 1 CAPSULE EVERY DAY   docusate sodium 100 MG capsule Commonly known as: COLACE Take 1 capsule (100 mg total) by mouth 2 (two) times daily.               Durable Medical Equipment  (From admission, onward)           Start     Ordered   05/11/21 1128  DME 3 n 1  Once        05/11/21 1127   05/11/21 1128  DME Walker rolling  Once       Question Answer Comment  Walker: With 5 Inch Wheels   Patient needs a walker to treat with the following condition Status post total replacement of right hip      05/11/21 1127            Diagnostic Studies: DG Pelvis Portable  Result Date: 05/11/2021 CLINICAL DATA:  Post RIGHT hip replacement EXAM: PORTABLE PELVIS 1-2 VIEWS COMPARISON:  Portable exam 0952 hours compared to earlier intraoperative images of 05/11/2021 FINDINGS: BILATERAL hip prostheses identified. Osseous demineralization. No fracture, dislocation, or bone destruction. Degenerative disc and facet disease changes lumbar spine with evidence of prior surgery.  IMPRESSION: New RIGHT hip prosthesis without acute complication. Electronically Signed   By: Lavonia Dana M.D.   On: 05/11/2021 11:58   DG C-Arm 1-60 Min-No Report  Result Date: 05/11/2021 Fluoroscopy was utilized by the requesting physician.  No radiographic interpretation.   DG HIP OPERATIVE UNILAT W OR W/O PELVIS RIGHT  Result Date: 05/11/2021 CLINICAL DATA:  Right total hip replacement. EXAM: OPERATIVE RIGHT HIP (WITH PELVIS IF PERFORMED) 7 VIEWS TECHNIQUE: Fluoroscopic spot image(s) were submitted for interpretation post-operatively. COMPARISON:  Apr 11, 2021. FINDINGS: Fluoro time: 17 seconds. Port radiation dose: 2.65 mGy. Seven C-arm fluoroscopic images were obtained intraoperatively and submitted for post operative interpretation. These images demonstrate postsurgical changes of right total hip arthroplasty. No unexpected findings. Partially imaged prior left total hip arthroplasty. Please see the performing provider's procedural report for further detail. IMPRESSION: Right total hip arthroplasty. Electronically Signed   By: Margaretha Sheffield MD  On: 05/11/2021 11:16    Patient benefited maximally from their hospital stay and there were no complications.     Disposition: Discharge disposition: 01-Home or Self Care      Discharge Instructions     Call MD / Call 911   Complete by: As directed    If you experience chest pain or shortness of breath, CALL 911 and be transported to the hospital emergency room.  If you develope a fever above 101 F, pus (white drainage) or increased drainage or redness at the wound, or calf pain, call your surgeon's office.   Constipation Prevention   Complete by: As directed    Drink plenty of fluids.  Prune juice may be helpful.  You may use a stool softener, such as Colace (over the counter) 100 mg twice a day.  Use MiraLax (over the counter) for constipation as needed.   Diet - low sodium heart healthy   Complete by: As directed    Increase activity  slowly as tolerated   Complete by: As directed    Post-operative opioid taper instructions:   Complete by: As directed    POST-OPERATIVE OPIOID TAPER INSTRUCTIONS: It is important to wean off of your opioid medication as soon as possible. If you do not need pain medication after your surgery it is ok to stop day one. Opioids include: Codeine, Hydrocodone(Norco, Vicodin), Oxycodone(Percocet, oxycontin) and hydromorphone amongst others.  Long term and even short term use of opiods can cause: Increased pain response Dependence Constipation Depression Respiratory depression And more.  Withdrawal symptoms can include Flu like symptoms Nausea, vomiting And more Techniques to manage these symptoms Hydrate well Eat regular healthy meals Stay active Use relaxation techniques(deep breathing, meditating, yoga) Do Not substitute Alcohol to help with tapering If you have been on opioids for less than two weeks and do not have pain than it is ok to stop all together.  Plan to wean off of opioids This plan should start within one week post op of your joint replacement. Maintain the same interval or time between taking each dose and first decrease the dose.  Cut the total daily intake of opioids by one tablet each day Next start to increase the time between doses. The last dose that should be eliminated is the evening dose.          Follow-up Information     Mcarthur Rossetti, MD. Go on 05/24/2021.   Specialty: Orthopedic Surgery Why: at 1:30 pm for your 2 week appointment with Dr. Clarita Leber information: [place] [address] [phone]         [facility], [place] Follow up.   Specialty: [place] Why: agency will provide home health therapy Contact information: [address] [place] [address] [phone]                  Signed: Newt Minion 05/13/2021, 7:59 AM

## 2021-05-13 NOTE — Plan of Care (Signed)
  Problem: Education: Goal: Knowledge of General Education information will improve Description: Including pain rating scale, medication(s)/side effects and non-pharmacologic comfort measures Outcome: Progressing   Problem: Health Behavior/Discharge Planning: Goal: Ability to manage health-related needs will improve Outcome: Progressing   Problem: Pain Managment: Goal: General experience of comfort will improve Outcome: Progressing   

## 2021-05-13 NOTE — Progress Notes (Signed)
The patient is alert and oriented and has been seen by her physician. The orders for discharge were written. IV has have been removed. Went over discharge instructions with patient. She is being discharged via wheelchair with all of her belongings.

## 2021-05-13 NOTE — Progress Notes (Signed)
Physical Therapy Treatment Patient Details Name: Veronica Finley MRN: [id number] DOB: 12-Oct-1951 Today's Date: 05/13/2021    History of Present Illness Patient is 70 y.o. female s/p Rt THA anterior approach on 05/11/21, OA, COVID-19, HTN, HLD, hypothyroidism, Lt THA on 02/09/21 and bil TKA in 2020.    PT Comments    Pt in good spirits, progressing well with mobility and eager for dc home this date.   Follow Up Recommendations  Follow surgeon's recommendation for DC plan and follow-up therapies;Home health PT     Equipment Recommendations  None recommended by PT    Recommendations for Other Services       Precautions / Restrictions Precautions Precautions: Fall Restrictions Weight Bearing Restrictions: No RLE Weight Bearing: Weight bearing as tolerated Other Position/Activity Restrictions: WBAT    Mobility  Bed Mobility Overal bed mobility: Needs Assistance Bed Mobility: Supine to Sit     Supine to sit: Supervision     General bed mobility comments: Increased time but no physical assist    Transfers Overall transfer level: Needs assistance Equipment used: Rolling walker (2 wheeled) Transfers: Sit to/from Stand Sit to Stand: Supervision         General transfer comment: pt demonstrated safe technique without cues  Ambulation/Gait Ambulation/Gait assistance: Supervision Gait Distance (Feet): 450 Feet Assistive device: Rolling walker (2 wheeled) Gait Pattern/deviations: Step-to pattern;Step-through pattern;Decreased stride length;Antalgic Gait velocity: decr   General Gait Details: cues for proximity to RW, pt maintained throughout and progressed from step to to step through pattern with no LOB noted, mild antalgia.   Stairs         General stair comments: Pt declines to attempt - states feels comfortable with ability based on yesterday   Wheelchair Mobility    Modified Rankin (Stroke Patients Only)       Balance Overall balance assessment:  Mild deficits observed, not formally tested                                          Cognition Arousal/Alertness: Awake/alert Behavior During Therapy: WFL for tasks assessed/performed Overall Cognitive Status: Within Functional Limits for tasks assessed                                        Exercises Total Joint Exercises Ankle Circles/Pumps: AROM;Both;20 reps;Seated Quad Sets: AROM;Both;10 reps;Seated Heel Slides: AROM;Right;20 reps;Supine Hip ABduction/ADduction: AAROM;Right;15 reps;Supine Long Arc Quad: AAROM;Right;15 reps;Seated    General Comments        Pertinent Vitals/Pain Pain Assessment: 0-10 Pain Score: 6  Pain Location: Rt hip Pain Descriptors / Indicators: Aching;Discomfort;Tightness Pain Intervention(s): Limited activity within patient's tolerance;Monitored during session;Premedicated before session;Ice applied    Home Living                      Prior Function            PT Goals (current goals can now be found in the care plan section) Acute Rehab PT Goals Patient Stated Goal: get walking more without pain PT Goal Formulation: With patient Time For Goal Achievement: 05/18/21 Potential to Achieve Goals: Good Progress towards PT goals: Progressing toward goals    Frequency    7X/week      PT Plan Current plan remains appropriate    Co-evaluation  AM-PAC PT "6 Clicks" Mobility   Outcome Measure  Help needed turning from your back to your side while in a flat bed without using bedrails?: None Help needed moving from lying on your back to sitting on the side of a flat bed without using bedrails?: None Help needed moving to and from a bed to a chair (including a wheelchair)?: None Help needed standing up from a chair using your arms (e.g., wheelchair or bedside chair)?: None Help needed to walk in hospital room?: None Help needed climbing 3-5 steps with a railing? : A Little 6 Click  Score: 23    End of Session Equipment Utilized During Treatment: Gait belt Activity Tolerance: Patient tolerated treatment well Patient left: in chair;with call bell/phone within reach;with chair alarm set Nurse Communication: Mobility status PT Visit Diagnosis: Muscle weakness (generalized) (M62.81);Difficulty in walking, not elsewhere classified (R26.2)     Time: 4239-5320 PT Time Calculation (min) (ACUTE ONLY): 40 min  Charges:  $Gait Training: 8-22 mins $Therapeutic Exercise: 8-22 mins                     [place] Pager [phone] Office [phone]    Airon Sahni 05/13/2021, 10:07 AM

## 2021-05-13 NOTE — Plan of Care (Signed)
  Problem: Pain Managment: Goal: General experience of comfort will improve Outcome: Progressing   

## 2021-05-14 DIAGNOSIS — E782 Mixed hyperlipidemia: Secondary | ICD-10-CM | POA: Diagnosis not present

## 2021-05-14 DIAGNOSIS — E538 Deficiency of other specified B group vitamins: Secondary | ICD-10-CM | POA: Diagnosis not present

## 2021-05-14 DIAGNOSIS — E039 Hypothyroidism, unspecified: Secondary | ICD-10-CM | POA: Diagnosis not present

## 2021-05-14 DIAGNOSIS — G2581 Restless legs syndrome: Secondary | ICD-10-CM | POA: Diagnosis not present

## 2021-05-14 DIAGNOSIS — I10 Essential (primary) hypertension: Secondary | ICD-10-CM | POA: Diagnosis not present

## 2021-05-14 DIAGNOSIS — Z471 Aftercare following joint replacement surgery: Secondary | ICD-10-CM | POA: Diagnosis not present

## 2021-05-14 DIAGNOSIS — M48061 Spinal stenosis, lumbar region without neurogenic claudication: Secondary | ICD-10-CM | POA: Diagnosis not present

## 2021-05-14 DIAGNOSIS — G4733 Obstructive sleep apnea (adult) (pediatric): Secondary | ICD-10-CM | POA: Diagnosis not present

## 2021-05-14 DIAGNOSIS — Z96641 Presence of right artificial hip joint: Secondary | ICD-10-CM | POA: Diagnosis not present

## 2021-05-15 ENCOUNTER — Telehealth: Payer: Self-pay | Admitting: *Deleted

## 2021-05-15 DIAGNOSIS — Z96641 Presence of right artificial hip joint: Secondary | ICD-10-CM | POA: Diagnosis not present

## 2021-05-15 DIAGNOSIS — G4733 Obstructive sleep apnea (adult) (pediatric): Secondary | ICD-10-CM | POA: Diagnosis not present

## 2021-05-15 DIAGNOSIS — I10 Essential (primary) hypertension: Secondary | ICD-10-CM | POA: Diagnosis not present

## 2021-05-15 DIAGNOSIS — Z471 Aftercare following joint replacement surgery: Secondary | ICD-10-CM | POA: Diagnosis not present

## 2021-05-15 DIAGNOSIS — E538 Deficiency of other specified B group vitamins: Secondary | ICD-10-CM | POA: Diagnosis not present

## 2021-05-15 DIAGNOSIS — G2581 Restless legs syndrome: Secondary | ICD-10-CM | POA: Diagnosis not present

## 2021-05-15 DIAGNOSIS — E782 Mixed hyperlipidemia: Secondary | ICD-10-CM | POA: Diagnosis not present

## 2021-05-15 DIAGNOSIS — E039 Hypothyroidism, unspecified: Secondary | ICD-10-CM | POA: Diagnosis not present

## 2021-05-15 DIAGNOSIS — M48061 Spinal stenosis, lumbar region without neurogenic claudication: Secondary | ICD-10-CM | POA: Diagnosis not present

## 2021-05-15 NOTE — Telephone Encounter (Signed)
Ortho bundle D/C call completed. 

## 2021-05-17 DIAGNOSIS — G2581 Restless legs syndrome: Secondary | ICD-10-CM | POA: Diagnosis not present

## 2021-05-17 DIAGNOSIS — E538 Deficiency of other specified B group vitamins: Secondary | ICD-10-CM | POA: Diagnosis not present

## 2021-05-17 DIAGNOSIS — E039 Hypothyroidism, unspecified: Secondary | ICD-10-CM | POA: Diagnosis not present

## 2021-05-17 DIAGNOSIS — I10 Essential (primary) hypertension: Secondary | ICD-10-CM | POA: Diagnosis not present

## 2021-05-17 DIAGNOSIS — Z96641 Presence of right artificial hip joint: Secondary | ICD-10-CM | POA: Diagnosis not present

## 2021-05-17 DIAGNOSIS — M48061 Spinal stenosis, lumbar region without neurogenic claudication: Secondary | ICD-10-CM | POA: Diagnosis not present

## 2021-05-17 DIAGNOSIS — G4733 Obstructive sleep apnea (adult) (pediatric): Secondary | ICD-10-CM | POA: Diagnosis not present

## 2021-05-17 DIAGNOSIS — Z471 Aftercare following joint replacement surgery: Secondary | ICD-10-CM | POA: Diagnosis not present

## 2021-05-17 DIAGNOSIS — E782 Mixed hyperlipidemia: Secondary | ICD-10-CM | POA: Diagnosis not present

## 2021-05-18 ENCOUNTER — Telehealth: Payer: Self-pay | Admitting: *Deleted

## 2021-05-18 NOTE — Telephone Encounter (Signed)
Ortho bundle 7 day call to patient.

## 2021-05-22 DIAGNOSIS — I10 Essential (primary) hypertension: Secondary | ICD-10-CM | POA: Diagnosis not present

## 2021-05-22 DIAGNOSIS — Z96641 Presence of right artificial hip joint: Secondary | ICD-10-CM | POA: Diagnosis not present

## 2021-05-22 DIAGNOSIS — E782 Mixed hyperlipidemia: Secondary | ICD-10-CM | POA: Diagnosis not present

## 2021-05-22 DIAGNOSIS — G4733 Obstructive sleep apnea (adult) (pediatric): Secondary | ICD-10-CM | POA: Diagnosis not present

## 2021-05-22 DIAGNOSIS — G2581 Restless legs syndrome: Secondary | ICD-10-CM | POA: Diagnosis not present

## 2021-05-22 DIAGNOSIS — Z471 Aftercare following joint replacement surgery: Secondary | ICD-10-CM | POA: Diagnosis not present

## 2021-05-22 DIAGNOSIS — E538 Deficiency of other specified B group vitamins: Secondary | ICD-10-CM | POA: Diagnosis not present

## 2021-05-22 DIAGNOSIS — M48061 Spinal stenosis, lumbar region without neurogenic claudication: Secondary | ICD-10-CM | POA: Diagnosis not present

## 2021-05-22 DIAGNOSIS — E039 Hypothyroidism, unspecified: Secondary | ICD-10-CM | POA: Diagnosis not present

## 2021-05-24 ENCOUNTER — Encounter: Payer: Self-pay | Admitting: Orthopaedic Surgery

## 2021-05-24 ENCOUNTER — Ambulatory Visit (INDEPENDENT_AMBULATORY_CARE_PROVIDER_SITE_OTHER): Payer: Medicare PPO | Admitting: Orthopaedic Surgery

## 2021-05-24 DIAGNOSIS — Z96641 Presence of right artificial hip joint: Secondary | ICD-10-CM

## 2021-05-24 DIAGNOSIS — Z96642 Presence of left artificial hip joint: Secondary | ICD-10-CM

## 2021-05-24 NOTE — Progress Notes (Signed)
The patient is a 69 year old who is now 2 weeks tomorrow status post a right total hip arthroplasty.  We replaced her left hip.  She has bilateral knee replacements done elsewhere.  She is doing well overall.  She is walking with no assistive device and reports good strength and motion.  She is having tenderness to be expected.  The right hip incision looks good.  Steri-Strips have been placed and staples removed.  There is no significant seroma and I did try to drain fluid off the hip but was unsuccessful.  Her leg lengths are equal.  She will continue to increase her activities as comfort allows.  We will see her back in 4 weeks to see how she is doing overall but no x-rays are needed.  All questions and concerns were answered and addressed.

## 2021-05-25 DIAGNOSIS — E782 Mixed hyperlipidemia: Secondary | ICD-10-CM | POA: Diagnosis not present

## 2021-05-25 DIAGNOSIS — I10 Essential (primary) hypertension: Secondary | ICD-10-CM | POA: Diagnosis not present

## 2021-05-25 DIAGNOSIS — M48061 Spinal stenosis, lumbar region without neurogenic claudication: Secondary | ICD-10-CM | POA: Diagnosis not present

## 2021-05-25 DIAGNOSIS — E039 Hypothyroidism, unspecified: Secondary | ICD-10-CM | POA: Diagnosis not present

## 2021-05-25 DIAGNOSIS — Z471 Aftercare following joint replacement surgery: Secondary | ICD-10-CM | POA: Diagnosis not present

## 2021-05-25 DIAGNOSIS — Z96641 Presence of right artificial hip joint: Secondary | ICD-10-CM | POA: Diagnosis not present

## 2021-05-25 DIAGNOSIS — G4733 Obstructive sleep apnea (adult) (pediatric): Secondary | ICD-10-CM | POA: Diagnosis not present

## 2021-05-25 DIAGNOSIS — G2581 Restless legs syndrome: Secondary | ICD-10-CM | POA: Diagnosis not present

## 2021-05-25 DIAGNOSIS — E538 Deficiency of other specified B group vitamins: Secondary | ICD-10-CM | POA: Diagnosis not present

## 2021-06-01 ENCOUNTER — Telehealth: Payer: Self-pay | Admitting: *Deleted

## 2021-06-01 NOTE — Telephone Encounter (Signed)
Ortho bundle 14 day call completed. 

## 2021-06-08 ENCOUNTER — Other Ambulatory Visit: Payer: Self-pay | Admitting: Family Medicine

## 2021-06-13 ENCOUNTER — Ambulatory Visit: Payer: Medicare PPO | Admitting: Family Medicine

## 2021-06-15 ENCOUNTER — Telehealth: Payer: Self-pay | Admitting: *Deleted

## 2021-06-15 NOTE — Telephone Encounter (Signed)
Ortho bundle 30 day call completed. °

## 2021-06-25 ENCOUNTER — Encounter: Payer: Self-pay | Admitting: Orthopaedic Surgery

## 2021-06-25 ENCOUNTER — Ambulatory Visit (INDEPENDENT_AMBULATORY_CARE_PROVIDER_SITE_OTHER): Payer: Medicare PPO | Admitting: Orthopaedic Surgery

## 2021-06-25 DIAGNOSIS — Z96641 Presence of right artificial hip joint: Secondary | ICD-10-CM

## 2021-06-25 NOTE — Progress Notes (Signed)
The patient is now 6-week status post a right total hip arthroplasty.  She is having some pain and stiffness but overall is doing well.  Just the first couple steps give her problems.  She has had her knees replaced as well.  She is been taking some Tylenol for pain and had an increase in sciatic pain but feels like as her balance is improving this will improve.  She is walking without an assistive device.  She looks good overall.  Her right operative hip moves smoothly and fluidly.  There is some stiffness over the IT band to be expected and this should improve with time.  All questions and concerns were answered and addressed.  I can see her back in 6 months with a standing low AP pelvis and lateral of the right operative hip.  If there are issues before then she knows to let us know.

## 2021-06-28 ENCOUNTER — Encounter: Payer: Self-pay | Admitting: Family Medicine

## 2021-06-28 ENCOUNTER — Ambulatory Visit: Payer: Medicare PPO | Admitting: Family Medicine

## 2021-06-28 ENCOUNTER — Other Ambulatory Visit: Payer: Self-pay

## 2021-06-28 VITALS — BP 123/83 | HR 87 | Temp 97.4°F | Ht 65.0 in | Wt 242.0 lb

## 2021-06-28 DIAGNOSIS — Z905 Acquired absence of kidney: Secondary | ICD-10-CM | POA: Diagnosis not present

## 2021-06-28 DIAGNOSIS — E039 Hypothyroidism, unspecified: Secondary | ICD-10-CM | POA: Diagnosis not present

## 2021-06-28 DIAGNOSIS — D62 Acute posthemorrhagic anemia: Secondary | ICD-10-CM | POA: Diagnosis not present

## 2021-06-28 DIAGNOSIS — E538 Deficiency of other specified B group vitamins: Secondary | ICD-10-CM

## 2021-06-28 DIAGNOSIS — Z96641 Presence of right artificial hip joint: Secondary | ICD-10-CM

## 2021-06-28 DIAGNOSIS — N1831 Chronic kidney disease, stage 3a: Secondary | ICD-10-CM

## 2021-06-28 DIAGNOSIS — Z85528 Personal history of other malignant neoplasm of kidney: Secondary | ICD-10-CM | POA: Diagnosis not present

## 2021-06-28 DIAGNOSIS — I10 Essential (primary) hypertension: Secondary | ICD-10-CM | POA: Diagnosis not present

## 2021-06-28 DIAGNOSIS — N1832 Chronic kidney disease, stage 3b: Secondary | ICD-10-CM | POA: Insufficient documentation

## 2021-06-28 DIAGNOSIS — K5900 Constipation, unspecified: Secondary | ICD-10-CM | POA: Diagnosis not present

## 2021-06-28 NOTE — Progress Notes (Signed)
Subjective  CC:  Chief Complaint  Patient presents with   Hypertension    Taking medication as prescribed     HPI: Veronica Finley is a 70 y.o. female who presents to the office today to address the problems listed above in the chief complaint.  She is about 6 weeks out from total right hip replacmeent. Stiff but doing ok. Reviewed ortho notes/ operative /hospital course and post op visits.  Hypertension f/u: Control is good . Pt reports she is doing well. taking medications as instructed, no medication side effects noted, no TIAs, no chest pain on exertion, no dyspnea on exertion, no swelling of ankles. She denies adverse effects from his BP medications. Compliance with medication is good.  CKD and h/o renal cell carcinoma: s/p nephrectomy. Reviewed labs from June. GFR stable in the mid to high 40s. No edema.  Lab Results  Component Value Date   CREATININE 1.28 (H) 05/12/2021   BUN 17 05/12/2021   NA 140 05/12/2021   K 4.7 05/12/2021   CL 107 05/12/2021   CO2 28 05/12/2021   Low thyroid. Stable on meds. Nl energy Lab Results  Component Value Date   TSH 1.44 03/05/2021   B12 deficiency on oral supplements since February Blood loss anemia s/p transfusion: reviewed CBC from operative course. Energy is good. Did not take iron.  Constipation mild since hip replacement: on colace only. No pain. Used pain meds x 3 - 4 days only.    Assessment  1. Essential hypertension   2. History of renal cell carcinoma   3. Status post nephrectomy   4. Acquired hypothyroidism   5. Vitamin B12 deficiency   6. Stage 3a chronic kidney disease ([place])   7. Acute blood loss as cause of postoperative anemia   8. Constipation, unspecified constipation type      Plan   Hypertension f/u: BP control is well controlled. Continue  Hyperlipidemia f/u: hyzaar 100/25 daily and cardizem cd 240 daily.  CKD on arb. Avoid nephrotoxins. F/u with urology for h/o renal cell carcinoma.  S/p hip replacment:  doing well. Keep moving.  Constipation: post op and activity related. Add miralax to colace and keep active. Improve diet/fiber Low thyroid is well controlled. Continue syntrhoid 139mg daily B12 deficiency now on oral suppelemnts. Pt elects deferring recheck until cpe.  H/o anemia s/p blood transfusion. Can consider iron once constipation resolves. Recheck at cpe.   I spent a total of 42 minutes for this patient encounter. Time spent included preparation, face-to-face counseling with the patient and coordination of care, review of chart and records, and documentation of the encounter.  Education regarding management of these chronic disease states was given. Management strategies discussed on successive visits include dietary and exercise recommendations, goals of achieving and maintaining IBW, and lifestyle modifications aiming for adequate sleep and minimizing stressors.   Follow up: No follow-ups on file. Outpatient Encounter Medications as of 06/28/2021  Medication Sig   acetaminophen (TYLENOL) 500 MG tablet Take 1,000 mg by mouth every 6 (six) hours as needed for moderate pain.   cholecalciferol (VITAMIN D3) 25 MCG (1000 UNIT) tablet Take 1,000 Units by mouth daily.   diltiazem (CARDIZEM CD) 240 MG 24 hr capsule TAKE 1 CAPSULE EVERY DAY (Patient taking differently: 240 mg daily.)   FLUoxetine (PROZAC) 20 MG capsule TAKE 1 CAPSULE EVERY DAY (NEED MD APPOINTMENT)   levothyroxine (SYNTHROID) 150 MCG tablet Take 1 tablet (150 mcg total) by mouth daily.   losartan-hydrochlorothiazide (HYZAAR) 100-25  MG tablet TAKE 1 TABLET EVERY DAY   methocarbamol (ROBAXIN) 500 MG tablet Take 1 tablet (500 mg total) by mouth every 6 (six) hours as needed for muscle spasms.   rOPINIRole (REQUIP XL) 4 MG 24 hr tablet TAKE 1 TABLET AT BEDTIME   rosuvastatin (CRESTOR) 10 MG tablet Take 1 tablet (10 mg total) by mouth daily.   vitamin B-12 (CYANOCOBALAMIN) 1000 MCG tablet Take 1,000 mcg by mouth daily.   vitamin C  (ASCORBIC ACID) 500 MG tablet Take 500 mg by mouth 3 (three) times a week.   [DISCONTINUED] docusate sodium (COLACE) 100 MG capsule Take 1 capsule (100 mg total) by mouth 2 (two) times daily. (Patient taking differently: Take 100 mg by mouth daily as needed.)   [DISCONTINUED] aspirin 81 MG chewable tablet Chew 1 tablet (81 mg total) by mouth 2 (two) times daily.   [DISCONTINUED] diclofenac Sodium (VOLTAREN) 1 % GEL Apply 2 g topically daily as needed (hip pain). (Patient not taking: Reported on 06/28/2021)   [DISCONTINUED] oxyCODONE (OXY IR/ROXICODONE) 5 MG immediate release tablet Take 1-2 tablets (5-10 mg total) by mouth every 6 (six) hours as needed for moderate pain (pain score 4-6).   No facility-administered encounter medications on file as of 06/28/2021.      BP Readings from Last 3 Encounters:  06/28/21 123/83  05/13/21 (!) 116/55  05/07/21 128/71   Wt Readings from Last 3 Encounters:  06/28/21 242 lb (109.8 kg)  05/11/21 239 lb (108.4 kg)  05/07/21 239 lb (108.4 kg)    Lab Results  Component Value Date   CHOL 142 12/14/2020   CHOL 122 05/17/2020   CHOL 118 10/26/2018   Lab Results  Component Value Date   HDL 45.20 12/14/2020   HDL 47.40 05/17/2020   HDL 49.90 10/26/2018   Lab Results  Component Value Date   LDLCALC 63 12/14/2020   LDLCALC 57 05/17/2020   LDLCALC 50 10/26/2018   Lab Results  Component Value Date   TRIG 171.0 (H) 12/14/2020   TRIG 91.0 05/17/2020   TRIG 90.0 10/26/2018   Lab Results  Component Value Date   CHOLHDL 3 12/14/2020   CHOLHDL 3 05/17/2020   CHOLHDL 2 10/26/2018   No results found for: LDLDIRECT Lab Results  Component Value Date   CREATININE 1.28 (H) 05/12/2021   BUN 17 05/12/2021   NA 140 05/12/2021   K 4.7 05/12/2021   CL 107 05/12/2021   CO2 28 05/12/2021    The 10-year ASCVD risk score Mikey Bussing DC Jr., et al., 2013) is: 16.3%   Values used to calculate the score:     Age: 84 years     Sex: Female     Is Non-Hispanic  African American: No     Diabetic: Yes     Tobacco smoker: No     Systolic Blood Pressure: [id number] mmHg     Is BP treated: Yes     HDL Cholesterol: 45.2 mg/dL     Total Cholesterol: 142 mg/dL  I reviewed the patients updated PMH, FH, and SocHx.    Patient Active Problem List   Diagnosis Date Noted   History of renal cell carcinoma 12/14/2020    Priority: High   Status post nephrectomy 10/24/2020    Priority: High   OSA on CPAP 10/26/2018    Priority: High   Hx of laparoscopic gastric banding+ truncal vagotomy 03/24/2006 01/10/2014    Priority: High   Spinal stenosis of lumbar region 07/08/2013    Priority:  High   Major depression, chronic 07/08/2013    Priority: High   Essential hypertension 04/06/2012    Priority: High   Acquired hypothyroidism 10/08/2011    Priority: High   Mixed hyperlipidemia 10/08/2011    Priority: High   Obesity (BMI 30-39.9) 12/17/2010    Priority: High   S/P knee replacement 04/02/2019    Priority: Medium   Restless legs 12/20/2010    Priority: Medium   Osteoarthritis of multiple joints 12/17/2010    Priority: Medium   Vitamin B12 deficiency 06/02/2020    Priority: Low   Contact dermatitis due to chemicals 01/25/2019    Priority: Low   Generalized hyperhidrosis 07/15/2016    Priority: Low   AR (allergic rhinitis) 12/20/2010    Priority: Low   Stage 3a chronic kidney disease ([place]) 06/28/2021   Status post total replacement of right hip 05/11/2021   Status post left hip replacement 02/10/2021   Status post total replacement of left hip 02/09/2021   Unilateral primary osteoarthritis, left hip 12/11/2020   Unilateral primary osteoarthritis, right hip 12/11/2020    Allergies: Patient has no known allergies.  Social History: Patient  reports that she has never smoked. She has never used smokeless tobacco. She reports current alcohol use. She reports that she does not use drugs.  Current Meds  Medication Sig   acetaminophen (TYLENOL) 500 MG  tablet Take 1,000 mg by mouth every 6 (six) hours as needed for moderate pain.   cholecalciferol (VITAMIN D3) 25 MCG (1000 UNIT) tablet Take 1,000 Units by mouth daily.   diltiazem (CARDIZEM CD) 240 MG 24 hr capsule TAKE 1 CAPSULE EVERY DAY (Patient taking differently: 240 mg daily.)   FLUoxetine (PROZAC) 20 MG capsule TAKE 1 CAPSULE EVERY DAY (NEED MD APPOINTMENT)   levothyroxine (SYNTHROID) 150 MCG tablet Take 1 tablet (150 mcg total) by mouth daily.   losartan-hydrochlorothiazide (HYZAAR) 100-25 MG tablet TAKE 1 TABLET EVERY DAY   methocarbamol (ROBAXIN) 500 MG tablet Take 1 tablet (500 mg total) by mouth every 6 (six) hours as needed for muscle spasms.   rOPINIRole (REQUIP XL) 4 MG 24 hr tablet TAKE 1 TABLET AT BEDTIME   rosuvastatin (CRESTOR) 10 MG tablet Take 1 tablet (10 mg total) by mouth daily.   vitamin B-12 (CYANOCOBALAMIN) 1000 MCG tablet Take 1,000 mcg by mouth daily.   vitamin C (ASCORBIC ACID) 500 MG tablet Take 500 mg by mouth 3 (three) times a week.   [DISCONTINUED] docusate sodium (COLACE) 100 MG capsule Take 1 capsule (100 mg total) by mouth 2 (two) times daily. (Patient taking differently: Take 100 mg by mouth daily as needed.)    Review of Systems: Cardiovascular: negative for chest pain, palpitations, leg swelling, orthopnea Respiratory: negative for SOB, wheezing or persistent cough Gastrointestinal: negative for abdominal pain Genitourinary: negative for dysuria or gross hematuria  Objective  Vitals: BP 123/83   Pulse 87   Temp (!) 97.4 F (36.3 C) (Temporal)   Ht '5\' 5"'$  (1.651 m)   Wt 242 lb (109.8 kg)   SpO2 97%   BMI 40.27 kg/m  General: no acute distress  Psych:  Alert and oriented, normal mood and affect HEENT:  Normocephalic, atraumatic, supple neck  Cardiovascular:  RRR without murmur. no edema Respiratory:  Good breath sounds bilaterally, CTAB with normal respiratory effort Gait: mild limp  Commons side effects, risks, benefits, and alternatives for  medications and treatment plan prescribed today were discussed, and the patient expressed understanding of the given instructions. Patient is  instructed to call or message via MyChart if he/she has any questions or concerns regarding our treatment plan. No barriers to understanding were identified. We discussed Red Flag symptoms and signs in detail. Patient expressed understanding regarding what to do in case of urgent or emergency type symptoms.  Medication list was reconciled, printed and provided to the patient in AVS. Patient instructions and summary information was reviewed with the patient as documented in the AVS. This note was prepared with assistance of Dragon voice recognition software. Occasional wrong-word or sound-a-like substitutions may have occurred due to the inherent limitations of voice recognition software  This visit occurred during the SARS-CoV-2 public health emergency.  Safety protocols were in place, including screening questions prior to the visit, additional usage of staff PPE, and extensive cleaning of exam room while observing appropriate contact time as indicated for disinfecting solutions.

## 2021-07-02 DIAGNOSIS — G4733 Obstructive sleep apnea (adult) (pediatric): Secondary | ICD-10-CM | POA: Diagnosis not present

## 2021-07-31 DIAGNOSIS — E785 Hyperlipidemia, unspecified: Secondary | ICD-10-CM | POA: Diagnosis not present

## 2021-07-31 DIAGNOSIS — I10 Essential (primary) hypertension: Secondary | ICD-10-CM | POA: Diagnosis not present

## 2021-07-31 DIAGNOSIS — G20 Parkinson's disease: Secondary | ICD-10-CM | POA: Diagnosis not present

## 2021-07-31 DIAGNOSIS — F324 Major depressive disorder, single episode, in partial remission: Secondary | ICD-10-CM | POA: Diagnosis not present

## 2021-07-31 DIAGNOSIS — Z85528 Personal history of other malignant neoplasm of kidney: Secondary | ICD-10-CM | POA: Diagnosis not present

## 2021-07-31 DIAGNOSIS — E039 Hypothyroidism, unspecified: Secondary | ICD-10-CM | POA: Diagnosis not present

## 2021-08-20 ENCOUNTER — Other Ambulatory Visit: Payer: Self-pay | Admitting: Family Medicine

## 2021-10-17 ENCOUNTER — Telehealth: Payer: Self-pay | Admitting: *Deleted

## 2021-10-17 NOTE — Telephone Encounter (Signed)
Ortho bundle 90 day call and survey completed. 

## 2021-11-01 ENCOUNTER — Other Ambulatory Visit: Payer: Self-pay | Admitting: Family Medicine

## 2021-11-28 ENCOUNTER — Encounter: Payer: Self-pay | Admitting: Family Medicine

## 2021-11-29 DIAGNOSIS — E039 Hypothyroidism, unspecified: Secondary | ICD-10-CM | POA: Diagnosis not present

## 2021-11-29 DIAGNOSIS — G2581 Restless legs syndrome: Secondary | ICD-10-CM | POA: Diagnosis not present

## 2021-11-29 DIAGNOSIS — I10 Essential (primary) hypertension: Secondary | ICD-10-CM | POA: Diagnosis not present

## 2021-11-29 DIAGNOSIS — E785 Hyperlipidemia, unspecified: Secondary | ICD-10-CM | POA: Diagnosis not present

## 2021-11-29 DIAGNOSIS — F419 Anxiety disorder, unspecified: Secondary | ICD-10-CM | POA: Diagnosis not present

## 2021-11-29 DIAGNOSIS — H269 Unspecified cataract: Secondary | ICD-10-CM | POA: Diagnosis not present

## 2021-11-29 DIAGNOSIS — G4733 Obstructive sleep apnea (adult) (pediatric): Secondary | ICD-10-CM | POA: Diagnosis not present

## 2021-11-29 DIAGNOSIS — F325 Major depressive disorder, single episode, in full remission: Secondary | ICD-10-CM | POA: Diagnosis not present

## 2021-12-07 DIAGNOSIS — U071 COVID-19: Secondary | ICD-10-CM | POA: Diagnosis not present

## 2021-12-07 DIAGNOSIS — Z20822 Contact with and (suspected) exposure to covid-19: Secondary | ICD-10-CM | POA: Diagnosis not present

## 2021-12-26 ENCOUNTER — Ambulatory Visit (INDEPENDENT_AMBULATORY_CARE_PROVIDER_SITE_OTHER): Payer: Medicare PPO

## 2021-12-26 ENCOUNTER — Other Ambulatory Visit: Payer: Self-pay

## 2021-12-26 ENCOUNTER — Encounter: Payer: Self-pay | Admitting: Orthopaedic Surgery

## 2021-12-26 ENCOUNTER — Ambulatory Visit: Payer: Medicare PPO | Admitting: Orthopaedic Surgery

## 2021-12-26 DIAGNOSIS — Z96643 Presence of artificial hip joint, bilateral: Secondary | ICD-10-CM

## 2021-12-26 DIAGNOSIS — Z96642 Presence of left artificial hip joint: Secondary | ICD-10-CM | POA: Diagnosis not present

## 2021-12-26 DIAGNOSIS — Z96641 Presence of right artificial hip joint: Secondary | ICD-10-CM

## 2021-12-26 NOTE — Progress Notes (Signed)
The patient is now past 7 months status post a right total hip arthroplasty.  We actually replaced her left hip earlier in 2022.  She is doing well overall.  She does have a little bit of soreness with stair climbing in the right hip.  She is asymptomatic with the left hip.  Overall she is doing well.  She is pleased.  Both hips move smoothly and fluidly with no issues at all from my standpoint.  An AP pelvis and lateral of the more recent right hip shows well-seated implants bilaterally with no complicating features.  We talked in length about the things that we need to bring her back for her hips.  All questions and concerns were answered and addressed.  Follow-up can be as needed.  She is having a little bit of left shoulder pain and if it gets worse bothering her enough she will come see [place].

## 2022-01-08 ENCOUNTER — Other Ambulatory Visit: Payer: Self-pay | Admitting: Family Medicine

## 2022-01-09 ENCOUNTER — Other Ambulatory Visit: Payer: Self-pay

## 2022-01-09 ENCOUNTER — Telehealth: Payer: Self-pay

## 2022-01-09 NOTE — Telephone Encounter (Signed)
Patient called about her C-PAP machine and she needs a prescription regarding her C-PAP and supplies. Pt states she has received her C-PAP machine and supplies but her insurance did not cover it unless she has a prescription.  I Let her know that Dr. Jonni Sanger will be back on Monday and I will send her a message and will contact her.

## 2022-01-11 ENCOUNTER — Telehealth: Payer: Self-pay

## 2022-01-11 ENCOUNTER — Other Ambulatory Visit: Payer: Self-pay

## 2022-01-11 DIAGNOSIS — Z9989 Dependence on other enabling machines and devices: Secondary | ICD-10-CM

## 2022-01-11 DIAGNOSIS — G4733 Obstructive sleep apnea (adult) (pediatric): Secondary | ICD-10-CM

## 2022-01-11 NOTE — Telephone Encounter (Signed)
Order for patients CPAP has been sent in.

## 2022-01-11 NOTE — Telephone Encounter (Signed)
CPAP order has been sent.

## 2022-01-17 ENCOUNTER — Other Ambulatory Visit: Payer: Self-pay

## 2022-01-17 ENCOUNTER — Ambulatory Visit (INDEPENDENT_AMBULATORY_CARE_PROVIDER_SITE_OTHER): Payer: Medicare PPO

## 2022-01-17 DIAGNOSIS — Z1231 Encounter for screening mammogram for malignant neoplasm of breast: Secondary | ICD-10-CM

## 2022-01-17 DIAGNOSIS — Z0000 Encounter for general adult medical examination without abnormal findings: Secondary | ICD-10-CM

## 2022-01-17 NOTE — Progress Notes (Signed)
Virtual Visit via Telephone Note  I connected with  [name] on 01/17/22 at 11:45 AM EST by telephone and verified that I am speaking with the correct person using two identifiers.  Medicare Annual Wellness visit completed telephonically due to Covid-19 pandemic.   Persons participating in this call: This Health Coach and this patient.   Location: Patient: Home Provider: Office    I discussed the limitations, risks, security and privacy concerns of performing an evaluation and management service by telephone and the availability of in person appointments. The patient expressed understanding and agreed to proceed.  Unable to perform video visit due to video visit attempted and failed and/or patient does not have video capability.   Some vital signs may be absent or patient reported.   Willette Brace, LPN   Subjective:   CADYN RODGER is a 71 y.o. female who presents for Medicare Annual (Subsequent) preventive examination.  Review of Systems     Cardiac Risk Factors include: advanced age (>65men, >9 women);dyslipidemia;hypertension;obesity (BMI >30kg/m2)     Objective:    There were no vitals filed for this visit. There is no height or weight on file to calculate BMI.  Advanced Directives 01/17/2022 05/11/2021 05/07/2021 02/09/2021 01/29/2021 11/23/2020 11/14/2020  Does Patient Have a Medical Advance Directive? No No No No No No No  Would patient like information on creating a medical advance directive? Yes (MAU/Ambulatory/Procedural Areas - Information given) No - Patient declined - No - Patient declined No - Patient declined No - Patient declined Yes (MAU/Ambulatory/Procedural Areas - Information given)    Current Medications (verified) Outpatient Encounter Medications as of 01/17/2022  Medication Sig   acetaminophen (TYLENOL) 500 MG tablet Take 1,000 mg by mouth every 6 (six) hours as needed for moderate pain.   cholecalciferol (VITAMIN D3) 25 MCG (1000 UNIT)  tablet Take 1,000 Units by mouth daily.   diltiazem (CARDIZEM CD) 240 MG 24 hr capsule TAKE 1 CAPSULE EVERY DAY (Patient taking differently: 240 mg daily.)   FLUoxetine (PROZAC) 20 MG capsule TAKE 1 CAPSULE EVERY DAY (NEED MD APPOINTMENT)   levothyroxine (SYNTHROID) 150 MCG tablet TAKE 1 TABLET EVERY DAY   losartan-hydrochlorothiazide (HYZAAR) 100-25 MG tablet TAKE 1 TABLET EVERY DAY   rOPINIRole (REQUIP XL) 4 MG 24 hr tablet TAKE 1 TABLET AT BEDTIME   rosuvastatin (CRESTOR) 10 MG tablet TAKE 1 TABLET EVERY DAY   vitamin B-12 (CYANOCOBALAMIN) 1000 MCG tablet Take 1,000 mcg by mouth daily.   vitamin C (ASCORBIC ACID) 500 MG tablet Take 500 mg by mouth 3 (three) times a week.   [DISCONTINUED] methocarbamol (ROBAXIN) 500 MG tablet Take 1 tablet (500 mg total) by mouth every 6 (six) hours as needed for muscle spasms.   No facility-administered encounter medications on file as of 01/17/2022.    Allergies (verified) Patient has no known allergies.   History: Past Medical History:  Diagnosis Date   Anemia    Arthritis    Cancer ([place])    COVID-19 08/28/2020   Depression    Family history of adverse reaction to anesthesia    mother and daughter have severe PONV   Hyperlipidemia    Hypertension    Hypothyroidism    Lumbar spinal stenosis 07/08/2013   OSA on CPAP 10/26/2018   Pre-diabetes    Renal mass 09/2020   Past Surgical History:  Procedure Laterality Date   GASTRIC BYPASS     KNEE ARTHROSCOPY     ROBOTIC ASSITED PARTIAL NEPHRECTOMY Left 09/22/2020  Procedure: XI ROBOTIC ASSITED LAPAROSCOPIC  TOTAL NEPHRECTOMY; RESECTION OF ABDOMINAL MASS;  Surgeon: Ceasar Mons, MD;  Location: [facility];  Service: Urology;  Laterality: Left;   SPINE SURGERY     TONSILLECTOMY     TOTAL HIP ARTHROPLASTY Left 02/09/2021   Procedure: LEFT TOTAL HIP ARTHROPLASTY ANTERIOR APPROACH;  Surgeon: Mcarthur Rossetti, MD;  Location: [facility];  Service: Orthopedics;  Laterality: Left;   TOTAL HIP  ARTHROPLASTY Right 05/11/2021   Procedure: RIGHT TOTAL HIP ARTHROPLASTY ANTERIOR APPROACH;  Surgeon: Mcarthur Rossetti, MD;  Location: [facility];  Service: Orthopedics;  Laterality: Right;   TOTAL KNEE ARTHROPLASTY Right 04/02/2019   Procedure: TOTAL KNEE ARTHROPLASTY;  Surgeon: Sydnee Cabal, MD;  Location: [facility];  Service: Orthopedics;  Laterality: Right;  spinal plus adductor canal   TOTAL KNEE ARTHROPLASTY Left 08/20/2019   Procedure: TOTAL KNEE ARTHROPLASTY;  Surgeon: Sydnee Cabal, MD;  Location: [facility];  Service: Orthopedics;  Laterality: Left;  with adductor canal   TUBAL LIGATION     UVULECTOMY     VENTRAL HERNIA REPAIR N/A 11/23/2020   Procedure: VENTRAL HERNIA REPAIR WITH MESH;  Surgeon: Coralie Keens, MD;  Location: [facility];  Service: General;  Laterality: N/A;  LMA   Family History  Problem Relation Age of Onset   Arthritis Mother    Atrial fibrillation Mother    Hyperlipidemia Mother    Hypertension Mother    Leukemia Father    Heart disease Father    Arthritis Sister    Arthritis Brother    Diabetes Brother    Hyperlipidemia Brother    Allergic rhinitis Neg Hx    Angioedema Neg Hx    Asthma Neg Hx    Eczema Neg Hx    Immunodeficiency Neg Hx    Urticaria Neg Hx    Social History   Socioeconomic History   Marital status: Single    Spouse name: Not on file   Number of children: Not on file   Years of education: Not on file   Highest education level: Not on file  Occupational History   Not on file  Tobacco Use   Smoking status: Never   Smokeless tobacco: Never  Vaping Use   Vaping Use: Never used  Substance and Sexual Activity   Alcohol use: Yes    Comment: rarely   Drug use: No   Sexual activity: Never    Birth control/protection: Post-menopausal  Other Topics Concern   Not on file  Social History Narrative   Not on file   Social Determinants of Health   Financial Resource Strain: Low Risk    Difficulty of Paying Living  Expenses: Not hard at all  Food Insecurity: No Food Insecurity   Worried About Charity fundraiser in the Last Year: Never true   Ran Out of Food in the Last Year: Never true  Transportation Needs: No Transportation Needs   Lack of Transportation (Medical): No   Lack of Transportation (Non-Medical): No  Physical Activity: Inactive   Days of Exercise per Week: 0 days   Minutes of Exercise per Session: 0 min  Stress: No Stress Concern Present   Feeling of Stress : Not at all  Social Connections: Moderately Isolated   Frequency of Communication with Friends and Family: More than three times a week   Frequency of Social Gatherings with Friends and Family: More than three times a week   Attends Religious Services: Never   Active Member of [employer]  or Organizations: Yes   Attends Archivist Meetings: 1 to 4 times per year   Marital Status: Never married    Tobacco Counseling Counseling given: Not Answered   Clinical Intake:  Pre-visit preparation completed: Yes  Pain : No/denies pain     BMI - recorded: 40.27 Nutritional Status: BMI > 30  Obese Nutritional Risks: None Diabetes: No  How often do you need to have someone help you when you read instructions, pamphlets, or other written materials from your doctor or pharmacy?: 1 - Never  Diabetic?no  Interpreter Needed?: No  Information entered by :: Charlott Rakes, LPN   Activities of Daily Living In your present state of health, do you have any difficulty performing the following activities: 01/17/2022 05/11/2021  Hearing? N N  Vision? N N  Difficulty concentrating or making decisions? N N  Walking or climbing stairs? N N  Comment - -  Dressing or bathing? N N  Doing errands, shopping? N N  Preparing Food and eating ? N -  Using the Toilet? N -  In the past six months, have you accidently leaked urine? N -  Do you have problems with loss of bowel control? N -  Managing your Medications? N -  Managing your  Finances? N -  Housekeeping or managing your Housekeeping? N -  Some recent data might be hidden    Patient Care Team: Leamon Arnt, MD as PCP - General (Family Medicine) Sydnee Cabal, MD as Consulting Physician (Orthopedic Surgery)  Indicate any recent Medical Services you may have received from other than [facility] providers in the past year (date may be approximate).     Assessment:   This is a routine wellness examination for Kataya.  Hearing/Vision screen Hearing Screening - Comments:: Pt denies any hearing loss  Vision Screening - Comments:: Pt follows up with Dr Katy Fitch for annual eye exams   Dietary issues and exercise activities discussed: Current Exercise Habits: The patient does not participate in regular exercise at present   Goals Addressed             This Visit's Progress    lose weight and eat healthy         Depression Screen PHQ 2/9 Scores 01/17/2022 06/28/2021 12/14/2020 02/22/2020 04/15/2019 10/26/2018 01/20/2018  PHQ - 2 Score 0 0 0 0 0 0 0  PHQ- 9 Score - 0 0 - - 0 -    Fall Risk Fall Risk  01/17/2022 06/28/2021 02/22/2020 07/21/2018  Falls in the past year? 1 0 0 No  Number falls in past yr: 1 0 0 -  Injury with Fall? 0 0 0 -  Comment fell out of rolling chair - - -  Risk for fall due to : Impaired vision No Fall Risks Orthopedic patient -  Follow up Falls prevention discussed - Falls evaluation completed;Education provided;Falls prevention discussed -    FALL RISK PREVENTION PERTAINING TO THE HOME:  Any stairs in or around the home? No  If so, are there any without handrails? No  Home free of loose throw rugs in walkways, pet beds, electrical cords, etc? Yes  Adequate lighting in your home to reduce risk of falls? Yes   ASSISTIVE DEVICES UTILIZED TO PREVENT FALLS:  Life alert? No  Use of a cane, walker or w/c? No  Grab bars in the bathroom? Yes  Shower chair or bench in shower? Yes  Elevated toilet seat or a handicapped toilet? No   TIMED UP AND  GO:  Was the test performed? No .    Cognitive Function:     6CIT Screen 01/17/2022 02/22/2020  What Year? 0 points 0 points  What month? 0 points 0 points  What time? 0 points 0 points  Count back from 20 0 points 0 points  Months in reverse 0 points 0 points  Repeat phrase 0 points 0 points  Total Score 0 0    Immunizations Immunization History  Administered Date(s) Administered   Fluad Quad(high Dose 65+) 09/23/2020   Influenza Inj Mdck Quad Pf 12/15/2014, 01/15/2016   Influenza, High Dose Seasonal PF 07/21/2018   Influenza, Seasonal, Injecte, Preservative Fre 11/19/2007   Influenza,inj,Quad PF,6+ Mos 10/20/2017   Influenza-Unspecified 10/08/2011   PFIZER(Purple Top)SARS-COV-2 Vaccination 01/27/2020, 02/17/2020, 12/15/2020   PPD Test 11/19/2007   Pneumococcal Conjugate-13 05/17/2020   Tdap 11/19/2007, 07/21/2018   Zoster Recombinat (Shingrix) 12/19/2020   Zoster, Live 07/11/2014    TDAP status: Up to date  Flu Vaccine status: Up to date   Pneumococcal vaccine status: Due, Education has been provided regarding the importance of this vaccine. Advised may receive this vaccine at local pharmacy or Health Dept. Aware to provide a copy of the vaccination record if obtained from local pharmacy or Health Dept. Verbalized acceptance and understanding.  Covid-19 vaccine status: Completed vaccines  Qualifies for Shingles Vaccine? Yes   Zostavax completed Yes   Shingrix Completed?: Yes  Screening Tests Health Maintenance  Topic Date Due   COVID-19 Vaccine (4 - Booster for Pfizer series) 02/09/2021   Zoster Vaccines- Shingrix (2 of 2) 02/13/2021   Pneumonia Vaccine 51+ Years old (2 - PPSV23 if available, else PCV20) 05/17/2021   MAMMOGRAM  05/31/2021   INFLUENZA VACCINE  06/18/2021   DEXA SCAN  11/21/2023   COLONOSCOPY (Pts 45-69yrs Insurance coverage will need to be confirmed)  03/03/2025   TETANUS/TDAP  07/21/2028   Hepatitis C Screening  Completed   HPV VACCINES  Aged  Out    Health Maintenance  Health Maintenance Due  Topic Date Due   COVID-19 Vaccine (4 - Booster for Pfizer series) 02/09/2021   Zoster Vaccines- Shingrix (2 of 2) 02/13/2021   Pneumonia Vaccine 57+ Years old (2 - PPSV23 if available, else PCV20) 05/17/2021   MAMMOGRAM  05/31/2021   INFLUENZA VACCINE  06/18/2021    Colorectal cancer screening: Type of screening: Colonoscopy. Completed 03/04/15. Repeat every 10 years  Mammogram status: Ordered 01/17/22. Pt provided with contact info and advised to call to schedule appt.   Bone Density status: Completed 11/20/18. Results reflect: Bone density results: NORMAL. Repeat every 5 years.   Additional Screening:  Hepatitis C Screening: Completed 06/23/16  Vision Screening: Recommended annual ophthalmology exams for early detection of glaucoma and other disorders of the eye. Is the patient up to date with their annual eye exam?  Yes  Who is the provider or what is the name of the office in which the patient attends annual eye exams? Dr Katy Fitch If pt is not established with a provider, would they like to be referred to a provider to establish care? No .   Dental Screening: Recommended annual dental exams for proper oral hygiene  Community Resource Referral / Chronic Care Management: CRR required this visit?  No   CCM required this visit?  No      Plan:     I have personally reviewed and noted the following in the patients chart:   Medical and social history Use of alcohol, tobacco or illicit  drugs  Current medications and supplements including opioid prescriptions.  Functional ability and status Nutritional status Physical activity Advanced directives List of other physicians Hospitalizations, surgeries, and ER visits in previous 12 months Vitals Screenings to include cognitive, depression, and falls Referrals and appointments  In addition, I have reviewed and discussed with patient certain preventive protocols, quality metrics,  and best practice recommendations. A written personalized care plan for preventive services as well as general preventive health recommendations were provided to patient.     Willette Brace, LPN   07/26/2819   Nurse Notes: Pt is requesting that a script be sent to [facility] for cpap fax number [phone]. Please advise

## 2022-01-17 NOTE — Patient Instructions (Signed)
Ms. Veronica Finley , Thank you for taking time to come for your Medicare Wellness Visit. I appreciate your ongoing commitment to your health goals. Please review the following plan we discussed and let me know if I can assist you in the future.   Screening recommendations/referrals: Colonoscopy: Done 03/04/15 repeat every 10 years  Mammogram: order placed 01/17/22 Bone Density: Done 11/20/18 repeat every 2 years  Recommended yearly ophthalmology/optometry visit for glaucoma screening and checkup Recommended yearly dental visit for hygiene and checkup  Vaccinations: Influenza vaccine: pt stated completed  Pneumococcal vaccine: due and discussed  Tdap vaccine: Done 07/21/18 repeat every 10 years  Shingles vaccine: 1st dose 12/19/20    Covid-19:Completed 3/11, 02/17/20 & 12/15/20  Advanced directives: Advance directive discussed with you today. I have provided a copy for you to complete at home and have notarized. Once this is complete please bring a copy in to our office so we can scan it into your chart.  Conditions/risks identified: lose weight and eat healthy   Next appointment: Follow up in one year for your annual wellness visit    Preventive Care 65 Years and Older, Female Preventive care refers to lifestyle choices and visits with your health care provider that can promote health and wellness. What does preventive care include? A yearly physical exam. This is also called an annual well check. Dental exams once or twice a year. Routine eye exams. Ask your health care provider how often you should have your eyes checked. Personal lifestyle choices, including: Daily care of your teeth and gums. Regular physical activity. Eating a healthy diet. Avoiding tobacco and drug use. Limiting alcohol use. Practicing safe sex. Taking low-dose aspirin every day. Taking vitamin and mineral supplements as recommended by your health care provider. What happens during an annual well check? The services and  screenings done by your health care provider during your annual well check will depend on your age, overall health, lifestyle risk factors, and family history of disease. Counseling  Your health care provider may ask you questions about your: Alcohol use. Tobacco use. Drug use. Emotional well-being. Home and relationship well-being. Sexual activity. Eating habits. History of falls. Memory and ability to understand (cognition). Work and work Statistician. Reproductive health. Screening  You may have the following tests or measurements: Height, weight, and BMI. Blood pressure. Lipid and cholesterol levels. These may be checked every 5 years, or more frequently if you are over 66 years old. Skin check. Lung cancer screening. You may have this screening every year starting at age 81 if you have a 30-pack-year history of smoking and currently smoke or have quit within the past 15 years. Fecal occult blood test (FOBT) of the stool. You may have this test every year starting at age 39. Flexible sigmoidoscopy or colonoscopy. You may have a sigmoidoscopy every 5 years or a colonoscopy every 10 years starting at age 48. Hepatitis C blood test. Hepatitis B blood test. Sexually transmitted disease (STD) testing. Diabetes screening. This is done by checking your blood sugar (glucose) after you have not eaten for a while (fasting). You may have this done every 1-3 years. Bone density scan. This is done to screen for osteoporosis. You may have this done starting at age 48. Mammogram. This may be done every 1-2 years. Talk to your health care provider about how often you should have regular mammograms. Talk with your health care provider about your test results, treatment options, and if necessary, the need for more tests. Vaccines  Your  health care provider may recommend certain vaccines, such as: Influenza vaccine. This is recommended every year. Tetanus, diphtheria, and acellular pertussis (Tdap,  Td) vaccine. You may need a Td booster every 10 years. Zoster vaccine. You may need this after age 64. Pneumococcal 13-valent conjugate (PCV13) vaccine. One dose is recommended after age 63. Pneumococcal polysaccharide (PPSV23) vaccine. One dose is recommended after age 4. Talk to your health care provider about which screenings and vaccines you need and how often you need them. This information is not intended to replace advice given to you by your health care provider. Make sure you discuss any questions you have with your health care provider. Document Released: 12/01/2015 Document Revised: 07/24/2016 Document Reviewed: 09/05/2015 Elsevier Interactive Patient Education  2017 [place] Prevention in the Home Falls can cause injuries. They can happen to people of all ages. There are many things you can do to make your home safe and to help prevent falls. What can I do on the outside of my home? Regularly fix the edges of walkways and driveways and fix any cracks. Remove anything that might make you trip as you walk through a door, such as a raised step or threshold. Trim any bushes or trees on the path to your home. Use bright outdoor lighting. Clear any walking paths of anything that might make someone trip, such as rocks or tools. Regularly check to see if handrails are loose or broken. Make sure that both sides of any steps have handrails. Any raised decks and porches should have guardrails on the edges. Have any leaves, snow, or ice cleared regularly. Use sand or salt on walking paths during winter. Clean up any spills in your garage right away. This includes oil or grease spills. What can I do in the bathroom? Use night lights. Install grab bars by the toilet and in the tub and shower. Do not use towel bars as grab bars. Use non-skid mats or decals in the tub or shower. If you need to sit down in the shower, use a plastic, non-slip stool. Keep the floor dry. Clean up any  water that spills on the floor as soon as it happens. Remove soap buildup in the tub or shower regularly. Attach bath mats securely with double-sided non-slip rug tape. Do not have throw rugs and other things on the floor that can make you trip. What can I do in the bedroom? Use night lights. Make sure that you have a light by your bed that is easy to reach. Do not use any sheets or blankets that are too big for your bed. They should not hang down onto the floor. Have a firm chair that has side arms. You can use this for support while you get dressed. Do not have throw rugs and other things on the floor that can make you trip. What can I do in the kitchen? Clean up any spills right away. Avoid walking on wet floors. Keep items that you use a lot in easy-to-reach places. If you need to reach something above you, use a strong step stool that has a grab bar. Keep electrical cords out of the way. Do not use floor polish or wax that makes floors slippery. If you must use wax, use non-skid floor wax. Do not have throw rugs and other things on the floor that can make you trip. What can I do with my stairs? Do not leave any items on the stairs. Make sure that there are handrails  on both sides of the stairs and use them. Fix handrails that are broken or loose. Make sure that handrails are as long as the stairways. Check any carpeting to make sure that it is firmly attached to the stairs. Fix any carpet that is loose or worn. Avoid having throw rugs at the top or bottom of the stairs. If you do have throw rugs, attach them to the floor with carpet tape. Make sure that you have a light switch at the top of the stairs and the bottom of the stairs. If you do not have them, ask someone to add them for you. What else can I do to help prevent falls? Wear shoes that: Do not have high heels. Have rubber bottoms. Are comfortable and fit you well. Are closed at the toe. Do not wear sandals. If you use a  stepladder: Make sure that it is fully opened. Do not climb a closed stepladder. Make sure that both sides of the stepladder are locked into place. Ask someone to hold it for you, if possible. Clearly mark and make sure that you can see: Any grab bars or handrails. First and last steps. Where the edge of each step is. Use tools that help you move around (mobility aids) if they are needed. These include: Canes. Walkers. Scooters. Crutches. Turn on the lights when you go into a dark area. Replace any light bulbs as soon as they burn out. Set up your furniture so you have a clear path. Avoid moving your furniture around. If any of your floors are uneven, fix them. If there are any pets around you, be aware of where they are. Review your medicines with your doctor. Some medicines can make you feel dizzy. This can increase your chance of falling. Ask your doctor what other things that you can do to help prevent falls. This information is not intended to replace advice given to you by your health care provider. Make sure you discuss any questions you have with your health care provider. Document Released: 08/31/2009 Document Revised: 04/11/2016 Document Reviewed: 12/09/2014 Elsevier Interactive Patient Education  2017 [employer].

## 2022-01-21 DIAGNOSIS — C642 Malignant neoplasm of left kidney, except renal pelvis: Secondary | ICD-10-CM | POA: Diagnosis not present

## 2022-01-23 DIAGNOSIS — K439 Ventral hernia without obstruction or gangrene: Secondary | ICD-10-CM | POA: Diagnosis not present

## 2022-01-23 DIAGNOSIS — I700 Atherosclerosis of aorta: Secondary | ICD-10-CM | POA: Diagnosis not present

## 2022-01-23 DIAGNOSIS — C642 Malignant neoplasm of left kidney, except renal pelvis: Secondary | ICD-10-CM | POA: Diagnosis not present

## 2022-01-23 DIAGNOSIS — Z85528 Personal history of other malignant neoplasm of kidney: Secondary | ICD-10-CM | POA: Diagnosis not present

## 2022-01-23 DIAGNOSIS — K8020 Calculus of gallbladder without cholecystitis without obstruction: Secondary | ICD-10-CM | POA: Diagnosis not present

## 2022-02-07 DIAGNOSIS — K862 Cyst of pancreas: Secondary | ICD-10-CM | POA: Diagnosis not present

## 2022-02-07 DIAGNOSIS — C642 Malignant neoplasm of left kidney, except renal pelvis: Secondary | ICD-10-CM | POA: Diagnosis not present

## 2022-02-20 DIAGNOSIS — H1045 Other chronic allergic conjunctivitis: Secondary | ICD-10-CM | POA: Diagnosis not present

## 2022-02-20 DIAGNOSIS — H40012 Open angle with borderline findings, low risk, left eye: Secondary | ICD-10-CM | POA: Diagnosis not present

## 2022-02-20 DIAGNOSIS — H04123 Dry eye syndrome of bilateral lacrimal glands: Secondary | ICD-10-CM | POA: Diagnosis not present

## 2022-02-20 DIAGNOSIS — H25813 Combined forms of age-related cataract, bilateral: Secondary | ICD-10-CM | POA: Diagnosis not present

## 2022-02-25 ENCOUNTER — Other Ambulatory Visit (HOSPITAL_BASED_OUTPATIENT_CLINIC_OR_DEPARTMENT_OTHER): Payer: Self-pay | Admitting: Urology

## 2022-02-25 ENCOUNTER — Ambulatory Visit (HOSPITAL_BASED_OUTPATIENT_CLINIC_OR_DEPARTMENT_OTHER)
Admission: RE | Admit: 2022-02-25 | Discharge: 2022-02-25 | Disposition: A | Discharge status: Home / Self Care | Payer: Medicare PPO | Source: Ambulatory Visit | Attending: Urology | Admitting: Urology

## 2022-02-25 DIAGNOSIS — C642 Malignant neoplasm of left kidney, except renal pelvis: Secondary | ICD-10-CM

## 2022-02-25 DIAGNOSIS — Z905 Acquired absence of kidney: Secondary | ICD-10-CM | POA: Diagnosis not present

## 2022-03-06 ENCOUNTER — Ambulatory Visit
Admission: RE | Admit: 2022-03-06 | Discharge: 2022-03-06 | Disposition: A | Discharge status: Home / Self Care | Payer: Medicare PPO | Source: Ambulatory Visit | Attending: Family Medicine | Admitting: Family Medicine

## 2022-03-06 DIAGNOSIS — Z1231 Encounter for screening mammogram for malignant neoplasm of breast: Secondary | ICD-10-CM | POA: Diagnosis not present

## 2022-03-07 ENCOUNTER — Ambulatory Visit: Payer: Self-pay

## 2022-03-07 ENCOUNTER — Ambulatory Visit (INDEPENDENT_AMBULATORY_CARE_PROVIDER_SITE_OTHER): Payer: Medicare PPO

## 2022-03-07 ENCOUNTER — Ambulatory Visit: Payer: Medicare PPO | Admitting: Physician Assistant

## 2022-03-07 ENCOUNTER — Encounter: Payer: Self-pay | Admitting: Physician Assistant

## 2022-03-07 DIAGNOSIS — M79641 Pain in right hand: Secondary | ICD-10-CM | POA: Diagnosis not present

## 2022-03-07 DIAGNOSIS — M25531 Pain in right wrist: Secondary | ICD-10-CM

## 2022-03-07 NOTE — Progress Notes (Signed)
? ?Office Visit Note ?  ?Patient: Veronica Finley           ?Date of Birth: 02-Sep-1951           ?MRN: [id number] ?Visit Date: 03/07/2022 ?             ?Requested by: Leamon Arnt, MD ?[address] ?[place],  [place] [id number] ?PCP: Leamon Arnt, MD ? ? ?Assessment & Plan: ?Visit Diagnoses:  ?1. Pain in right wrist   ?2. Pain in right hand   ? ? ?Plan: Patient unable to take NSAIDs therefore recommended Tylenol elevation ice.  Questions encouraged by Dr. Ninfa Linden and myself.  Pain continues or becomes worse she will follow-up with [place] otherwise as needed. ? ?Follow-Up Instructions: Return if symptoms worsen or fail to improve.  ? ?Orders:  ?Orders Placed This Encounter  ?Procedures  ? XR Hand Complete Right  ? XR Wrist Complete Right  ? ?No orders of the defined types were placed in this encounter. ? ? ? ? Procedures: ?No procedures performed ? ? ?Clinical Data: ?No additional findings. ? ? ?Subjective: ?Chief Complaint  ?Patient presents with  ? Right Wrist - Pain  ? Right Hand - Pain  ? ? ?HPI ? ?Review of Systems  ?Constitutional:  Negative for chills and fever.  ? ? ?Objective: ?Vital Signs: There were no vitals taken for this visit. ? ?Physical Exam ?Constitutional:   ?   Appearance: She is not ill-appearing or diaphoretic.  ?Pulmonary:  ?   Effort: Pulmonary effort is normal.  ?Neurological:  ?   Mental Status: She is alert.  ?Psychiatric:     ?   Mood and Affect: Mood normal.  ? ? ?Ortho Exam ?Right hand slight edema no significant erythema.  No ecchymosis.  Tenderness over the volar aspect of the wrist over the area of the flexor carpi ulnaris tendon.  Full sensation to light touch throughout the hand.  Full range of motion of the fingers.  She has some discomfort with making a fist but is able to do this.  She has full ulnar and radial deviation of the wrist without significant pain.  Full dorsal and volar flexion of the wrist without pain. ? ?Specialty Comments:  ?No specialty comments  available. ? ?Imaging: ?XR Wrist Complete Right ? ?Result Date: 03/07/2022 ?Right wrist 3 views: No acute fracture.  No bony abnormalities.  Wrist well located. ? ?XR Hand Complete Right ? ?Result Date: 03/07/2022 ?Right hand 3 views: No acute fractures.  No subluxations dislocations throughout the hand.  No bony abnormalities.  ? ? ?PMFS History: ?Patient Active Problem List  ? Diagnosis Date Noted  ? Stage 3a chronic kidney disease ([place]) 06/28/2021  ? Status post total replacement of right hip 05/11/2021  ? Status post left hip replacement 02/10/2021  ? Status post total replacement of left hip 02/09/2021  ? History of renal cell carcinoma 12/14/2020  ? Unilateral primary osteoarthritis, left hip 12/11/2020  ? Unilateral primary osteoarthritis, right hip 12/11/2020  ? Status post nephrectomy 10/24/2020  ? Vitamin B12 deficiency 06/02/2020  ? S/P knee replacement 04/02/2019  ? Contact dermatitis due to chemicals 01/25/2019  ? OSA on CPAP 10/26/2018  ? Generalized hyperhidrosis 07/15/2016  ? Hx of laparoscopic gastric banding+ truncal vagotomy 03/24/2006 01/10/2014  ? Spinal stenosis of lumbar region 07/08/2013  ? Major depression, chronic 07/08/2013  ? Essential hypertension 04/06/2012  ? Acquired hypothyroidism 10/08/2011  ? Mixed hyperlipidemia 10/08/2011  ? AR (allergic rhinitis)  12/20/2010  ? Restless legs 12/20/2010  ? Obesity (BMI 30-39.9) 12/17/2010  ? Osteoarthritis of multiple joints 12/17/2010  ? ?Past Medical History:  ?Diagnosis Date  ? Anemia   ? Arthritis   ? Cancer [facility])   ? COVID-19 08/28/2020  ? Depression   ? Family history of adverse reaction to anesthesia   ? mother and daughter have severe PONV  ? Hyperlipidemia   ? Hypertension   ? Hypothyroidism   ? Lumbar spinal stenosis 07/08/2013  ? OSA on CPAP 10/26/2018  ? Pre-diabetes   ? Renal mass 09/2020  ?  ?Family History  ?Problem Relation Age of Onset  ? Arthritis Mother   ? Atrial fibrillation Mother   ? Hyperlipidemia Mother   ? Hypertension Mother    ? Leukemia Father   ? Heart disease Father   ? Arthritis Sister   ? Arthritis Brother   ? Diabetes Brother   ? Hyperlipidemia Brother   ? Allergic rhinitis Neg Hx   ? Angioedema Neg Hx   ? Asthma Neg Hx   ? Eczema Neg Hx   ? Immunodeficiency Neg Hx   ? Urticaria Neg Hx   ?  ?Past Surgical History:  ?Procedure Laterality Date  ? GASTRIC BYPASS    ? KNEE ARTHROSCOPY    ? ROBOTIC ASSITED PARTIAL NEPHRECTOMY Left 09/22/2020  ? Procedure: XI ROBOTIC ASSITED LAPAROSCOPIC  TOTAL NEPHRECTOMY; RESECTION OF ABDOMINAL MASS;  Surgeon: Ceasar Mons, MD;  Location: [facility];  Service: Urology;  Laterality: Left;  ? SPINE SURGERY    ? TONSILLECTOMY    ? TOTAL HIP ARTHROPLASTY Left 02/09/2021  ? Procedure: LEFT TOTAL HIP ARTHROPLASTY ANTERIOR APPROACH;  Surgeon: Mcarthur Rossetti, MD;  Location: [facility];  Service: Orthopedics;  Laterality: Left;  ? TOTAL HIP ARTHROPLASTY Right 05/11/2021  ? Procedure: RIGHT TOTAL HIP ARTHROPLASTY ANTERIOR APPROACH;  Surgeon: Mcarthur Rossetti, MD;  Location: [facility];  Service: Orthopedics;  Laterality: Right;  ? TOTAL KNEE ARTHROPLASTY Right 04/02/2019  ? Procedure: TOTAL KNEE ARTHROPLASTY;  Surgeon: Sydnee Cabal, MD;  Location: [facility];  Service: Orthopedics;  Laterality: Right;  spinal plus adductor canal  ? TOTAL KNEE ARTHROPLASTY Left 08/20/2019  ? Procedure: TOTAL KNEE ARTHROPLASTY;  Surgeon: Sydnee Cabal, MD;  Location: [facility];  Service: Orthopedics;  Laterality: Left;  with adductor canal  ? TUBAL LIGATION    ? UVULECTOMY    ? VENTRAL HERNIA REPAIR N/A 11/23/2020  ? Procedure: VENTRAL HERNIA REPAIR WITH MESH;  Surgeon: Coralie Keens, MD;  Location: [facility];  Service: General;  Laterality: N/A;  LMA  ? ?Social History  ? ?Occupational History  ? Not on file  ?Tobacco Use  ? Smoking status: Never  ? Smokeless tobacco: Never  ?Vaping Use  ? Vaping Use: Never used  ?Substance and Sexual Activity  ? Alcohol use: Yes  ?  Comment: rarely  ? Drug use: No  ?  Sexual activity: Never  ?  Birth control/protection: Post-menopausal  ? ? ? ? ? ? ?

## 2022-03-22 ENCOUNTER — Other Ambulatory Visit: Payer: Self-pay | Admitting: Family Medicine

## 2022-04-09 DIAGNOSIS — H2512 Age-related nuclear cataract, left eye: Secondary | ICD-10-CM | POA: Diagnosis not present

## 2022-04-10 ENCOUNTER — Telehealth: Payer: Self-pay | Admitting: *Deleted

## 2022-04-10 NOTE — Telephone Encounter (Signed)
Ortho bundle 1 year call for Left total hip (02/09/21) and Right total hip (05/11/21).

## 2022-04-26 DIAGNOSIS — H2511 Age-related nuclear cataract, right eye: Secondary | ICD-10-CM | POA: Diagnosis not present

## 2022-04-30 DIAGNOSIS — H2511 Age-related nuclear cataract, right eye: Secondary | ICD-10-CM | POA: Diagnosis not present

## 2022-06-03 ENCOUNTER — Other Ambulatory Visit: Payer: Self-pay | Admitting: Family Medicine

## 2022-06-03 IMAGING — MR MR LUMBAR SPINE W/O CM
5 series · 43 of 48 positions shown · non-contrast
Comparison: Radiography 06/07/2020

CLINICAL DATA: Spinal stenosis. Low back pain. Lumbar
radiculopathy. Left leg weakness.

EXAM:
MRI LUMBAR SPINE WITHOUT CONTRAST
TECHNIQUE: Multiplanar, multisequence MR imaging of the lumbar spine was
performed. No intravenous contrast was administered.

[Series 3: T2 · sagittal · 4.0mm · 0.88mm/px · 6 of 14 slices shown (1 of 2)]
[im 1/14]
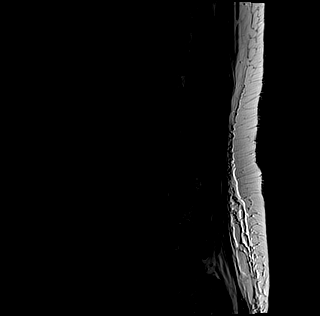
[im 3/14]
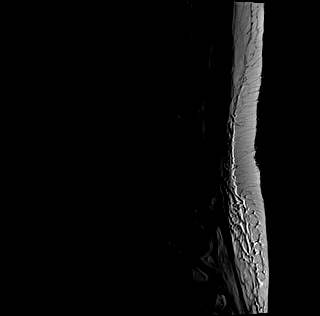
[im 6/14]
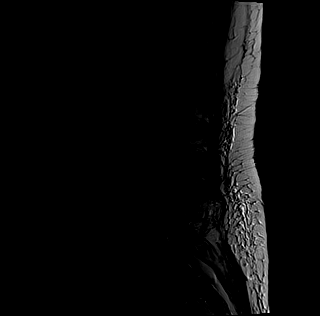
[im 8/14]
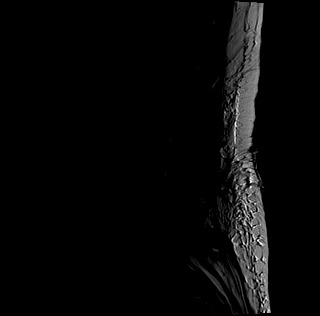
[im 11/14]
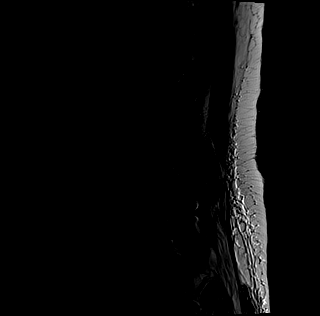
[im 14/14]
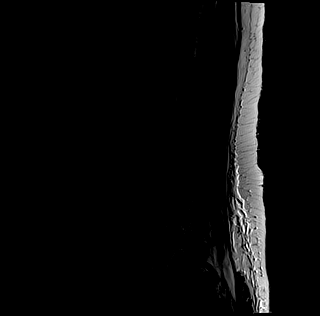

[Series 4: tirm sag · sagittal · 4.0mm · 0.55mm/px · 6 of 14 slices shown]
[im 1/14]
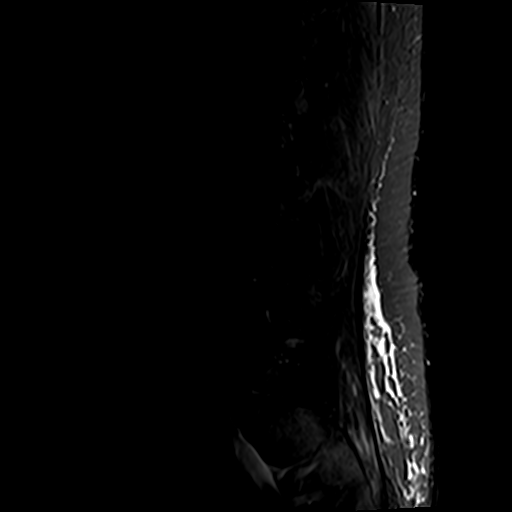
[im 3/14]
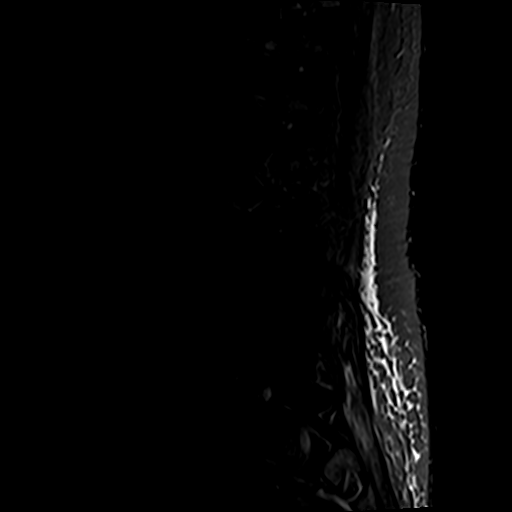
[im 6/14]
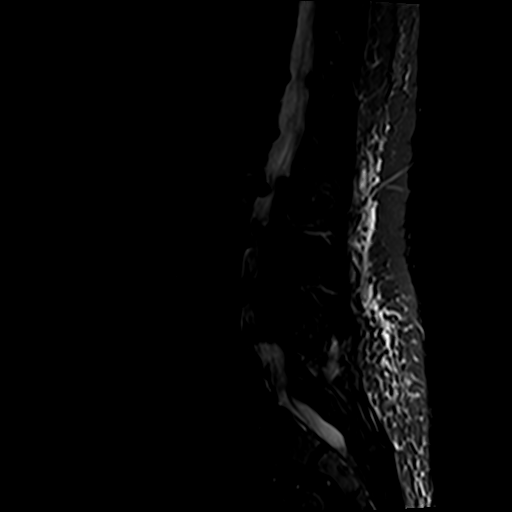
[im 8/14]
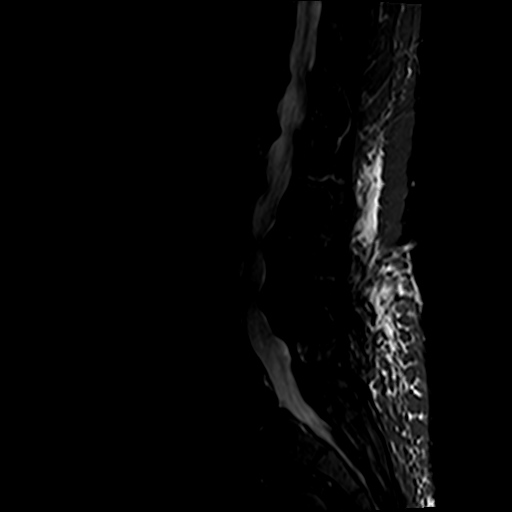
[im 11/14]
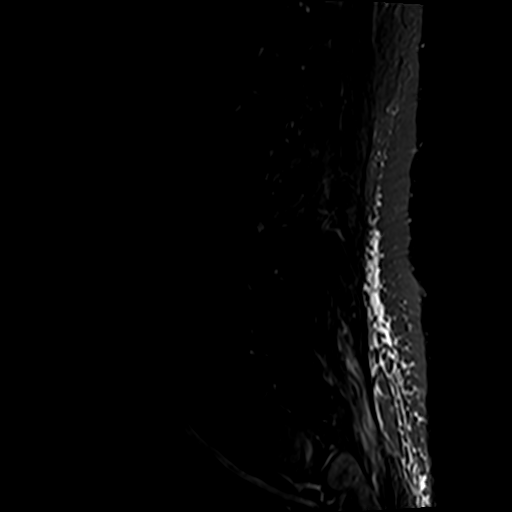
[im 14/14]
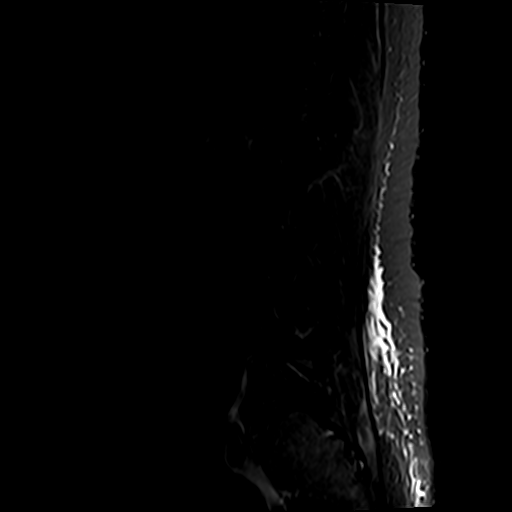

[Series 5: T1 · sagittal · 4.0mm · 0.88mm/px · 6 of 14 slices shown (1 of 2)]
[im 1/14]
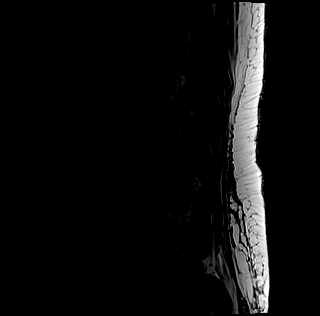
[im 3/14]
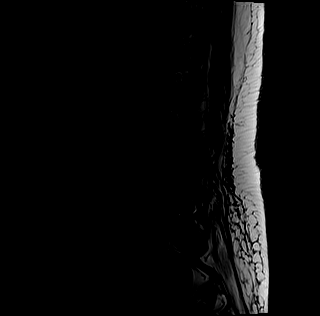
[im 6/14]
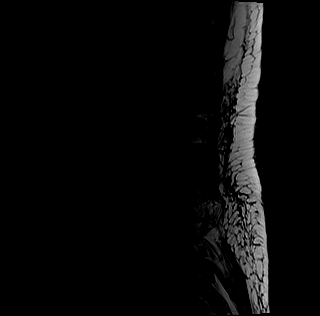
[im 8/14]
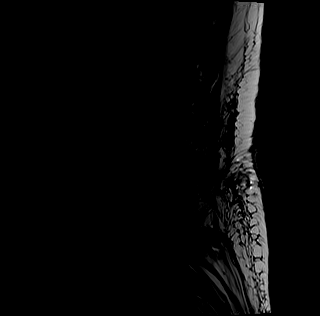
[im 11/14]
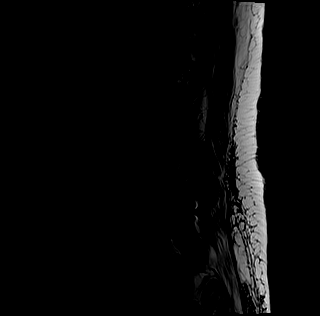
[im 14/14]
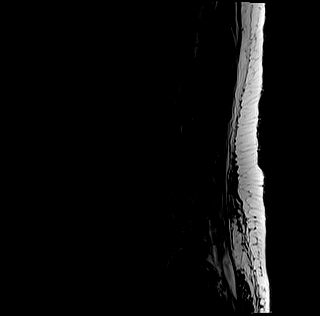

[Series 6: T1 · axial · 4.0mm · 0.70mm/px · z∈[-123,+84]mm · 10 of 34 slices shown (2 of 2)]
[im 1/34]
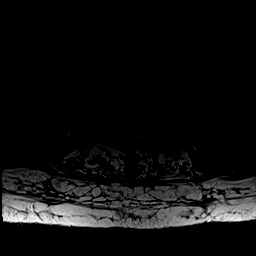
[im 3/34]
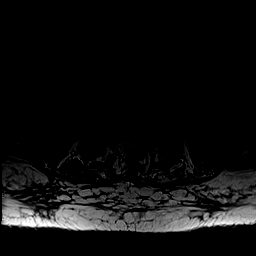
[im 5/34]
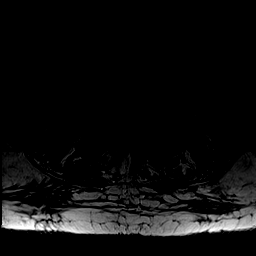
[im 10/34]
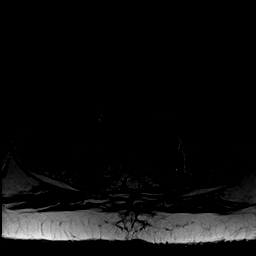
[im 15/34]
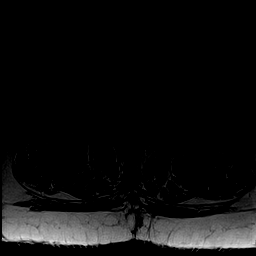
[im 17/34]
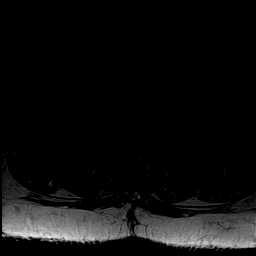
[im 19/34]
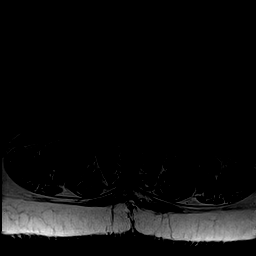
[im 24/34]
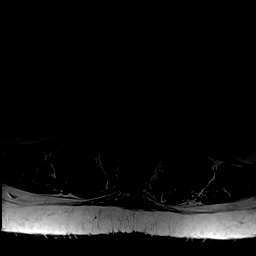
[im 29/34]
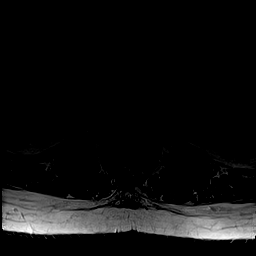
[im 34/34]
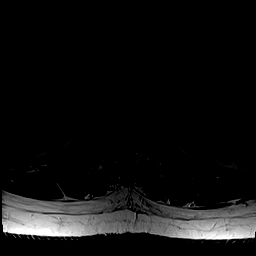

[Series 7: T2 · axial · 4.0mm · 0.70mm/px · z∈[-123,+84]mm · 15 of 34 slices shown (2 of 2)]
[im 1/34]
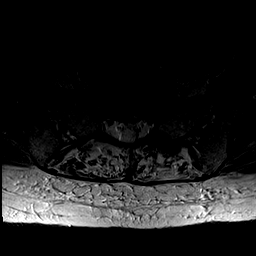
[im 3/34]
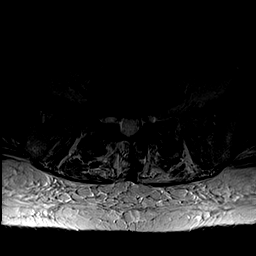
[im 5/34]
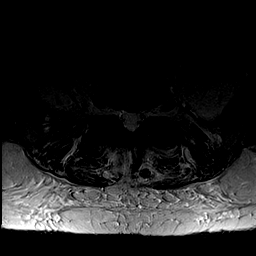
[im 8/34]
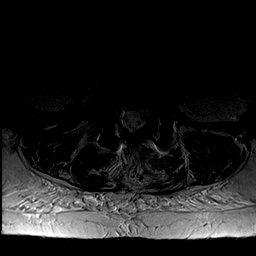
[im 10/34]
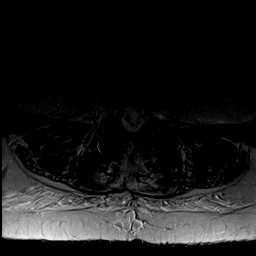
[im 12/34]
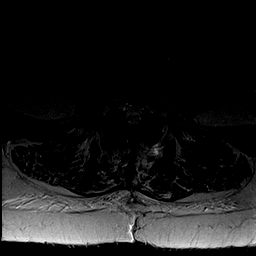
[im 15/34]
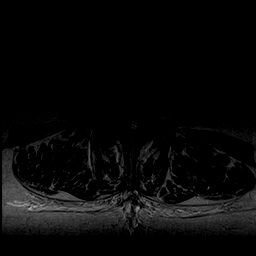
[im 17/34]
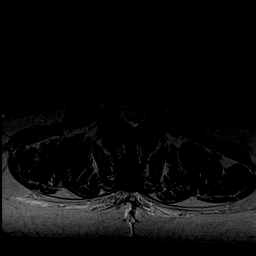
[im 19/34]
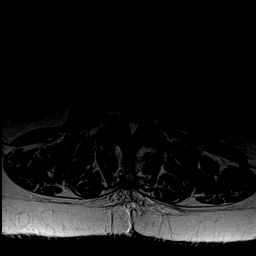
[im 22/34]
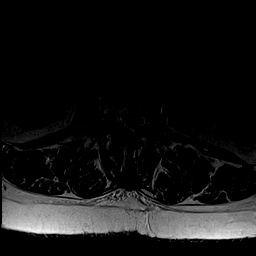
[im 24/34]
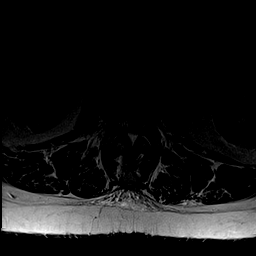
[im 26/34]
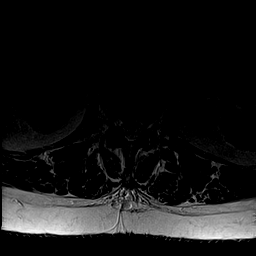
[im 29/34]
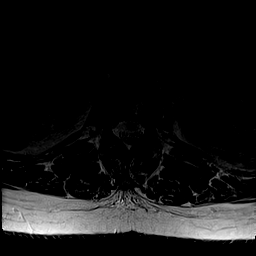
[im 31/34]
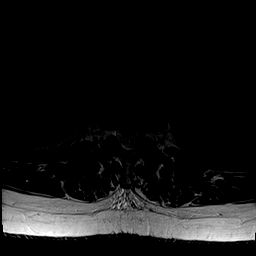
[im 34/34]
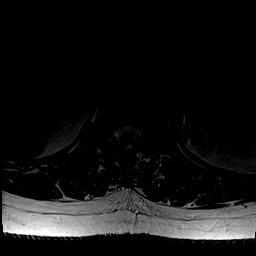

[43 of 48 positions shown; findings below may reference images not displayed]

FINDINGS: Segmentation:  5 lumbar type vertebral bodies assumed.

Alignment:  Mild curvature convex to the left.

Vertebrae: Distant PLIF L4-5 with solid union. Discogenic endplate
marrow changes including mild edema at the L1-2 level.

Conus medullaris and cauda equina: Conus extends to the T12-L1
level. Conus and cauda equina appear normal.

Paraspinal and other soft tissues: Question 3.7 cm mass the left
kidney. This is not well or primarily evaluated. Renal ultrasound or
CT abdomen recommended.

Disc levels:

Mild non-compressive disc bulges at T11-12 and T12-L1.

L1-2: Endplate osteophytes and bulging of the disc. Mild stenosis of
both lateral recesses but without definite neural compression

L2-3: Endplate osteophytes and bulging of the disc. Facet and
ligamentous hypertrophy. Moderate multifactorial stenosis could
possibly cause neural compression.

L3-4: Endplate osteophytes and bulging of the disc. Facet and
ligamentous hypertrophy. Moderate to severe stenosis could cause
neural compression.

L4-5: Good appearance following previous PLIF.

L5-S1: No disc abnormality. Mild facet degeneration and hypertrophy.
No compressive stenosis.
IMPRESSION: Good appearance at L4-5 following previous posterior decompression
and fusion.

Moderate to severe multifactorial stenosis at the L3-4 level that
could cause neural compression on either or both sides.

Moderate multifactorial stenosis at the L2-3 level that could also
be symptomatic.

Mild bilateral lateral recess narrowing at L1-2 without likely
compressive stenosis.

Facet osteoarthritis at L5-S1 without stenosis.

Question 3.7 cm mass in the medial left kidney. This is not well
seen. Renal ultrasound or CT abdomen is suggested.

## 2022-07-01 ENCOUNTER — Other Ambulatory Visit: Payer: Self-pay | Admitting: Family Medicine

## 2022-07-17 ENCOUNTER — Ambulatory Visit: Payer: Medicare PPO | Admitting: Family Medicine

## 2022-07-17 ENCOUNTER — Encounter: Payer: Self-pay | Admitting: Family Medicine

## 2022-07-17 VITALS — BP 116/60 | HR 79 | Temp 98.0°F | Ht 65.0 in | Wt 242.4 lb

## 2022-07-17 DIAGNOSIS — G2581 Restless legs syndrome: Secondary | ICD-10-CM | POA: Diagnosis not present

## 2022-07-17 DIAGNOSIS — E538 Deficiency of other specified B group vitamins: Secondary | ICD-10-CM | POA: Diagnosis not present

## 2022-07-17 DIAGNOSIS — Z9884 Bariatric surgery status: Secondary | ICD-10-CM

## 2022-07-17 DIAGNOSIS — Z23 Encounter for immunization: Secondary | ICD-10-CM

## 2022-07-17 DIAGNOSIS — E039 Hypothyroidism, unspecified: Secondary | ICD-10-CM

## 2022-07-17 DIAGNOSIS — Z9989 Dependence on other enabling machines and devices: Secondary | ICD-10-CM

## 2022-07-17 DIAGNOSIS — G4733 Obstructive sleep apnea (adult) (pediatric): Secondary | ICD-10-CM | POA: Diagnosis not present

## 2022-07-17 DIAGNOSIS — N1831 Chronic kidney disease, stage 3a: Secondary | ICD-10-CM

## 2022-07-17 DIAGNOSIS — F339 Major depressive disorder, recurrent, unspecified: Secondary | ICD-10-CM

## 2022-07-17 DIAGNOSIS — I10 Essential (primary) hypertension: Secondary | ICD-10-CM

## 2022-07-17 DIAGNOSIS — Z6841 Body Mass Index (BMI) 40.0 and over, adult: Secondary | ICD-10-CM | POA: Diagnosis not present

## 2022-07-17 DIAGNOSIS — E782 Mixed hyperlipidemia: Secondary | ICD-10-CM

## 2022-07-17 MED ORDER — LOSARTAN POTASSIUM-HCTZ 100-25 MG PO TABS: 1.0000 | ORAL_TABLET | Freq: Every day | ORAL | 3 refills | Status: DC

## 2022-07-17 MED ORDER — ROSUVASTATIN CALCIUM 10 MG PO TABS: 10.0000 mg | ORAL_TABLET | Freq: Every day | ORAL | 3 refills | Status: DC

## 2022-07-17 MED ORDER — ROPINIROLE HCL ER 4 MG PO TB24: 4.0000 mg | ORAL_TABLET | Freq: Every day | ORAL | 3 refills | Status: DC

## 2022-07-17 MED ORDER — DILTIAZEM HCL ER COATED BEADS 240 MG PO CP24: 240.0000 mg | ORAL_CAPSULE | Freq: Every day | ORAL | 3 refills | Status: DC

## 2022-07-17 NOTE — Patient Instructions (Signed)

## 2022-07-17 NOTE — Progress Notes (Unsigned)
Subjective  CC: No chief complaint on file.   HPI: Veronica Finley is a 71 y.o. female who presents to the office today to address the problems listed above in the chief complaint. Hypertension f/u: Control is {Desc; good/fair/poor:18582}. Pt reports she is doing well. {Hypertension and cvs ros:5727}. ***She denies adverse effects from his BP medications. Compliance with medication is good.   Assessment  1. Acquired hypothyroidism   2. Essential hypertension   3. Major depression, chronic   4. Mixed hyperlipidemia   5. OSA on CPAP   6. Stage 3a chronic kidney disease ([place])      Plan   Hypertension f/u: BP control is {DESC; WELL/FAIRLY WELL/POORLY:18703} controlled. ***  Education regarding management of these chronic disease states was given. Management strategies discussed on successive visits include dietary and exercise recommendations, goals of achieving and maintaining IBW, and lifestyle modifications aiming for adequate sleep and minimizing stressors.   Follow up: No follow-ups on file.  No orders of the defined types were placed in this encounter.  No orders of the defined types were placed in this encounter.     BP Readings from Last 3 Encounters:  07/17/22 116/60  06/28/21 123/83  05/13/21 (!) 116/55   Wt Readings from Last 3 Encounters:  07/17/22 242 lb 6.4 oz (110 kg)  06/28/21 242 lb (109.8 kg)  05/11/21 239 lb (108.4 kg)    Lab Results  Component Value Date   CHOL 142 12/14/2020   CHOL 122 05/17/2020   CHOL 118 10/26/2018   Lab Results  Component Value Date   HDL 45.20 12/14/2020   HDL 47.40 05/17/2020   HDL 49.90 10/26/2018   Lab Results  Component Value Date   LDLCALC 63 12/14/2020   LDLCALC 57 05/17/2020   LDLCALC 50 10/26/2018   Lab Results  Component Value Date   TRIG 171.0 (H) 12/14/2020   TRIG 91.0 05/17/2020   TRIG 90.0 10/26/2018   Lab Results  Component Value Date   CHOLHDL 3 12/14/2020   CHOLHDL 3 05/17/2020   CHOLHDL  2 10/26/2018   No results found for: "LDLDIRECT" Lab Results  Component Value Date   CREATININE 1.28 (H) 05/12/2021   BUN 17 05/12/2021   NA 140 05/12/2021   K 4.7 05/12/2021   CL 107 05/12/2021   CO2 28 05/12/2021    The 10-year ASCVD risk score (Arnett DK, et al., 2019) is: 16.3%   Values used to calculate the score:     Age: 25 years     Sex: Female     Is Non-Hispanic African American: No     Diabetic: Yes     Tobacco smoker: No     Systolic Blood Pressure: 242 mmHg     Is BP treated: Yes     HDL Cholesterol: 45.2 mg/dL     Total Cholesterol: 142 mg/dL  I reviewed the patients updated PMH, FH, and SocHx.    Patient Active Problem List   Diagnosis Date Noted   Stage 3a chronic kidney disease ([place]) 06/28/2021    Priority: High   History of renal cell carcinoma 12/14/2020    Priority: High   Status post nephrectomy 10/24/2020    Priority: High   OSA on CPAP 10/26/2018    Priority: High   Hx of laparoscopic gastric banding+ truncal vagotomy 03/24/2006 01/10/2014    Priority: High   Spinal stenosis of lumbar region 07/08/2013    Priority: High   Major depression, chronic 07/08/2013  Priority: High   Essential hypertension 04/06/2012    Priority: High   Acquired hypothyroidism 10/08/2011    Priority: High   Mixed hyperlipidemia 10/08/2011    Priority: High   Obesity (BMI 30-39.9) 12/17/2010    Priority: High   S/P knee replacement 04/02/2019    Priority: Medium    Restless legs 12/20/2010    Priority: Medium    Osteoarthritis of multiple joints 12/17/2010    Priority: Medium    Vitamin B12 deficiency 06/02/2020    Priority: Low   Contact dermatitis due to chemicals 01/25/2019    Priority: Low   Generalized hyperhidrosis 07/15/2016    Priority: Low   AR (allergic rhinitis) 12/20/2010    Priority: Low   Status post total replacement of right hip 05/11/2021   Status post total replacement of left hip 02/09/2021    Allergies: Patient has no known  allergies.  Social History: Patient  reports that she has never smoked. She has never used smokeless tobacco. She reports current alcohol use. She reports that she does not use drugs.  No outpatient medications have been marked as taking for the 07/17/22 encounter (Office Visit) with Leamon Arnt, MD.    Review of Systems: Cardiovascular: negative for chest pain, palpitations, leg swelling, orthopnea Respiratory: negative for SOB, wheezing or persistent cough Gastrointestinal: negative for abdominal pain Genitourinary: negative for dysuria or gross hematuria  Objective  Vitals: BP 116/60   Pulse 79   Temp 98 F (36.7 C)   Ht '5\' 5"'$  (1.651 m)   Wt 242 lb 6.4 oz (110 kg)   SpO2 96%   BMI 40.34 kg/m  General: no acute distress  Psych:  Alert and oriented, normal mood and affect HEENT:  Normocephalic, atraumatic, supple neck  Cardiovascular:  RRR without murmur. no edema Respiratory:  Good breath sounds bilaterally, CTAB with normal respiratory effort Skin:  Warm, no rashes Neurologic:   Mental status is normal Commons side effects, risks, benefits, and alternatives for medications and treatment plan prescribed today were discussed, and the patient expressed understanding of the given instructions. Patient is instructed to call or message via MyChart if he/she has any questions or concerns regarding our treatment plan. No barriers to understanding were identified. We discussed Red Flag symptoms and signs in detail. Patient expressed understanding regarding what to do in case of urgent or emergency type symptoms.  Medication list was reconciled, printed and provided to the patient in AVS. Patient instructions and summary information was reviewed with the patient as documented in the AVS. This note was prepared with assistance of Dragon voice recognition software. Occasional wrong-word or sound-a-like substitutions may have occurred due to the inherent limitations of voice recognition  software  This visit occurred during the SARS-CoV-2 public health emergency.  Safety protocols were in place, including screening questions prior to the visit, additional usage of staff PPE, and extensive cleaning of exam room while observing appropriate contact time as indicated for disinfecting solutions.

## 2022-07-18 LAB — LIPID PANEL
Cholesterol: 146 mg/dL (ref 0–200)
HDL: 48.9 mg/dL (ref >39.00)
NonHDL: 96.85
Total CHOL/HDL Ratio: 3
Triglycerides: 221 mg/dL — ABNORMAL HIGH (ref 0.0–149.0)
VLDL: 44.2 mg/dL — ABNORMAL HIGH (ref 0.0–40.0)

## 2022-07-18 LAB — CBC WITH DIFFERENTIAL/PLATELET
Basophils Absolute: 0.1 10*3/uL (ref 0.0–0.1)
Basophils Relative: 1.2 % (ref 0.0–3.0)
Eosinophils Absolute: 0.2 10*3/uL (ref 0.0–0.7)
Eosinophils Relative: 2.6 % (ref 0.0–5.0)
HCT: 41.3 % (ref 36.0–46.0)
Hemoglobin: 14.1 g/dL (ref 12.0–15.0)
Lymphocytes Relative: 30.6 % (ref 12.0–46.0)
Lymphs Abs: 2.7 10*3/uL (ref 0.7–4.0)
MCHC: 34.1 g/dL (ref 30.0–36.0)
MCV: 87.5 fl (ref 78.0–100.0)
Monocytes Absolute: 0.7 10*3/uL (ref 0.1–1.0)
Monocytes Relative: 8.5 % (ref 3.0–12.0)
Neutro Abs: 5 10*3/uL (ref 1.4–7.7)
Neutrophils Relative %: 57.1 % (ref 43.0–77.0)
Platelets: 298 10*3/uL (ref 150.0–400.0)
RBC: 4.72 Mil/uL (ref 3.87–5.11)
RDW: 14.2 % (ref 11.5–15.5)
WBC: 8.7 10*3/uL (ref 4.0–10.5)

## 2022-07-18 LAB — VITAMIN D 25 HYDROXY (VIT D DEFICIENCY, FRACTURES): VITD: 53.77 ng/mL (ref 30.00–100.00)

## 2022-07-18 LAB — LDL CHOLESTEROL, DIRECT: Direct LDL: 80 mg/dL

## 2022-07-18 LAB — COMPREHENSIVE METABOLIC PANEL
ALT: 12 U/L (ref 0–35)
AST: 16 U/L (ref 0–37)
Albumin: 4.3 g/dL (ref 3.5–5.2)
Alkaline Phosphatase: 123 U/L — ABNORMAL HIGH (ref 39–117)
BUN: 22 mg/dL (ref 6–23)
CO2: 26 mEq/L (ref 19–32)
Calcium: 9.6 mg/dL (ref 8.4–10.5)
Chloride: 103 mEq/L (ref 96–112)
Creatinine, Ser: 1.33 mg/dL — ABNORMAL HIGH (ref 0.40–1.20)
GFR: 40.9 mL/min — ABNORMAL LOW (ref >60.00)
Glucose, Bld: 96 mg/dL (ref 70–99)
Potassium: 3.4 mEq/L — ABNORMAL LOW (ref 3.5–5.1)
Sodium: 139 mEq/L (ref 135–145)
Total Bilirubin: 0.4 mg/dL (ref 0.2–1.2)
Total Protein: 7.3 g/dL (ref 6.0–8.3)

## 2022-07-18 LAB — VITAMIN B12: Vitamin B-12: 1191 pg/mL — ABNORMAL HIGH (ref 211–911)

## 2022-07-18 LAB — TSH: TSH: 0.64 u[IU]/mL (ref 0.35–5.50)

## 2022-07-18 LAB — HEMOGLOBIN A1C: Hgb A1c MFr Bld: 6.3 % (ref 4.6–6.5)

## 2022-07-23 NOTE — Progress Notes (Signed)
Pt has not reviewed her results. Please call patient: see the mychart note and let her know the results and my comments. Thanks.

## 2022-08-12 ENCOUNTER — Encounter: Payer: Self-pay | Admitting: *Deleted

## 2022-09-27 ENCOUNTER — Other Ambulatory Visit: Payer: Self-pay | Admitting: Family Medicine

## 2022-10-31 ENCOUNTER — Encounter: Payer: Self-pay | Admitting: *Deleted

## 2022-12-09 DIAGNOSIS — H1045 Other chronic allergic conjunctivitis: Secondary | ICD-10-CM | POA: Diagnosis not present

## 2022-12-09 DIAGNOSIS — H04123 Dry eye syndrome of bilateral lacrimal glands: Secondary | ICD-10-CM | POA: Diagnosis not present

## 2022-12-09 DIAGNOSIS — Z961 Presence of intraocular lens: Secondary | ICD-10-CM | POA: Diagnosis not present

## 2022-12-09 DIAGNOSIS — H40012 Open angle with borderline findings, low risk, left eye: Secondary | ICD-10-CM | POA: Diagnosis not present

## 2022-12-23 ENCOUNTER — Other Ambulatory Visit: Payer: Self-pay | Admitting: Urology

## 2022-12-23 DIAGNOSIS — C642 Malignant neoplasm of left kidney, except renal pelvis: Secondary | ICD-10-CM

## 2022-12-23 DIAGNOSIS — K862 Cyst of pancreas: Secondary | ICD-10-CM

## 2023-01-03 DIAGNOSIS — G4733 Obstructive sleep apnea (adult) (pediatric): Secondary | ICD-10-CM | POA: Diagnosis not present

## 2023-01-21 ENCOUNTER — Ambulatory Visit: Payer: Medicare PPO | Admitting: Family Medicine

## 2023-01-21 VITALS — BP 128/70 | HR 77 | Temp 98.2°F | Ht 65.0 in | Wt 245.6 lb

## 2023-01-21 DIAGNOSIS — I10 Essential (primary) hypertension: Secondary | ICD-10-CM

## 2023-01-21 DIAGNOSIS — C642 Malignant neoplasm of left kidney, except renal pelvis: Secondary | ICD-10-CM | POA: Diagnosis not present

## 2023-01-21 DIAGNOSIS — F339 Major depressive disorder, recurrent, unspecified: Secondary | ICD-10-CM | POA: Diagnosis not present

## 2023-01-21 DIAGNOSIS — N1831 Chronic kidney disease, stage 3a: Secondary | ICD-10-CM | POA: Diagnosis not present

## 2023-01-21 DIAGNOSIS — Z23 Encounter for immunization: Secondary | ICD-10-CM

## 2023-01-21 DIAGNOSIS — R7303 Prediabetes: Secondary | ICD-10-CM | POA: Insufficient documentation

## 2023-01-21 LAB — BASIC METABOLIC PANEL
BUN: 19 mg/dL (ref 6–23)
CO2: 26 mEq/L (ref 19–32)
Calcium: 9.6 mg/dL (ref 8.4–10.5)
Chloride: 105 mEq/L (ref 96–112)
Creatinine, Ser: 1.24 mg/dL — ABNORMAL HIGH (ref 0.40–1.20)
GFR: 44.33 mL/min — ABNORMAL LOW (ref >60.00)
Glucose, Bld: 107 mg/dL — ABNORMAL HIGH (ref 70–99)
Potassium: 3.8 mEq/L (ref 3.5–5.1)
Sodium: 141 mEq/L (ref 135–145)

## 2023-01-21 LAB — POCT GLYCOSYLATED HEMOGLOBIN (HGB A1C): Hemoglobin A1C: 5.8 % — AB (ref 4.0–5.6)

## 2023-01-21 NOTE — Patient Instructions (Signed)
Please return in 6 months for your annual complete physical; please come fasting.   I will release your lab results to you on your MyChart account with further instructions. You may see the results before I do, but when I review them I will send you a message with my report or have my assistant call you if things need to be discussed. Please reply to my message with any questions. Thank you!   If you have any questions or concerns, please don't hesitate to send me a message via MyChart or call the office at [phone]. Thank you for visiting with us today! It's our pleasure caring for you.  

## 2023-01-21 NOTE — Progress Notes (Signed)
Subjective  CC:  Chief Complaint  Patient presents with   Hypertension   Prediabetes    HPI: Veronica Finley is a 72 y.o. female who presents to the office today for follow up of diabetes and problems listed above in the chief complaint.  Prediabetes: Here for 11-monthrecheck.  Last A1c was 6.3.  Diet is fair.  She is working more hours and thus is on her feet more.  A little bit more active.  No symptoms of hypoglycemia Hypertension: Well-controlled on current medications.  Had mild hypokalemia on HCTZ 6 months ago.  Here for recheck.  No chest pain or shortness of breath.  She will get some mild dependent edema after working. Depression is well-controlled on Prozac 20 daily. Obesity: Weight continues to trend upward. Stage III chronic kidney disease: Status post nephrectomy for renal cell carcinoma for surveillance MRI next week.  Wt Readings from Last 3 Encounters:  01/21/23 245 lb 9.6 oz (111.4 kg)  07/17/22 242 lb 6.4 oz (110 kg)  06/28/21 242 lb (109.8 kg)    BP Readings from Last 3 Encounters:  01/21/23 128/70  07/17/22 116/60  06/28/21 123/83    Assessment  1. Prediabetes   2. Essential hypertension   3. Stage 3a chronic kidney disease ([place]   4. Major depression, recurrent, chronic (HCC) Chronic  5. Morbid obesity (HCC) Chronic  6. Renal cell carcinoma, left (HCC) Chronic     Plan  Prediabetes is currently improved controlled.  Education about diet and exercise and weight loss given Hypertension is well-controlled in setting of chronic kidney disease.  Mild hypokalemia.  Will recheck renal function and electrolytes today.  No change in medications Continue Prozac for mood control.  Well-controlled Follow-up MRI for renal cell carcinoma surveillance. Flu shot updated today  Follow up: 6 months for complete physical. Orders Placed This Encounter  Procedures   Basic metabolic panel   No orders of the defined types were placed in this encounter.      Immunization History  Administered Date(s) Administered   Fluad Quad(high Dose 65+) 09/23/2020   Influenza Inj Mdck Quad Pf 12/15/2014, 01/15/2016   Influenza, High Dose Seasonal PF 07/21/2018   Influenza, Seasonal, Injecte, Preservative Fre 11/19/2007   Influenza,inj,Quad PF,6+ Mos 10/20/2017   Influenza-Unspecified 10/08/2011   PFIZER(Purple Top)SARS-COV-2 Vaccination 01/27/2020, 02/17/2020, 12/15/2020   PPD Test 11/19/2007   Pneumococcal Conjugate-13 05/17/2020   Pneumococcal Polysaccharide-23 07/17/2022   Tdap 11/19/2007, 07/21/2018   Zoster Recombinat (Shingrix) 12/19/2020, 02/16/2022   Zoster, Live 07/11/2014    Diabetes Related Lab Review: Lab Results  Component Value Date   HGBA1C 6.3 07/17/2022   HGBA1C 5.8 (H) 05/07/2021   HGBA1C 6.0 01/09/2021    No results found for: "MICROALBUR", "[id number]" Lab Results  Component Value Date   CREATININE 1.33 (H) 07/17/2022   BUN 22 07/17/2022   NA 139 07/17/2022   K 3.4 (L) 07/17/2022   CL 103 07/17/2022   CO2 26 07/17/2022   Lab Results  Component Value Date   CHOL 146 07/17/2022   CHOL 142 12/14/2020   CHOL 122 05/17/2020   Lab Results  Component Value Date   HDL 48.90 07/17/2022   HDL 45.20 12/14/2020   HDL 47.40 05/17/2020   Lab Results  Component Value Date   LDLCALC 63 12/14/2020   LDLCALC 57 05/17/2020   LDLCALC 50 10/26/2018   Lab Results  Component Value Date   TRIG 221.0 (H) 07/17/2022   TRIG 171.0 (H) 12/14/2020  TRIG 91.0 05/17/2020   Lab Results  Component Value Date   CHOLHDL 3 07/17/2022   CHOLHDL 3 12/14/2020   CHOLHDL 3 05/17/2020   Lab Results  Component Value Date   LDLDIRECT 80.0 07/17/2022   The 10-year ASCVD risk score (Arnett DK, et al., 2019) is: 19.3%   Values used to calculate the score:     Age: 4 years     Sex: Female     Is Non-Hispanic African American: No     Diabetic: Yes     Tobacco smoker: No     Systolic Blood Pressure: 0000000 mmHg     Is BP treated: Yes      HDL Cholesterol: 48.9 mg/dL     Total Cholesterol: 146 mg/dL I have reviewed the PMH, Fam and Soc history. Patient Active Problem List   Diagnosis Date Noted   Prediabetes 01/21/2023    Priority: High   Morbid obesity ([place]) 07/17/2022    Priority: High   Stage 3a chronic kidney disease ([place]) 06/28/2021    Priority: High   History of renal cell carcinoma 12/14/2020    Priority: High   Status post nephrectomy 10/24/2020    Priority: High   OSA on CPAP 10/26/2018    Priority: High   Hx of laparoscopic gastric banding+ truncal vagotomy 03/24/2006 01/10/2014    Priority: High   Spinal stenosis of lumbar region 07/08/2013    Priority: High   Major depression, recurrent, chronic ([place]) 07/08/2013    Priority: High   Essential hypertension 04/06/2012    Priority: High   Acquired hypothyroidism 10/08/2011    Priority: High   Mixed hyperlipidemia 10/08/2011    Priority: High   Obesity (BMI 30-39.9) 12/17/2010    Priority: High    Overview:  Obesity - [facility]    S/P knee replacement 04/02/2019    Priority: Medium    Restless leg syndrome 12/20/2010    Priority: Medium    Osteoarthritis of multiple joints 12/17/2010    Priority: Medium    Vitamin B12 deficiency 06/02/2020    Priority: Low   Contact dermatitis due to chemicals 01/25/2019    Priority: Low   Generalized hyperhidrosis 07/15/2016    Priority: Low   AR (allergic rhinitis) 12/20/2010    Priority: Low   Status post total replacement of right hip 05/11/2021   Status post total replacement of left hip 02/09/2021    Social History: Patient  reports that she has never smoked. She has never used smokeless tobacco. She reports current alcohol use. She reports that she does not use drugs.  Review of Systems: Ophthalmic: negative for eye pain, loss of vision or double vision Cardiovascular: negative for chest pain Respiratory: negative for SOB or persistent cough Gastrointestinal: negative for abdominal  pain Genitourinary: negative for dysuria or gross hematuria MSK: negative for foot lesions Neurologic: negative for weakness or gait disturbance  Objective  Vitals: BP 128/70   Pulse 77   Temp 98.2 F (36.8 C)   Ht '5\' 5"'$  (1.651 m)   Wt 245 lb 9.6 oz (111.4 kg)   SpO2 95%   BMI 40.87 kg/m  General: well appearing, no acute distress  Psych:  Alert and oriented, normal mood and affect HEENT:  Normocephalic, atraumatic, moist mucous membranes, supple neck  Cardiovascular:  Nl S1 and S2, RRR without murmur, gallop or rub. no edema Respiratory:  Good breath sounds bilaterally, CTAB with normal effort, no rales Extremities without edema    Diabetic education:  ongoing education regarding chronic disease management for diabetes was given today. We continue to reinforce the ABC's of diabetic management: A1c (<7 or 8 dependent upon patient), tight blood pressure control, and cholesterol management with goal LDL < 100 minimally. We discuss diet strategies, exercise recommendations, medication options and possible side effects. At each visit, we review recommended immunizations and preventive care recommendations for diabetics and stress that good diabetic control can prevent other problems. See below for this patient's data.   Commons side effects, risks, benefits, and alternatives for medications and treatment plan prescribed today were discussed, and the patient expressed understanding of the given instructions. Patient is instructed to call or message via MyChart if he/she has any questions or concerns regarding our treatment plan. No barriers to understanding were identified. We discussed Red Flag symptoms and signs in detail. Patient expressed understanding regarding what to do in case of urgent or emergency type symptoms.  Medication list was reconciled, printed and provided to the patient in AVS. Patient instructions and summary information was reviewed with the patient as documented in the  AVS. This note was prepared with assistance of Dragon voice recognition software. Occasional wrong-word or sound-a-like substitutions may have occurred due to the inherent limitations of voice recognition software

## 2023-01-23 ENCOUNTER — Encounter: Payer: Self-pay | Admitting: Radiology

## 2023-01-30 ENCOUNTER — Ambulatory Visit (INDEPENDENT_AMBULATORY_CARE_PROVIDER_SITE_OTHER): Payer: Medicare PPO

## 2023-01-30 VITALS — Wt 245.0 lb

## 2023-01-30 DIAGNOSIS — Z0000 Encounter for general adult medical examination without abnormal findings: Secondary | ICD-10-CM | POA: Diagnosis not present

## 2023-01-30 NOTE — Patient Instructions (Signed)
Veronica Finley , Thank you for taking time to come for your Medicare Wellness Visit. I appreciate your ongoing commitment to your health goals. Please review the following plan we discussed and let me know if I can assist you in the future.   These are the goals we discussed:  Goals      lose weight and eat healthy     Patient Stated     Lose weight         This is a list of the screening recommended for you and due dates:  Health Maintenance  Topic Date Due   COVID-19 Vaccine (4 - 2023-24 season) 02/06/2023*   Mammogram  03/07/2023   DEXA scan (bone density measurement)  11/21/2023   Medicare Annual Wellness Visit  01/30/2024   Colon Cancer Screening  03/03/2025   DTaP/Tdap/Td vaccine (3 - Td or Tdap) 07/21/2028   Pneumonia Vaccine  Completed   Flu Shot  Completed   Hepatitis C Screening: USPSTF Recommendation to screen - Ages 18-79 yo.  Completed   Zoster (Shingles) Vaccine  Completed   HPV Vaccine  Aged Out  *Topic was postponed. The date shown is not the original due date.    Advanced directives: Advance directive discussed with you today. Even though you declined this today please call our office should you change your mind and we can give you the proper paperwork for you to fill out.  Conditions/risks identified: lose some weight   Next appointment: Follow up in one year for your annual wellness visit    Preventive Care 65 Years and Older, Female Preventive care refers to lifestyle choices and visits with your health care provider that can promote health and wellness. What does preventive care include? A yearly physical exam. This is also called an annual well check. Dental exams once or twice a year. Routine eye exams. Ask your health care provider how often you should have your eyes checked. Personal lifestyle choices, including: Daily care of your teeth and gums. Regular physical activity. Eating a healthy diet. Avoiding tobacco and drug use. Limiting alcohol  use. Practicing safe sex. Taking low-dose aspirin every day. Taking vitamin and mineral supplements as recommended by your health care provider. What happens during an annual well check? The services and screenings done by your health care provider during your annual well check will depend on your age, overall health, lifestyle risk factors, and family history of disease. Counseling  Your health care provider may ask you questions about your: Alcohol use. Tobacco use. Drug use. Emotional well-being. Home and relationship well-being. Sexual activity. Eating habits. History of falls. Memory and ability to understand (cognition). Work and work Statistician. Reproductive health. Screening  You may have the following tests or measurements: Height, weight, and BMI. Blood pressure. Lipid and cholesterol levels. These may be checked every 5 years, or more frequently if you are over 65 years old. Skin check. Lung cancer screening. You may have this screening every year starting at age 7 if you have a 30-pack-year history of smoking and currently smoke or have quit within the past 15 years. Fecal occult blood test (FOBT) of the stool. You may have this test every year starting at age 42. Flexible sigmoidoscopy or colonoscopy. You may have a sigmoidoscopy every 5 years or a colonoscopy every 10 years starting at age 40. Hepatitis C blood test. Hepatitis B blood test. Sexually transmitted disease (STD) testing. Diabetes screening. This is done by checking your blood sugar (glucose) after you have  not eaten for a while (fasting). You may have this done every 1-3 years. Bone density scan. This is done to screen for osteoporosis. You may have this done starting at age 26. Mammogram. This may be done every 1-2 years. Talk to your health care provider about how often you should have regular mammograms. Talk with your health care provider about your test results, treatment options, and if necessary,  the need for more tests. Vaccines  Your health care provider may recommend certain vaccines, such as: Influenza vaccine. This is recommended every year. Tetanus, diphtheria, and acellular pertussis (Tdap, Td) vaccine. You may need a Td booster every 10 years. Zoster vaccine. You may need this after age 82. Pneumococcal 13-valent conjugate (PCV13) vaccine. One dose is recommended after age 40. Pneumococcal polysaccharide (PPSV23) vaccine. One dose is recommended after age 77. Talk to your health care provider about which screenings and vaccines you need and how often you need them. This information is not intended to replace advice given to you by your health care provider. Make sure you discuss any questions you have with your health care provider. Document Released: 12/01/2015 Document Revised: 07/24/2016 Document Reviewed: 09/05/2015 Elsevier Interactive Patient Education  2017 [employer] Prevention in the Home Falls can cause injuries. They can happen to people of all ages. There are many things you can do to make your home safe and to help prevent falls. What can I do on the outside of my home? Regularly fix the edges of walkways and driveways and fix any cracks. Remove anything that might make you trip as you walk through a door, such as a raised step or threshold. Trim any bushes or trees on the path to your home. Use bright outdoor lighting. Clear any walking paths of anything that might make someone trip, such as rocks or tools. Regularly check to see if handrails are loose or broken. Make sure that both sides of any steps have handrails. Any raised decks and porches should have guardrails on the edges. Have any leaves, snow, or ice cleared regularly. Use sand or salt on walking paths during winter. Clean up any spills in your garage right away. This includes oil or grease spills. What can I do in the bathroom? Use night lights. Install grab bars by the toilet and in the  tub and shower. Do not use towel bars as grab bars. Use non-skid mats or decals in the tub or shower. If you need to sit down in the shower, use a plastic, non-slip stool. Keep the floor dry. Clean up any water that spills on the floor as soon as it happens. Remove soap buildup in the tub or shower regularly. Attach bath mats securely with double-sided non-slip rug tape. Do not have throw rugs and other things on the floor that can make you trip. What can I do in the bedroom? Use night lights. Make sure that you have a light by your bed that is easy to reach. Do not use any sheets or blankets that are too big for your bed. They should not hang down onto the floor. Have a firm chair that has side arms. You can use this for support while you get dressed. Do not have throw rugs and other things on the floor that can make you trip. What can I do in the kitchen? Clean up any spills right away. Avoid walking on wet floors. Keep items that you use a lot in easy-to-reach places. If you need to  reach something above you, use a strong step stool that has a grab bar. Keep electrical cords out of the way. Do not use floor polish or wax that makes floors slippery. If you must use wax, use non-skid floor wax. Do not have throw rugs and other things on the floor that can make you trip. What can I do with my stairs? Do not leave any items on the stairs. Make sure that there are handrails on both sides of the stairs and use them. Fix handrails that are broken or loose. Make sure that handrails are as long as the stairways. Check any carpeting to make sure that it is firmly attached to the stairs. Fix any carpet that is loose or worn. Avoid having throw rugs at the top or bottom of the stairs. If you do have throw rugs, attach them to the floor with carpet tape. Make sure that you have a light switch at the top of the stairs and the bottom of the stairs. If you do not have them, ask someone to add them for  you. What else can I do to help prevent falls? Wear shoes that: Do not have high heels. Have rubber bottoms. Are comfortable and fit you well. Are closed at the toe. Do not wear sandals. If you use a stepladder: Make sure that it is fully opened. Do not climb a closed stepladder. Make sure that both sides of the stepladder are locked into place. Ask someone to hold it for you, if possible. Clearly mark and make sure that you can see: Any grab bars or handrails. First and last steps. Where the edge of each step is. Use tools that help you move around (mobility aids) if they are needed. These include: Canes. Walkers. Scooters. Crutches. Turn on the lights when you go into a dark area. Replace any light bulbs as soon as they burn out. Set up your furniture so you have a clear path. Avoid moving your furniture around. If any of your floors are uneven, fix them. If there are any pets around you, be aware of where they are. Review your medicines with your doctor. Some medicines can make you feel dizzy. This can increase your chance of falling. Ask your doctor what other things that you can do to help prevent falls. This information is not intended to replace advice given to you by your health care provider. Make sure you discuss any questions you have with your health care provider. Document Released: 08/31/2009 Document Revised: 04/11/2016 Document Reviewed: 12/09/2014 Elsevier Interactive Patient Education  2017 [employer].

## 2023-01-30 NOTE — Progress Notes (Signed)
I connected with  Veronica Finley on 01/30/23 by a audio enabled telemedicine application and verified that I am speaking with the correct person using two identifiers.    Patient Medicare AWV questionnaire was completed by the patient on 01/26/23; I have confirmed that all information answered by patient is correct and no changes since this date.     Patient Location: Home  Provider Location: Office/Clinic  I discussed the limitations of evaluation and management by telemedicine. The patient expressed understanding and agreed to proceed.   Subjective:   Veronica Finley is a 72 y.o. female who presents for Medicare Annual (Subsequent) preventive examination.  Review of Systems     Cardiac Risk Factors include: advanced age (>61mn, >>43women);dyslipidemia;hypertension;obesity (BMI >30kg/m2)     Objective:    Today's Vitals   01/30/23 1138  Weight: 245 lb (111.1 kg)   Body mass index is 40.77 kg/m.     01/30/2023   11:43 AM 01/17/2022   11:45 AM 05/11/2021   11:47 AM 05/07/2021   10:48 AM 02/09/2021    3:00 PM 01/29/2021    9:10 AM 11/23/2020    7:00 AM  Advanced Directives  Does Patient Have a Medical Advance Directive? No No No No No No No  Would patient like information on creating a medical advance directive? No - Patient declined Yes (MAU/Ambulatory/Procedural Areas - Information given) No - Patient declined  No - Patient declined No - Patient declined No - Patient declined    Current Medications (verified) Outpatient Encounter Medications as of 01/30/2023  Medication Sig   acetaminophen (TYLENOL) 500 MG tablet Take 1,000 mg by mouth every 6 (six) hours as needed for moderate pain.   cholecalciferol (VITAMIN D3) 25 MCG (1000 UNIT) tablet Take 1,000 Units by mouth daily.   diltiazem (CARDIZEM CD) 240 MG 24 hr capsule Take 1 capsule (240 mg total) by mouth daily.   FLUoxetine (PROZAC) 20 MG capsule TAKE 1 CAPSULE EVERY DAY (NEED MD APPOINTMENT)   levothyroxine  (SYNTHROID) 150 MCG tablet TAKE 1 TABLET EVERY DAY   losartan-hydrochlorothiazide (HYZAAR) 100-25 MG tablet Take 1 tablet by mouth daily.   rOPINIRole (REQUIP XL) 4 MG 24 hr tablet Take 1 tablet (4 mg total) by mouth at bedtime.   rosuvastatin (CRESTOR) 10 MG tablet Take 1 tablet (10 mg total) by mouth at bedtime.   vitamin B-12 (CYANOCOBALAMIN) 1000 MCG tablet Take 1,000 mcg by mouth daily.   vitamin C (ASCORBIC ACID) 500 MG tablet Take 500 mg by mouth 3 (three) times a week.   No facility-administered encounter medications on file as of 01/30/2023.    Allergies (verified) Codeine and Thimerosal   History: Past Medical History:  Diagnosis Date   Anemia    Arthritis    [place]    COVID-19 08/28/2020   Depression    Family history of adverse reaction to anesthesia    mother and daughter have severe PONV   Hyperlipidemia    Hypertension    Hypothyroidism    Lumbar spinal stenosis 07/08/2013   OSA on CPAP 10/26/2018   Pre-diabetes    Renal mass 09/2020   Past Surgical History:  Procedure Laterality Date   GASTRIC BYPASS     KNEE ARTHROSCOPY     ROBOTIC ASSITED PARTIAL NEPHRECTOMY Left 09/22/2020   Procedure: XI ROBOTIC [place] RESECTION OF ABDOMINAL MASS;  Surgeon: WCeasar Mons MD;  Location: [facility];  Service: Urology;  Laterality: Left;   SPINE SURGERY  TONSILLECTOMY     TOTAL HIP ARTHROPLASTY Left 02/09/2021   Procedure: LEFT TOTAL HIP ARTHROPLASTY ANTERIOR APPROACH;  Surgeon: Mcarthur Rossetti, MD;  Location: [facility];  Service: Orthopedics;  Laterality: Left;   TOTAL HIP ARTHROPLASTY Right 05/11/2021   Procedure: RIGHT TOTAL HIP ARTHROPLASTY ANTERIOR APPROACH;  Surgeon: Mcarthur Rossetti, MD;  Location: [facility];  Service: Orthopedics;  Laterality: Right;   TOTAL KNEE ARTHROPLASTY Right 04/02/2019   Procedure: TOTAL KNEE ARTHROPLASTY;  Surgeon: Sydnee Cabal, MD;  Location: [facility];  Service: Orthopedics;   Laterality: Right;  spinal plus adductor canal   TOTAL KNEE ARTHROPLASTY Left 08/20/2019   Procedure: TOTAL KNEE ARTHROPLASTY;  Surgeon: Sydnee Cabal, MD;  Location: [facility];  Service: Orthopedics;  Laterality: Left;  with adductor canal   TUBAL LIGATION     UVULECTOMY     VENTRAL HERNIA REPAIR N/A 11/23/2020   Procedure: VENTRAL HERNIA REPAIR WITH MESH;  Surgeon: Coralie Keens, MD;  Location: [place];  Service: General;  Laterality: N/A;  LMA   Family History  Problem Relation Age of Onset   Arthritis Mother    Atrial fibrillation Mother    Hyperlipidemia Mother    Hypertension Mother    Leukemia Father    Heart disease Father    Arthritis Sister    Arthritis Brother    Diabetes Brother    Hyperlipidemia Brother    Allergic rhinitis Neg Hx    Angioedema Neg Hx    Asthma Neg Hx    Eczema Neg Hx    Immunodeficiency Neg Hx    Urticaria Neg Hx    Social History   Socioeconomic History   Marital status: Single    Spouse name: Not on file   Number of children: Not on file   Years of education: Not on file   Highest education level: Not on file  Occupational History   Not on file  Tobacco Use   Smoking status: Never   Smokeless tobacco: Never  Vaping Use   Vaping Use: Never used  Substance and Sexual Activity   Alcohol use: Yes    Comment: rarely   Drug use: No   Sexual activity: Never    Birth control/protection: Post-menopausal  Other Topics Concern   Not on file  Social History Narrative   Not on file   Social Determinants of Health   Financial Resource Strain: Low Risk  (01/26/2023)   Overall Financial Resource Strain (CARDIA)    Difficulty of Paying Living Expenses: Not hard at all  Food Insecurity: No Food Insecurity (01/26/2023)   Hunger Vital Sign    Worried About Running Out of Food in the Last Year: Never true    Ran Out of Food in the Last Year: Never true  Transportation Needs: No Transportation Needs (01/26/2023)   PRAPARE -  Hydrologist (Medical): No    Lack of Transportation (Non-Medical): No  Physical Activity: Insufficiently Active (01/26/2023)   Exercise Vital Sign    Days of Exercise per Week: 1 day    Minutes of Exercise per Session: 10 min  Stress: No Stress Concern Present (01/26/2023)   [place]    Feeling of Stress : Not at all  Social Connections: Moderately Isolated (01/26/2023)   Social Connection and Isolation Panel [NHANES]    Frequency of Communication with Friends and Family: More than three times a week    Frequency of Social Gatherings  with Friends and Family: Three times a week    Attends Religious Services: Never    Active Member of Clubs or Organizations: Yes    Attends Music therapist: More than 4 times per year    Marital Status: Divorced    Tobacco Counseling Counseling given: Not Answered   Clinical Intake:  Pre-visit preparation completed: Yes  Pain : No/denies pain     BMI - recorded: 40.77 Nutritional Status: BMI > 30  Obese Nutritional Risks: None Diabetes: No  How often do you need to have someone help you when you read instructions, pamphlets, or other written materials from your doctor or pharmacy?: 1 - Never  Diabetic?no  Interpreter Needed?: No  Information entered by :: Charlott Rakes, LPN   Activities of Daily Living    01/26/2023    9:46 AM  In your present state of health, do you have any difficulty performing the following activities:  Hearing? 0  Vision? 0  Difficulty concentrating or making decisions? 0  Walking or climbing stairs? 0  Dressing or bathing? 0  Doing errands, shopping? 0  Preparing Food and eating ? N  Using the Toilet? N  In the past six months, have you accidently leaked urine? N  Do you have problems with loss of bowel control? N  Managing your Medications? N  Managing your Finances? N  Housekeeping or managing  your Housekeeping? N    Patient Care Team: Leamon Arnt, MD as PCP - General (Family Medicine) Sydnee Cabal, MD as Consulting Physician (Orthopedic Surgery)  Indicate any recent Medical Services you may have received from other than [facility] providers in the past year (date may be approximate).     Assessment:   This is a routine wellness examination for [place].  Hearing/Vision screen Hearing Screening - Comments:: Pt denies any hearing issues  Vision Screening - Comments:: Pt follows up with dr Midge Aver for annual eye exams   Dietary issues and exercise activities discussed: Current Exercise Habits: Home exercise routine, Type of exercise: walking, Time (Minutes): 10, Frequency (Times/Week): 1, Weekly Exercise (Minutes/Week): 10   Goals Addressed             This Visit's Progress    Patient Stated       Lose weight        Depression Screen    01/30/2023   11:41 AM 01/21/2023    9:13 AM 01/17/2022   11:43 AM 06/28/2021   10:57 AM 12/14/2020   10:28 AM 02/22/2020   11:11 AM 04/15/2019    3:18 PM  PHQ 2/9 Scores  PHQ - 2 Score 0 0 0 0 0 0 0  PHQ- 9 Score    0 0      Fall Risk    01/26/2023    9:46 AM 01/21/2023    9:13 AM 01/17/2022   11:47 AM 06/28/2021   10:58 AM 02/22/2020   11:10 AM  Fall Risk   Falls in the past year? 0 0 1 0 0  Number falls in past yr: 0 0 1 0 0  Injury with Fall? 0 0 0 0 0  Comment   fell out of rolling chair    Risk for fall due to : Impaired vision No Fall Risks Impaired vision No Fall Risks Orthopedic patient  Follow up Falls prevention discussed  Falls prevention discussed  Falls evaluation completed;Education provided;Falls prevention discussed    FALL RISK PREVENTION PERTAINING TO THE HOME:  Any stairs in  or around the home? No  If so, are there any without handrails? No  Home free of loose throw rugs in walkways, pet beds, electrical cords, etc? Yes  Adequate lighting in your home to reduce risk of falls? Yes   ASSISTIVE DEVICES  UTILIZED TO PREVENT FALLS:  Life alert? No  Use of a cane, walker or w/c? No  Grab bars in the bathroom? Yes  Shower chair or bench in shower? Yes  Elevated toilet seat or a handicapped toilet? No   TIMED UP AND GO:  Was the test performed? No .  fCognitive Function:        01/30/2023   11:44 AM 01/17/2022   11:49 AM 02/22/2020   11:10 AM  6CIT Screen  What Year? 0 points 0 points 0 points  What month? 0 points 0 points 0 points  What time? 0 points 0 points 0 points  Count back from 20 0 points 0 points 0 points  Months in reverse 0 points 0 points 0 points  Repeat phrase 0 points 0 points 0 points  Total Score 0 points 0 points 0 points    Immunizations Immunization History  Administered Date(s) Administered   Fluad Quad(high Dose 65+) 09/23/2020, 01/21/2023   Influenza Inj Mdck Quad Pf 12/15/2014, 01/15/2016   Influenza, High Dose Seasonal PF 07/21/2018   Influenza, Seasonal, Injecte, Preservative Fre 11/19/2007   Influenza,inj,Quad PF,6+ Mos 10/20/2017   Influenza-Unspecified 10/08/2011   PFIZER(Purple Top)SARS-COV-2 Vaccination 01/27/2020, 02/17/2020, 12/15/2020   PPD Test 11/19/2007   Pneumococcal Conjugate-13 05/17/2020   Pneumococcal Polysaccharide-23 07/17/2022   Tdap 11/19/2007, 07/21/2018   Zoster Recombinat (Shingrix) 12/19/2020, 02/16/2022   Zoster, Live 07/11/2014    TDAP status: Up to date  Flu Vaccine status: Up to date  Pneumococcal vaccine status: Up to date  Covid-19 vaccine status: Completed vaccines  Qualifies for Shingles Vaccine? Yes   Zostavax completed Yes   Shingrix Completed?: Yes  Screening Tests Health Maintenance  Topic Date Due   COVID-19 Vaccine (4 - 2023-24 season) 02/06/2023 (Originally 07/19/2022)   MAMMOGRAM  03/07/2023   DEXA SCAN  11/21/2023   Medicare Annual Wellness (AWV)  01/30/2024   COLONOSCOPY (Pts 45-52yr Insurance coverage will need to be confirmed)  03/03/2025   DTaP/Tdap/Td (3 - Td or Tdap) 07/21/2028    Pneumonia Vaccine 70 Years old  Completed   INFLUENZA VACCINE  Completed   Hepatitis C Screening  Completed   Zoster Vaccines- Shingrix  Completed   HPV VACCINES  Aged Out    Health Maintenance  There are no preventive care reminders to display for this patient.   Colorectal cancer screening: Type of screening: Colonoscopy. Completed 03/04/15. Repeat every 10 years  Mammogram status: Completed 03/06/22. Repeat every year  Bone Density status: Completed 11/20/18. Results reflect: Bone density results: NORMAL. Repeat every 5 years.   Additional Screening:  Hepatitis C Screening: Completed 06/23/16  Vision Screening: Recommended annual ophthalmology exams for early detection of glaucoma and other disorders of the eye. Is the patient up to date with their annual eye exam?  Yes  Who is the provider or what is the name of the office in which the patient attends annual eye exams? Dr CMidge Aver If pt is not established with a provider, would they like to be referred to a provider to establish care? No .   Dental Screening: Recommended annual dental exams for proper oral hygiene  Community Resource Referral / Chronic Care Management: CRR required this visit?  No  CCM required this visit?  No      Plan:     I have personally reviewed and noted the following in the patient's chart:   Medical and social history Use of alcohol, tobacco or illicit drugs  Current medications and supplements including opioid prescriptions. Patient is not currently taking opioid prescriptions. Functional ability and status Nutritional status Physical activity Advanced directives List of other physicians Hospitalizations, surgeries, and ER visits in previous 12 months Vitals Screenings to include cognitive, depression, and falls Referrals and appointments  In addition, I have reviewed and discussed with patient certain preventive protocols, quality metrics, and best practice recommendations. A  written personalized care plan for preventive services as well as general preventive health recommendations were provided to patient.     Willette Brace, LPN   [phone]   Nurse Notes: none

## 2023-02-03 ENCOUNTER — Ambulatory Visit
Admission: RE | Admit: 2023-02-03 | Discharge: 2023-02-03 | Disposition: A | Discharge status: Home / Self Care | Payer: Medicare PPO | Source: Ambulatory Visit | Attending: Urology | Admitting: Urology

## 2023-02-03 DIAGNOSIS — C642 Malignant neoplasm of left kidney, except renal pelvis: Secondary | ICD-10-CM

## 2023-02-03 DIAGNOSIS — K862 Cyst of pancreas: Secondary | ICD-10-CM

## 2023-02-03 MED ORDER — GADOPICLENOL 0.5 MMOL/ML IV SOLN
10.0000 mL | Freq: Once | INTRAVENOUS | Status: AC | PRN
  Administered 2023-02-03: 10 mL via INTRAVENOUS

## 2023-02-10 DIAGNOSIS — N390 Urinary tract infection, site not specified: Secondary | ICD-10-CM | POA: Diagnosis not present

## 2023-02-10 DIAGNOSIS — C642 Malignant neoplasm of left kidney, except renal pelvis: Secondary | ICD-10-CM | POA: Diagnosis not present

## 2023-05-07 ENCOUNTER — Other Ambulatory Visit: Payer: Self-pay | Admitting: Family Medicine

## 2023-05-08 ENCOUNTER — Other Ambulatory Visit: Payer: Self-pay | Admitting: Family Medicine

## 2023-05-08 NOTE — Telephone Encounter (Signed)
Fluoxetine   LOV: 01/21/2023  Last Refill Date: 06/03/2022  QTY: 90  Refills: 3

## 2023-07-04 DIAGNOSIS — G4733 Obstructive sleep apnea (adult) (pediatric): Secondary | ICD-10-CM | POA: Diagnosis not present

## 2023-07-21 ENCOUNTER — Encounter: Payer: Medicare PPO | Admitting: Family Medicine

## 2023-08-27 ENCOUNTER — Encounter: Payer: Self-pay | Admitting: Family Medicine

## 2023-08-27 ENCOUNTER — Ambulatory Visit: Payer: Medicare PPO | Admitting: Family Medicine

## 2023-08-27 VITALS — BP 130/70 | HR 71 | Temp 98.1°F | Ht 65.0 in | Wt 247.8 lb

## 2023-08-27 DIAGNOSIS — Z6841 Body Mass Index (BMI) 40.0 and over, adult: Secondary | ICD-10-CM | POA: Diagnosis not present

## 2023-08-27 DIAGNOSIS — E538 Deficiency of other specified B group vitamins: Secondary | ICD-10-CM | POA: Diagnosis not present

## 2023-08-27 DIAGNOSIS — G4733 Obstructive sleep apnea (adult) (pediatric): Secondary | ICD-10-CM | POA: Diagnosis not present

## 2023-08-27 DIAGNOSIS — Z0001 Encounter for general adult medical examination with abnormal findings: Secondary | ICD-10-CM

## 2023-08-27 DIAGNOSIS — N1831 Chronic kidney disease, stage 3a: Secondary | ICD-10-CM

## 2023-08-27 DIAGNOSIS — I10 Essential (primary) hypertension: Secondary | ICD-10-CM

## 2023-08-27 DIAGNOSIS — E782 Mixed hyperlipidemia: Secondary | ICD-10-CM | POA: Diagnosis not present

## 2023-08-27 DIAGNOSIS — Z85528 Personal history of other malignant neoplasm of kidney: Secondary | ICD-10-CM

## 2023-08-27 DIAGNOSIS — Z780 Asymptomatic menopausal state: Secondary | ICD-10-CM

## 2023-08-27 DIAGNOSIS — E039 Hypothyroidism, unspecified: Secondary | ICD-10-CM | POA: Diagnosis not present

## 2023-08-27 DIAGNOSIS — R7303 Prediabetes: Secondary | ICD-10-CM | POA: Diagnosis not present

## 2023-08-27 DIAGNOSIS — F339 Major depressive disorder, recurrent, unspecified: Secondary | ICD-10-CM

## 2023-08-27 LAB — CBC WITH DIFFERENTIAL/PLATELET
Basophils Absolute: 0 10*3/uL (ref 0.0–0.1)
Basophils Relative: 0.5 % (ref 0.0–3.0)
Eosinophils Absolute: 0.2 10*3/uL (ref 0.0–0.7)
Eosinophils Relative: 3.1 % (ref 0.0–5.0)
HCT: 43.3 % (ref 36.0–46.0)
Hemoglobin: 14.2 g/dL (ref 12.0–15.0)
Lymphocytes Relative: 26.1 % (ref 12.0–46.0)
Lymphs Abs: 2 10*3/uL (ref 0.7–4.0)
MCHC: 32.8 g/dL (ref 30.0–36.0)
MCV: 89.3 fl (ref 78.0–100.0)
Monocytes Absolute: 0.6 10*3/uL (ref 0.1–1.0)
Monocytes Relative: 7.5 % (ref 3.0–12.0)
Neutro Abs: 4.7 10*3/uL (ref 1.4–7.7)
Neutrophils Relative %: 62.8 % (ref 43.0–77.0)
Platelets: 271 10*3/uL (ref 150.0–400.0)
RBC: 4.85 Mil/uL (ref 3.87–5.11)
RDW: 13.8 % (ref 11.5–15.5)
WBC: 7.5 10*3/uL (ref 4.0–10.5)

## 2023-08-27 LAB — COMPREHENSIVE METABOLIC PANEL WITH GFR
ALT: 15 U/L (ref 0–35)
AST: 19 U/L (ref 0–37)
Albumin: 4.2 g/dL (ref 3.5–5.2)
Alkaline Phosphatase: 109 U/L (ref 39–117)
BUN: 16 mg/dL (ref 6–23)
CO2: 26 meq/L (ref 19–32)
Calcium: 9.7 mg/dL (ref 8.4–10.5)
Chloride: 104 meq/L (ref 96–112)
Creatinine, Ser: 1.23 mg/dL — ABNORMAL HIGH (ref 0.40–1.20)
GFR: 44.57 mL/min — ABNORMAL LOW
Glucose, Bld: 115 mg/dL — ABNORMAL HIGH (ref 70–99)
Potassium: 3.7 meq/L (ref 3.5–5.1)
Sodium: 140 meq/L (ref 135–145)
Total Bilirubin: 0.5 mg/dL (ref 0.2–1.2)
Total Protein: 6.8 g/dL (ref 6.0–8.3)

## 2023-08-27 LAB — LIPID PANEL
Cholesterol: 132 mg/dL (ref 0–200)
HDL: 53.5 mg/dL (ref >39.00)
LDL Cholesterol: 59 mg/dL (ref 0–99)
NonHDL: 78.51
Total CHOL/HDL Ratio: 2
Triglycerides: 98 mg/dL (ref 0.0–149.0)
VLDL: 19.6 mg/dL (ref 0.0–40.0)

## 2023-08-27 LAB — HEMOGLOBIN A1C: Hgb A1c MFr Bld: 6.2 % (ref 4.6–6.5)

## 2023-08-27 LAB — TSH: TSH: 2.09 u[IU]/mL (ref 0.35–5.50)

## 2023-08-27 LAB — VITAMIN B12: Vitamin B-12: 721 pg/mL (ref 211–911)

## 2023-08-27 MED ORDER — LEVOTHYROXINE SODIUM 150 MCG PO TABS: 150.0000 ug | ORAL_TABLET | Freq: Every day | ORAL | 3 refills | Status: DC

## 2023-08-27 NOTE — Patient Instructions (Signed)
Please return in 12 months for your annual complete physical; please come fasting.   I will release your lab results to you on your MyChart account with further instructions. You may see the results before I do, but when I review them I will send you a message with my report or have my assistant call you if things need to be discussed. Please reply to my message with any questions. Thank you!   If you have any questions or concerns, please don't hesitate to send me a message via MyChart or call the office at [phone]. Thank you for visiting with [place] today! It's our pleasure caring for you.   Please call the office checked below to schedule your appointment for your mammogram and/or bone density screen (the checked studies were ordered): [x]   Mammogram  [x]   Bone Density  [x]   [facility]     [address] [place], [place]        [phone]         []   [facility]  [address] [place], [place]  [phone]

## 2023-08-27 NOTE — Progress Notes (Signed)
Subjective  Chief Complaint  Patient presents with   Annual Exam    Pt here for Annual Exam and is currently fasting. Mammogram has not been done yet   Hypertension   Hypothyroidism   Prediabetes    HPI: Veronica Finley is a 72 y.o. female who presents to [facility] [place] today for a Female Wellness Visit. She also has the concerns and/or needs as listed above in the chief complaint. These will be addressed in addition to the Health Maintenance Visit.   Wellness Visit: annual visit with health maintenance review and exam  Patient is overdue for mammogram.  Eligible for bone density screening.  Last done was 5 years ago and was normal.  Working part-time at [employer].  Enjoys it.  On her feet for long hours and sometimes has foot pain.  No other joint problems.  Happy. Chronic disease f/u and/or acute problem visit: (deemed necessary to be done in addition to the wellness visit): Hypertension on Cardizem and Hyzaar and doing well.  No chest pain shortness of breath or lower extremity edema. Remains on Crestor for hyperlipidemia management.  Tolerates well.  Fasting for recheck Prediabetes: Weight is maintained.  Tries to follow a low sugar diet.  No symptoms of hyperglycemia Hypothyroidism has been well-controlled on levothyroxine 150 mcg daily.  Denies symptoms of high or low thyroid. Major chronic depression is well-controlled on Prozac 20 History of renal cell carcinoma and chronic kidney disease.  No edema.  No nausea or vomiting.  Due for recheck.  Assessment  1. Encounter for well adult exam with abnormal findings   2. Essential hypertension   3. Mixed hyperlipidemia   4. Prediabetes   5. Morbid obesity (HCC)   6. Acquired hypothyroidism   7. Major depression, recurrent, chronic (HCC)   8. History of renal cell carcinoma   9. Stage 3a chronic kidney disease (HCC)   10. Vitamin B12 deficiency   11. Asymptomatic menopausal state   12. OSA on CPAP       Plan  Female Wellness Visit: Age appropriate Health Maintenance and Prevention measures were discussed with patient. Included topics are cancer screening recommendations, ways to keep healthy (see AVS) including dietary and exercise recommendations, regular eye and dental care, use of seat belts, and avoidance of moderate alcohol use and tobacco use.  Mammogram and bone density ordered.  Education given BMI: discussed patient's BMI and encouraged positive lifestyle modifications to help get to or maintain a target BMI. HM needs and immunizations were addressed and ordered. See below for orders. See HM and immunization section for updates.  Immunizations are current Routine labs and screening tests ordered including cmp, cbc and lipids where appropriate. Discussed recommendations regarding Vit D and calcium supplementation (see AVS)  Chronic disease management visit and/or acute problem visit: Hypertension is well-controlled on Cardizem CD240 daily and Hyzaar 125.  Check renal function electrolytes Hyperlipidemia on Crestor well-tolerated recheck today fasting Monitoring TSH and levothyroxine 150 mcg daily.  Clinically euthyroid Prediabetes history: Clinically stable.  Recheck today Monitor kidney function.  Clinically stable Less than ropinirole stable CPAP machine needs to be replaced.  She will contact [employer].  Reports good control Recheck vitamin B12 levels on oral supplements  Follow up: 1 year for recheck/CPE Orders Placed This Encounter  Procedures   DG Bone Density   CBC with Differential/Platelet   Comprehensive metabolic panel   Lipid panel   Hemoglobin A1c   TSH   Vitamin  B12   Meds ordered this encounter  Medications   levothyroxine (SYNTHROID) 150 MCG tablet    Sig: Take 1 tablet (150 mcg total) by mouth daily.    Dispense:  90 tablet    Refill:  3      Body mass index is 41.24 kg/m. Wt Readings from Last 3 Encounters:  08/27/23 247 lb 12.8 oz (112.4 kg)   01/30/23 245 lb (111.1 kg)  01/21/23 245 lb 9.6 oz (111.4 kg)     Patient Active Problem List   Diagnosis Date Noted Date Diagnosed   Prediabetes 01/21/2023     Priority: High   Morbid obesity (HCC) 07/17/2022     Priority: High   Stage 3a chronic kidney disease (HCC) 06/28/2021     Priority: High   History of renal cell carcinoma 12/14/2020     Priority: High   Status post nephrectomy 10/24/2020     Priority: High   OSA on CPAP 10/26/2018     Priority: High   Hx of laparoscopic gastric banding+ truncal vagotomy 03/24/2006 01/10/2014     Priority: High   Spinal stenosis of lumbar region 07/08/2013     Priority: High   Major depression, recurrent, chronic (HCC) 07/08/2013     Priority: High   Essential hypertension 04/06/2012     Priority: High   Acquired hypothyroidism 10/08/2011     Priority: High   Mixed hyperlipidemia 10/08/2011     Priority: High   S/P knee replacement 04/02/2019     Priority: Medium    Restless leg syndrome 12/20/2010     Priority: Medium    Osteoarthritis of multiple joints 12/17/2010     Priority: Medium    Vitamin B12 deficiency 06/02/2020     Priority: Low   Contact dermatitis due to chemicals 01/25/2019     Priority: Low   Generalized hyperhidrosis 07/15/2016     Priority: Low   AR (allergic rhinitis) 12/20/2010     Priority: Low   Status post total replacement of right hip 05/11/2021    Status post total replacement of left hip 02/09/2021    Health Maintenance  Topic Date Due   MAMMOGRAM  03/07/2023   COVID-19 Vaccine (5 - 2023-24 season) 10/01/2023   DEXA SCAN  11/21/2023   Medicare Annual Wellness (AWV)  01/30/2024   Colonoscopy  03/03/2025   DTaP/Tdap/Td (3 - Td or Tdap) 07/21/2028   Pneumonia Vaccine 11+ Years old  Completed   INFLUENZA VACCINE  Completed   Hepatitis C Screening  Completed   Zoster Vaccines- Shingrix  Completed   HPV VACCINES  Aged Out   Immunization History  Administered Date(s) Administered   Fluad  Quad(high Dose 65+) 09/23/2020, 01/21/2023   Fluad Trivalent(High Dose 65+) 08/06/2023   Influenza Inj Mdck Quad Pf 12/15/2014, 01/15/2016   Influenza, High Dose Seasonal PF 07/21/2018   Influenza, Seasonal, Injecte, Preservative Fre 11/19/2007   Influenza,inj,Quad PF,6+ Mos 10/20/2017   Influenza-Unspecified 10/08/2011   Moderna Covid-19 Vaccine Bivalent Booster 57yrs & up 08/06/2023   PFIZER(Purple Top)SARS-COV-2 Vaccination 01/27/2020, 02/17/2020, 12/15/2020   PPD Test 11/19/2007   Pneumococcal Conjugate-13 05/17/2020   Pneumococcal Polysaccharide-23 07/17/2022   Tdap 11/19/2007, 07/21/2018   Zoster Recombinant(Shingrix) 12/19/2020, 02/16/2022   Zoster, Live 07/11/2014   We updated and reviewed the patient's past history in detail and it is documented below. Allergies: Patient is allergic to codeine and thimerosal. Past Medical History Patient  has a past medical history of Anemia, Arthritis, Cancer (HCC), COVID-19 (08/28/2020), Depression, Family  history of adverse reaction to anesthesia, Hyperlipidemia, Hypertension, Hypothyroidism, Lumbar spinal stenosis (07/08/2013), OSA on CPAP (10/26/2018), Pre-diabetes, and Renal mass (09/2020). Past Surgical History Patient  has a past surgical history that includes Knee arthroscopy; Tubal ligation; Gastric bypass; Spine surgery; Uvulectomy; Total knee arthroplasty (Right, 04/02/2019); Tonsillectomy; Total knee arthroplasty (Left, 08/20/2019); Robotic assited partial nephrectomy (Left, 09/22/2020); Ventral hernia repair (N/A, 11/23/2020); Total hip arthroplasty (Left, 02/09/2021); and Total hip arthroplasty (Right, 05/11/2021). Family History: Patient family history includes Arthritis in her brother, mother, and sister; Atrial fibrillation in her mother; Diabetes in her brother; Heart disease in her father; Hyperlipidemia in her brother and mother; Hypertension in her mother; Leukemia in her father. Social History:  Patient  reports that she has never  smoked. She has never used smokeless tobacco. She reports current alcohol use. She reports that she does not use drugs.  Review of Systems: Constitutional: negative for fever or malaise Ophthalmic: negative for photophobia, double vision or loss of vision Cardiovascular: negative for chest pain, dyspnea on exertion, or new LE swelling Respiratory: negative for SOB or persistent cough Gastrointestinal: negative for abdominal pain, change in bowel habits or melena Genitourinary: negative for dysuria or gross hematuria, no abnormal uterine bleeding or disharge Musculoskeletal: negative for new gait disturbance or muscular weakness Integumentary: negative for new or persistent rashes, no breast lumps Neurological: negative for TIA or stroke symptoms Psychiatric: negative for SI or delusions Allergic/Immunologic: negative for hives  Patient Care Team    Relationship Specialty Notifications Start End  Willow Ora, MD PCP - General Family Medicine  10/16/17   Eugenia Mcalpine, MD (Inactive) Consulting Physician Orthopedic Surgery  02/22/20     Objective  Vitals: BP 130/70   Pulse 71   Temp 98.1 F (36.7 C)   Ht 5\' 5"  (1.651 m)   Wt 247 lb 12.8 oz (112.4 kg)   SpO2 97%   BMI 41.24 kg/m  General:  Well developed, well nourished, no acute distress  Psych:  Alert and orientedx3,normal mood and affect, happy HEENT:  Normocephalic, atraumatic, non-icteric sclera,  supple neck without adenopathy, mass or thyromegaly Cardiovascular:  Normal S1, S2, RRR without gallop, rub or murmur Respiratory:  Good breath sounds bilaterally, CTAB with normal respiratory effort Gastrointestinal: normal bowel sounds, soft, non-tender, no noted masses. No HSM MSK: extremities without edema, joints without erythema or swelling Neurologic:    Mental status is normal.  Gross motor and sensory exams are normal.  No tremor  Commons side effects, risks, benefits, and alternatives for medications and treatment plan  prescribed today were discussed, and the patient expressed understanding of the given instructions. Patient is instructed to call or message via MyChart if he/she has any questions or concerns regarding our treatment plan. No barriers to understanding were identified. We discussed Red Flag symptoms and signs in detail. Patient expressed understanding regarding what to do in case of urgent or emergency type symptoms.  Medication list was reconciled, printed and provided to the patient in AVS. Patient instructions and summary information was reviewed with the patient as documented in the AVS. This note was prepared with assistance of Dragon voice recognition software. Occasional wrong-word or sound-a-like substitutions may have occurred due to the inherent limitations of voice recognition software

## 2023-08-29 NOTE — Progress Notes (Signed)
See mychart note Dear Veronica Finley, Your lab results are all stable. No changes are needed at this time. You remain prediabetic. Your renal function is stable. Your cholesterol and thyroid levels are in range.  I have sent in the request for the CPAP machine.  Always good seeing you! Sincerely, Dr. Mardelle Matte

## 2023-09-04 ENCOUNTER — Telehealth: Payer: Self-pay | Admitting: Family Medicine

## 2023-09-04 NOTE — Telephone Encounter (Signed)
Cpap forms has been faxed

## 2023-09-04 NOTE — Telephone Encounter (Signed)
Patient called in regards to CPAP machine paperwork from [place]. States [facility] informed her they sent [place] needed paperwork in order for CPAP to be replaced. Can we confirm?

## 2023-09-05 ENCOUNTER — Other Ambulatory Visit: Payer: Self-pay | Admitting: Family Medicine

## 2023-09-05 DIAGNOSIS — Z1231 Encounter for screening mammogram for malignant neoplasm of breast: Secondary | ICD-10-CM

## 2023-09-10 DIAGNOSIS — I10 Essential (primary) hypertension: Secondary | ICD-10-CM | POA: Diagnosis not present

## 2023-09-10 DIAGNOSIS — G4733 Obstructive sleep apnea (adult) (pediatric): Secondary | ICD-10-CM | POA: Diagnosis not present

## 2023-10-01 ENCOUNTER — Ambulatory Visit
Admission: RE | Admit: 2023-10-01 | Discharge: 2023-10-01 | Disposition: A | Discharge status: Home / Self Care | Payer: Medicare PPO | Source: Ambulatory Visit | Attending: Family Medicine | Admitting: Family Medicine

## 2023-10-01 DIAGNOSIS — Z1231 Encounter for screening mammogram for malignant neoplasm of breast: Secondary | ICD-10-CM

## 2023-10-06 ENCOUNTER — Encounter: Payer: Self-pay | Admitting: Family Medicine

## 2023-10-07 NOTE — Telephone Encounter (Signed)
Spoke with pt and I told her that I have faxed the order to [facility] and they should be reaching out to her to set up an appt.

## 2023-10-11 DIAGNOSIS — I10 Essential (primary) hypertension: Secondary | ICD-10-CM | POA: Diagnosis not present

## 2023-10-11 DIAGNOSIS — G4733 Obstructive sleep apnea (adult) (pediatric): Secondary | ICD-10-CM | POA: Diagnosis not present

## 2023-10-23 ENCOUNTER — Ambulatory Visit: Payer: Medicare PPO | Admitting: Family Medicine

## 2023-10-23 ENCOUNTER — Encounter: Payer: Self-pay | Admitting: Family Medicine

## 2023-10-23 VITALS — BP 124/74 | HR 85 | Temp 97.7°F | Ht 65.0 in | Wt 250.2 lb

## 2023-10-23 DIAGNOSIS — L821 Other seborrheic keratosis: Secondary | ICD-10-CM | POA: Diagnosis not present

## 2023-10-23 DIAGNOSIS — I10 Essential (primary) hypertension: Secondary | ICD-10-CM

## 2023-10-23 DIAGNOSIS — G4733 Obstructive sleep apnea (adult) (pediatric): Secondary | ICD-10-CM | POA: Diagnosis not present

## 2023-10-23 MED ORDER — ZEPBOUND 2.5 MG/0.5ML ~~LOC~~ SOAJ: 2.5000 mg | SUBCUTANEOUS | 2 refills | Status: DC

## 2023-10-23 NOTE — Patient Instructions (Addendum)
Please follow up if symptoms do not improve or as needed.    VISIT SUMMARY:  You had a follow-up visit today to discuss your obstructive sleep apnea, a skin spot, and your interest in weight loss. You reported consistent use of your CPAP machine and no issues with it. You also expressed concern about a rough, darkened spot on your skin and inquired about weight loss medications, specifically Ozempic.  YOUR PLAN:  -OBESITY: Obesity is a condition characterized by excessive body fat. We discussed your interest in weight loss medications like Zepbound and Wegovy, which can help manage weight and improve related conditions like prediabetes and hypertension. You should contact your insurance company to check coverage and prior authorization requirements for these medications  -OBSTRUCTIVE SLEEP APNEA: Obstructive sleep apnea is a condition where your breathing stops and starts during sleep. Your condition is well-controlled with nightly use of your CPAP machine, and you are feeling well overall. Please contact [employer] for any required forms or compliance data.  -SEBORRHEIC KERATOSIS: Seborrheic keratosis is a common, non-cancerous skin growth. The spot on your skin is benign and does not require treatment unless it changes or becomes bothersome. If it does, we can consider cryotherapy to remove it.  INSTRUCTIONS:  Please contact your insurance company to check coverage and prior authorization requirements for weight loss medications like Ozempic, Wegovy, Mounjaro, and Zepbound. Also, reach out to [employer] for any required forms or compliance data related to your CPAP machine. Monitor the skin spot for any changes and let us know if it becomes bothersome.

## 2023-10-23 NOTE — Progress Notes (Signed)
She  Subjective  CC:  Chief Complaint  Patient presents with   Obstructive Sleep Apnea    HPI: Veronica Finley is a 72 y.o. female who presents to the office today to address the problems listed above in the chief complaint. Discussed the use of AI scribe software for clinical note transcription with the patient, who gave verbal consent to proceed.  History of Present Illness   The patient, with a history of obstructive sleep apnea, presents for a follow-up visit after receiving a new CPAP machine. She reports consistent use of the machine every night, as she experiences difficulty breathing without it. The patient denies any issues with the machine and reports feeling well overall. She feels rested upon waking, watch shows good sleep.   In addition to the sleep apnea, the patient has been concerned about a spot on her skin. She describes it as a rough spot that has darkened over time, possibly due to scratching. The patient denies any pain or discomfort associated with the spot, but expresses worry about its appearance and potential significance.  Lastly, the patient expresses interest in weight loss and inquires about the medication Ozempic. She reports struggling with weight management despite efforts to decrease caloric intake and increase physical activity. The patient has not previously participated in any weight loss programs or taken any weight loss medications. She also has a history of high blood pressure. She has however tried multiple diets over the years without success. She walks a lot for exercise.      Assessment  1. OSA on CPAP   2. Morbid obesity (HCC)   3. Essential hypertension   4. Seborrheic keratosis      Plan  Assessment and Plan    Obesity Interested in weight loss and inquired about GLP-1 receptor agonists (Ozempic, Ke[place]e). Prediabetic with hypertension. Discussed potential benefits, side effects, and insurance coverage requirements, including prior  authorization or trial of other medications. - Document interest in weight loss medications and prediabetic status. - Advise to contact insurance company regarding coverage for zepbound or wegovy. She has prediabetes and htn which both would improve with weight loss. - advise to ask about coverage and prior authorization requirements. -Educated about use, possible side effects, mechanisms of actions  Obstructive Sleep Apnea Well-controlled with nightly CPAP use. No issues with sleepiness. Compliance data managed by pulmonologist. Advised to contact [employer] for required forms or compliance data. - Document nightly CPAP use and well-being. - Advise to contact [employer] for required forms or compliance data.  Seborrheic Keratosis Small, benign lesion with adjacent red spot likely from scratching. Not precancerous. No treatment required unless it changes or becomes bothersome. - Monitor lesion for changes. - Consider cryotherapy if lesion becomes bothersome or changes.   Blood pressure remains controlled    Follow up: As scheduled 02/03/2024  No orders of the defined types were placed in this encounter.  No orders of the defined types were placed in this encounter.     I reviewed the patients updated PMH, FH, and SocHx.    Patient Active Problem List   Diagnosis Date Noted   Prediabetes 01/21/2023    Priority: High   Morbid obesity (HCC) 07/17/2022    Priority: High   Stage 3a chronic kidney disease (HCC) 06/28/2021    Priority: High   History of renal cell carcinoma 12/14/2020    Priority: High   Status post nephrectomy 10/24/2020    Priority: High   OSA on CPAP 10/26/2018  Priority: High   Hx of laparoscopic gastric banding+ truncal vagotomy 03/24/2006 01/10/2014    Priority: High   Spinal stenosis of lumbar region 07/08/2013    Priority: High   Major depression, recurrent, chronic (HCC) 07/08/2013    Priority: High   Essential hypertension 04/06/2012    Priority: High    Acquired hypothyroidism 10/08/2011    Priority: High   Mixed hyperlipidemia 10/08/2011    Priority: High   S/P knee replacement 04/02/2019    Priority: Medium    Restless leg syndrome 12/20/2010    Priority: Medium    Osteoarthritis of multiple joints 12/17/2010    Priority: Medium    Vitamin B12 deficiency 06/02/2020    Priority: Low   Contact dermatitis due to chemicals 01/25/2019    Priority: Low   Generalized hyperhidrosis 07/15/2016    Priority: Low   AR (allergic rhinitis) 12/20/2010    Priority: Low   Status post total replacement of right hip 05/11/2021   Status post total replacement of left hip 02/09/2021   Current Meds  Medication Sig   acetaminophen (TYLENOL) 500 MG tablet Take 1,000 mg by mouth every 6 (six) hours as needed for moderate pain.   cholecalciferol (VITAMIN D3) 25 MCG (1000 UNIT) tablet Take 1,000 Units by mouth daily.   diltiazem (CARDIZEM CD) 240 MG 24 hr capsule TAKE 1 CAPSULE EVERY DAY   FLUoxetine (PROZAC) 20 MG capsule TAKE 1 CAPSULE EVERY DAY (NEED MD APPOINTMENT)   levothyroxine (SYNTHROID) 150 MCG tablet Take 1 tablet (150 mcg total) by mouth daily.   losartan-hydrochlorothiazide (HYZAAR) 100-25 MG tablet TAKE 1 TABLET EVERY DAY (NEED MD APPOINTMENT)   rOPINIRole (REQUIP XL) 4 MG 24 hr tablet TAKE 1 TABLET AT BEDTIME   rosuvastatin (CRESTOR) 10 MG tablet TAKE 1 TABLET AT BEDTIME   vitamin B-12 (CYANOCOBALAMIN) 1000 MCG tablet Take 1,000 mcg by mouth daily.   vitamin C (ASCORBIC ACID) 500 MG tablet Take 500 mg by mouth 3 (three) times a week.    Allergies: Patient is allergic to thimerosal and thimerosal (thiomersal). Family History: Patient family history includes Arthritis in her brother, mother, and sister; Atrial fibrillation in her mother; Diabetes in her brother; Heart disease in her father; Hyperlipidemia in her brother and mother; Hypertension in her mother; Leukemia in her father. Social History:  Patient  reports that she has never  smoked. She has never used smokeless tobacco. She reports current alcohol use. She reports that she does not use drugs.  Review of Systems: Constitutional: Negative for fever malaise or anorexia Cardiovascular: negative for chest pain Respiratory: negative for SOB or persistent cough Gastrointestinal: negative for abdominal pain  Objective  Vitals: BP 124/74 Comment: left arm seated  Pulse 85   Temp 97.7 F (36.5 C)   Ht 5\' 5"  (1.651 m)   Wt 250 lb 3.2 oz (113.5 kg)   SpO2 94%   BMI 41.64 kg/m  General: no acute distress , A&Ox3 HEENT: PEERL, conjunctiva normal, neck is supple Cardiovascular:  RRR without murmur or gallop.  Respiratory:  Good breath sounds bilaterally, CTAB with normal respiratory effort Skin: Right lower cheek with small light brown raised flaky oval lesion with red mole just beneath it.  Abrasion noted.  Commons side effects, risks, benefits, and alternatives for medications and treatment plan prescribed today were discussed, and the patient expressed understanding of the given instructions. Patient is instructed to call or message via MyChart if he/she has any questions or concerns regarding our treatment plan. No  barriers to understanding were identified. We discussed Red Flag symptoms and signs in detail. Patient expressed understanding regarding what to do in case of urgent or emergency type symptoms.  Medication list was reconciled, printed and provided to the patient in AVS. Patient instructions and summary information was reviewed with the patient as documented in the AVS. This note was prepared with assistance of Dragon voice recognition software. Occasional wrong-word or sound-a-like substitutions may have occurred due to the inherent limitations of voice recognition software

## 2023-11-10 DIAGNOSIS — I10 Essential (primary) hypertension: Secondary | ICD-10-CM | POA: Diagnosis not present

## 2023-11-10 DIAGNOSIS — G4733 Obstructive sleep apnea (adult) (pediatric): Secondary | ICD-10-CM | POA: Diagnosis not present

## 2023-11-19 ENCOUNTER — Other Ambulatory Visit: Payer: Self-pay | Admitting: Family Medicine

## 2023-11-20 ENCOUNTER — Encounter: Payer: Self-pay | Admitting: Family Medicine

## 2023-11-21 ENCOUNTER — Encounter: Payer: Self-pay | Admitting: Family Medicine

## 2023-11-21 MED ORDER — WEGOVY 0.25 MG/0.5ML ~~LOC~~ SOAJ: 0.2500 mg | SUBCUTANEOUS | 0 refills | Status: DC

## 2023-11-24 ENCOUNTER — Telehealth: Payer: Self-pay

## 2023-11-24 DIAGNOSIS — J0100 Acute maxillary sinusitis, unspecified: Secondary | ICD-10-CM | POA: Diagnosis not present

## 2023-11-24 DIAGNOSIS — Z6838 Body mass index (BMI) 38.0-38.9, adult: Secondary | ICD-10-CM | POA: Diagnosis not present

## 2023-11-24 NOTE — Telephone Encounter (Signed)
 Pharmacy Patient Advocate Encounter  Received notification from [employer] that Prior Authorization for Wegovy  0.25 mg has been DENIED.  See denial reason below. No denial letter attached in CMM. Will attach denial letter to Media tab once received.   PA #/Case ID/Reference #: [id number]    Denial Reason: Patient didn't meet criteria. The criteria is the patient has to have an existing cardiac disease which is not noted in the chart. If she does have an existing cardiac disease we will need documentation of that to send to [facility]. An appeal might be possible.

## 2023-11-27 DIAGNOSIS — Z780 Asymptomatic menopausal state: Secondary | ICD-10-CM | POA: Diagnosis not present

## 2023-11-27 LAB — HM DEXA SCAN: HM Dexa Scan: NORMAL

## 2023-11-28 ENCOUNTER — Encounter: Payer: Self-pay | Admitting: Family Medicine

## 2023-12-11 DIAGNOSIS — G4733 Obstructive sleep apnea (adult) (pediatric): Secondary | ICD-10-CM | POA: Diagnosis not present

## 2023-12-11 DIAGNOSIS — I10 Essential (primary) hypertension: Secondary | ICD-10-CM | POA: Diagnosis not present

## 2023-12-16 ENCOUNTER — Other Ambulatory Visit (HOSPITAL_COMMUNITY): Payer: Self-pay

## 2023-12-17 DIAGNOSIS — Z961 Presence of intraocular lens: Secondary | ICD-10-CM | POA: Diagnosis not present

## 2023-12-17 DIAGNOSIS — H40012 Open angle with borderline findings, low risk, left eye: Secondary | ICD-10-CM | POA: Diagnosis not present

## 2023-12-17 DIAGNOSIS — H43813 Vitreous degeneration, bilateral: Secondary | ICD-10-CM | POA: Diagnosis not present

## 2023-12-17 DIAGNOSIS — H04123 Dry eye syndrome of bilateral lacrimal glands: Secondary | ICD-10-CM | POA: Diagnosis not present

## 2023-12-17 DIAGNOSIS — H1045 Other chronic allergic conjunctivitis: Secondary | ICD-10-CM | POA: Diagnosis not present

## 2024-01-08 ENCOUNTER — Encounter: Payer: Self-pay | Admitting: Family Medicine

## 2024-01-08 DIAGNOSIS — Z13820 Encounter for screening for osteoporosis: Secondary | ICD-10-CM | POA: Insufficient documentation

## 2024-01-08 NOTE — Progress Notes (Signed)
 See mychart note.   Veronica Finley, Thank you for getting your bone density screening test done. I have reviewed the results. Your bone density is normal. This is good news. We can consider rechecking in 5 years.   Sincerely, Dr. Mardelle Matte

## 2024-01-11 DIAGNOSIS — I10 Essential (primary) hypertension: Secondary | ICD-10-CM | POA: Diagnosis not present

## 2024-01-11 DIAGNOSIS — G4733 Obstructive sleep apnea (adult) (pediatric): Secondary | ICD-10-CM | POA: Diagnosis not present

## 2024-02-03 ENCOUNTER — Ambulatory Visit (INDEPENDENT_AMBULATORY_CARE_PROVIDER_SITE_OTHER): Payer: Medicare PPO

## 2024-02-03 VITALS — Ht 66.0 in | Wt 240.0 lb

## 2024-02-03 DIAGNOSIS — Z0000 Encounter for general adult medical examination without abnormal findings: Secondary | ICD-10-CM

## 2024-02-03 NOTE — Patient Instructions (Signed)
 Veronica Finley , Thank you for taking time to come for your Medicare Wellness Visit. I appreciate your ongoing commitment to your health goals. Please review the following plan we discussed and let me know if I can assist you in the future.   Referrals/Orders/Follow-Ups/Clinician Recommendations: Continue to exercise daily and work on losing weight    This is a list of the screening recommended for you and due dates:  Health Maintenance  Topic Date Due   COVID-19 Vaccine (5 - 2024-25 season) 10/01/2023   Mammogram  09/30/2024   Medicare Annual Wellness Visit  02/02/2025   Colon Cancer Screening  03/03/2025   DTaP/Tdap/Td vaccine (3 - Td or Tdap) 07/21/2028   DEXA scan (bone density measurement)  11/26/2028   Pneumonia Vaccine  Completed   Flu Shot  Completed   Hepatitis C Screening  Completed   Zoster (Shingles) Vaccine  Completed   HPV Vaccine  Aged Out    Advanced directives: (Provided) Advance directive discussed with you today. I have provided a copy for you to complete at home and have notarized. Once this is complete, please bring a copy in to our office so we can scan it into your chart.   Next Medicare Annual Wellness Visit scheduled for next year: Yes

## 2024-02-03 NOTE — Progress Notes (Signed)
 Subjective:   Veronica Finley is a 73 y.o. who presents for a Medicare Wellness preventive visit.  Visit Complete: Virtual I connected with  [name] on 02/03/24 by a audio enabled telemedicine application and verified that I am speaking with the correct person using two identifiers.  Patient Location: Home  Provider Location: Office/Clinic  I discussed the limitations of evaluation and management by telemedicine. The patient expressed understanding and agreed to proceed.  Vital Signs: Because this visit was a virtual/telehealth visit, some criteria may be missing or patient reported. Any vitals not documented were not able to be obtained and vitals that have been documented are patient reported.  VideoDeclined- This patient declined Librarian, academic. Therefore the visit was completed with audio only.  Persons Participating in Visit: Patient.  AWV Questionnaire: No: Patient Medicare AWV questionnaire was not completed prior to this visit.  Cardiac Risk Factors include: advanced age (>10men, >78 women);dyslipidemia;hypertension;obesity (BMI >30kg/m2)     Objective:    Today's Vitals   02/03/24 1004  Weight: 240 lb (108.9 kg)  Height: 5\' 6"  (1.676 m)   Body mass index is 38.74 kg/m.     02/03/2024   10:09 AM 01/30/2023   11:43 AM 01/17/2022   11:45 AM 05/11/2021   11:47 AM 05/07/2021   10:48 AM 02/09/2021    3:00 PM 01/29/2021    9:10 AM  Advanced Directives  Does Patient Have a Medical Advance Directive? No No No No No No No  Would patient like information on creating a medical advance directive? No - Patient declined No - Patient declined Yes (MAU/Ambulatory/Procedural Areas - Information given) No - Patient declined  No - Patient declined No - Patient declined    Current Medications (verified) Outpatient Encounter Medications as of 02/03/2024  Medication Sig   acetaminophen (TYLENOL) 500 MG tablet Take 1,000 mg by mouth every 6  (six) hours as needed for moderate pain.   cholecalciferol (VITAMIN D3) 25 MCG (1000 UNIT) tablet Take 1,000 Units by mouth daily.   diltiazem (CARDIZEM CD) 240 MG 24 hr capsule TAKE 1 CAPSULE EVERY DAY   FLUoxetine (PROZAC) 20 MG capsule TAKE 1 CAPSULE EVERY DAY (NEED MD APPOINTMENT)   levothyroxine (SYNTHROID) 150 MCG tablet Take 1 tablet (150 mcg total) by mouth daily.   losartan-hydrochlorothiazide (HYZAAR) 100-25 MG tablet TAKE 1 TABLET EVERY DAY (NEED MD APPOINTMENT)   rOPINIRole (REQUIP XL) 4 MG 24 hr tablet TAKE 1 TABLET AT BEDTIME   rosuvastatin (CRESTOR) 10 MG tablet TAKE 1 TABLET AT BEDTIME   vitamin B-12 (CYANOCOBALAMIN) 1000 MCG tablet Take 1,000 mcg by mouth daily.   vitamin C (ASCORBIC ACID) 500 MG tablet Take 500 mg by mouth 3 (three) times a week.   [DISCONTINUED] WEGOVY 0.25 MG/0.5ML SOAJ Inject 0.25 mg into the skin once a week.   No facility-administered encounter medications on file as of 02/03/2024.    Allergies (verified) Thimerosal and Thimerosal (thiomersal)   History: Past Medical History:  Diagnosis Date   Anemia    Arthritis    Cancer (HCC)    COVID-19 08/28/2020   Depression    Family history of adverse reaction to anesthesia    mother and daughter have severe PONV   Hyperlipidemia    Hypertension    Hypothyroidism    Lumbar spinal stenosis 07/08/2013   OSA on CPAP 10/26/2018   Pre-diabetes    Renal mass 09/2020   Past Surgical History:  Procedure Laterality Date   GASTRIC  BYPASS     KNEE ARTHROSCOPY     ROBOTIC ASSITED PARTIAL NEPHRECTOMY Left 09/22/2020   Procedure: XI ROBOTIC ASSITED LAPAROSCOPIC  TOTAL NEPHRECTOMY; RESECTION OF ABDOMINAL MASS;  Surgeon: Rene Paci, MD;  Location: [facility];  Service: Urology;  Laterality: Left;   SPINE SURGERY     TONSILLECTOMY     TOTAL HIP ARTHROPLASTY Left 02/09/2021   Procedure: LEFT TOTAL HIP ARTHROPLASTY ANTERIOR APPROACH;  Surgeon: Kathryne Hitch, MD;  Location: [facility];  Service:  Orthopedics;  Laterality: Left;   TOTAL HIP ARTHROPLASTY Right 05/11/2021   Procedure: RIGHT TOTAL HIP ARTHROPLASTY ANTERIOR APPROACH;  Surgeon: Kathryne Hitch, MD;  Location: [facility];  Service: Orthopedics;  Laterality: Right;   TOTAL KNEE ARTHROPLASTY Right 04/02/2019   Procedure: TOTAL KNEE ARTHROPLASTY;  Surgeon: Eugenia Mcalpine, MD;  Location: [facility];  Service: Orthopedics;  Laterality: Right;  spinal plus adductor canal   TOTAL KNEE ARTHROPLASTY Left 08/20/2019   Procedure: TOTAL KNEE ARTHROPLASTY;  Surgeon: Eugenia Mcalpine, MD;  Location: [facility];  Service: Orthopedics;  Laterality: Left;  with adductor canal   TUBAL LIGATION     UVULECTOMY     VENTRAL HERNIA REPAIR N/A 11/23/2020   Procedure: VENTRAL HERNIA REPAIR WITH MESH;  Surgeon: Abigail Miyamoto, MD;  Location: [facility];  Service: General;  Laterality: N/A;  LMA   Family History  Problem Relation Age of Onset   Arthritis Mother    Atrial fibrillation Mother    Hyperlipidemia Mother    Hypertension Mother    Leukemia Father    Heart disease Father    Arthritis Sister    Arthritis Brother    Diabetes Brother    Hyperlipidemia Brother    Allergic rhinitis Neg Hx    Angioedema Neg Hx    Asthma Neg Hx    Eczema Neg Hx    Immunodeficiency Neg Hx    Urticaria Neg Hx    Social History   Socioeconomic History   Marital status: Single    Spouse name: Not on file   Number of children: Not on file   Years of education: Not on file   Highest education level: Not on file  Occupational History   Not on file  Tobacco Use   Smoking status: Never   Smokeless tobacco: Never  Vaping Use   Vaping status: Never Used  Substance and Sexual Activity   Alcohol use: Yes    Comment: rarely   Drug use: No   Sexual activity: Never    Birth control/protection: Post-menopausal  Other Topics Concern   Not on file  Social History Narrative   Not on file   Social Drivers of Health   Financial Resource Strain:  Low Risk  (02/03/2024)   Overall Financial Resource Strain (CARDIA)    Difficulty of Paying Living Expenses: Not hard at all  Food Insecurity: No Food Insecurity (02/03/2024)   Hunger Vital Sign    Worried About Running Out of Food in the Last Year: Never true    Ran Out of Food in the Last Year: Never true  Transportation Needs: No Transportation Needs (02/03/2024)   PRAPARE - Administrator, Civil Service (Medical): No    Lack of Transportation (Non-Medical): No  Physical Activity: Sufficiently Active (02/03/2024)   Exercise Vital Sign    Days of Exercise per Week: 5 days    Minutes of Exercise per Session: 90 min  Stress: No Stress Concern Present (02/03/2024)   [place]  [employer] - Occupational Stress Questionnaire    Feeling of Stress : Not at all  Social Connections: Moderately Isolated (02/03/2024)   Social Connection and Isolation Panel [NHANES]    Frequency of Communication with Friends and Family: More than three times a week    Frequency of Social Gatherings with Friends and Family: More than three times a week    Attends Religious Services: Never    Database administrator or Organizations: Yes    Attends Engineer, structural: 1 to 4 times per year    Marital Status: Never married    Tobacco Counseling Counseling given: Not Answered    Clinical Intake:  Pre-visit preparation completed: Yes  Pain : No/denies pain     BMI - recorded: 38.74 Nutritional Status: BMI > 30  Obese Diabetes: No  How often do you need to have someone help you when you read instructions, pamphlets, or other written materials from your doctor or pharmacy?: 1 - Never  Interpreter Needed?: No  Information entered by :: Lanier Ensign, LPN   Activities of Daily Living     02/03/2024   10:05 AM  In your present state of health, do you have any difficulty performing the following activities:  Hearing? 0  Vision? 0  Difficulty concentrating or  making decisions? 0  Walking or climbing stairs? 0  Dressing or bathing? 0  Doing errands, shopping? 0  Preparing Food and eating ? N  Using the Toilet? N  In the past six months, have you accidently leaked urine? N  Do you have problems with loss of bowel control? N  Managing your Medications? N  Managing your Finances? N  Housekeeping or managing your Housekeeping? N    Patient Care Team: Willow Ora, MD as PCP - General (Family Medicine) Eugenia Mcalpine, MD (Inactive) as Consulting Physician (Orthopedic Surgery)  Indicate any recent Medical Services you may have received from other than [facility] providers in the past year (date may be approximate).     Assessment:   This is a routine wellness examination for Veronica Finley.  Hearing/Vision screen Hearing Screening - Comments:: Pt denies any hearing issues  Vision Screening - Comments:: Pt follows up with Dr Dione Booze for annual eye exams    Goals Addressed             This Visit's Progress    Patient Stated       Lose weight        Depression Screen     02/03/2024   10:07 AM 08/27/2023    9:26 AM 01/30/2023   11:41 AM 01/21/2023    9:13 AM 01/17/2022   11:43 AM 06/28/2021   10:57 AM 12/14/2020   10:28 AM  PHQ 2/9 Scores  PHQ - 2 Score 0 0 0 0 0 0 0  PHQ- 9 Score      0 0    Fall Risk     02/03/2024   10:09 AM 08/27/2023    9:26 AM 01/26/2023    9:46 AM 01/21/2023    9:13 AM 01/17/2022   11:47 AM  Fall Risk   Falls in the past year? 0 0 0 0 1  Number falls in past yr: 0 0 0 0 1  Injury with Fall? 0 0 0 0 0  Comment     fell out of rolling chair  Risk for fall due to : No Fall Risks No Fall Risks Impaired vision No Fall Risks Impaired vision  Follow up Falls prevention discussed Falls evaluation completed Falls prevention discussed  Falls prevention discussed    MEDICARE RISK AT HOME:  Medicare Risk at Home Any stairs in or around the home?: No If so, are there any without handrails?: No Home free of loose throw rugs  in walkways, pet beds, electrical cords, etc?: Yes Adequate lighting in your home to reduce risk of falls?: Yes Life alert?: No Use of a cane, walker or w/c?: No Grab bars in the bathroom?: Yes Shower chair or bench in shower?: Yes Elevated toilet seat or a handicapped toilet?: No  TIMED UP AND GO:  Was the test performed?  No  Cognitive Function: 6CIT completed        02/03/2024   10:10 AM 01/30/2023   11:44 AM 01/17/2022   11:49 AM 02/22/2020   11:10 AM  6CIT Screen  What Year? 0 points 0 points 0 points 0 points  What month? 0 points 0 points 0 points 0 points  What time? 0 points 0 points 0 points 0 points  Count back from 20 0 points 0 points 0 points 0 points  Months in reverse 0 points 0 points 0 points 0 points  Repeat phrase 0 points 0 points 0 points 0 points  Total Score 0 points 0 points 0 points 0 points    Immunizations Immunization History  Administered Date(s) Administered   Fluad Quad(high Dose 65+) 09/23/2020, 01/21/2023   Fluad Trivalent(High Dose 65+) 08/06/2023   Influenza Inj Mdck Quad Pf 12/15/2014, 01/15/2016   Influenza, High Dose Seasonal PF 07/21/2018   Influenza, Seasonal, Injecte, Preservative Fre 11/19/2007   Influenza,inj,Quad PF,6+ Mos 10/20/2017   Influenza-Unspecified 10/08/2011   Moderna Covid-19 Vaccine Bivalent Booster 33yrs & up 08/06/2023   PFIZER(Purple Top)SARS-COV-2 Vaccination 01/27/2020, 02/17/2020, 12/15/2020   PPD Test 11/19/2007   Pneumococcal Conjugate-13 05/17/2020   Pneumococcal Polysaccharide-23 07/17/2022   Tdap 11/19/2007, 07/21/2018   Zoster Recombinant(Shingrix) 12/19/2020, 02/16/2022   Zoster, Live 07/11/2014    Screening Tests Health Maintenance  Topic Date Due   COVID-19 Vaccine (5 - 2024-25 season) 10/01/2023   MAMMOGRAM  09/30/2024   Medicare Annual Wellness (AWV)  02/02/2025   Colonoscopy  03/03/2025   DTaP/Tdap/Td (3 - Td or Tdap) 07/21/2028   DEXA SCAN  11/26/2028   Pneumonia Vaccine 32+ Years old   Completed   INFLUENZA VACCINE  Completed   Hepatitis C Screening  Completed   Zoster Vaccines- Shingrix  Completed   HPV VACCINES  Aged Out    Health Maintenance  Health Maintenance Due  Topic Date Due   COVID-19 Vaccine (5 - 2024-25 season) 10/01/2023   Health Maintenance Items Addressed: See Nurse Notes  Additional Screening:  Vision Screening: Recommended annual ophthalmology exams for early detection of glaucoma and other disorders of the eye.  Dental Screening: Recommended annual dental exams for proper oral hygiene  Community Resource Referral / Chronic Care Management: CRR required this visit?  No   CCM required this visit?  No     Plan:     I have personally reviewed and noted the following in the patient's chart:   Medical and social history Use of alcohol, tobacco or illicit drugs  Current medications and supplements including opioid prescriptions. Patient is not currently taking opioid prescriptions. Functional ability and status Nutritional status Physical activity Advanced directives List of other physicians Hospitalizations, surgeries, and ER visits in previous 12 months Vitals Screenings to include cognitive, depression, and falls Referrals and appointments  In addition, I  have reviewed and discussed with patient certain preventive protocols, quality metrics, and best practice recommendations. A written personalized care plan for preventive services as well as general preventive health recommendations were provided to patient.     Marzella Schlein, LPN   07/16/5620   After Visit Summary: (MyChart) Due to this being a telephonic visit, the after visit summary with patients personalized plan was offered to patient via MyChart   Notes: Nothing significant to report at this time.

## 2024-02-04 ENCOUNTER — Other Ambulatory Visit (HOSPITAL_COMMUNITY): Payer: Self-pay | Admitting: Urology

## 2024-02-04 ENCOUNTER — Ambulatory Visit (HOSPITAL_COMMUNITY)
Admission: RE | Admit: 2024-02-04 | Discharge: 2024-02-04 | Disposition: A | Discharge status: Home / Self Care | Source: Ambulatory Visit | Attending: Urology | Admitting: Urology

## 2024-02-04 DIAGNOSIS — C642 Malignant neoplasm of left kidney, except renal pelvis: Secondary | ICD-10-CM | POA: Insufficient documentation

## 2024-02-04 DIAGNOSIS — I10 Essential (primary) hypertension: Secondary | ICD-10-CM | POA: Diagnosis not present

## 2024-02-06 DIAGNOSIS — G4733 Obstructive sleep apnea (adult) (pediatric): Secondary | ICD-10-CM | POA: Diagnosis not present

## 2024-02-08 DIAGNOSIS — G4733 Obstructive sleep apnea (adult) (pediatric): Secondary | ICD-10-CM | POA: Diagnosis not present

## 2024-02-08 DIAGNOSIS — I10 Essential (primary) hypertension: Secondary | ICD-10-CM | POA: Diagnosis not present

## 2024-02-09 DIAGNOSIS — K8020 Calculus of gallbladder without cholecystitis without obstruction: Secondary | ICD-10-CM | POA: Diagnosis not present

## 2024-02-09 DIAGNOSIS — N281 Cyst of kidney, acquired: Secondary | ICD-10-CM | POA: Diagnosis not present

## 2024-02-09 DIAGNOSIS — C642 Malignant neoplasm of left kidney, except renal pelvis: Secondary | ICD-10-CM | POA: Diagnosis not present

## 2024-02-09 DIAGNOSIS — Z905 Acquired absence of kidney: Secondary | ICD-10-CM | POA: Diagnosis not present

## 2024-02-26 DIAGNOSIS — C642 Malignant neoplasm of left kidney, except renal pelvis: Secondary | ICD-10-CM | POA: Diagnosis not present

## 2024-03-10 DIAGNOSIS — G4733 Obstructive sleep apnea (adult) (pediatric): Secondary | ICD-10-CM | POA: Diagnosis not present

## 2024-03-10 DIAGNOSIS — I10 Essential (primary) hypertension: Secondary | ICD-10-CM | POA: Diagnosis not present

## 2024-03-29 ENCOUNTER — Ambulatory Visit: Admitting: Family Medicine

## 2024-03-29 ENCOUNTER — Other Ambulatory Visit (HOSPITAL_COMMUNITY): Payer: Self-pay

## 2024-03-29 ENCOUNTER — Telehealth: Payer: Self-pay

## 2024-03-29 ENCOUNTER — Encounter: Payer: Self-pay | Admitting: Family Medicine

## 2024-03-29 VITALS — BP 108/66 | HR 65 | Temp 97.7°F | Ht 66.0 in | Wt 240.2 lb

## 2024-03-29 DIAGNOSIS — G4733 Obstructive sleep apnea (adult) (pediatric): Secondary | ICD-10-CM

## 2024-03-29 DIAGNOSIS — M7062 Trochanteric bursitis, left hip: Secondary | ICD-10-CM | POA: Diagnosis not present

## 2024-03-29 DIAGNOSIS — M5416 Radiculopathy, lumbar region: Secondary | ICD-10-CM | POA: Diagnosis not present

## 2024-03-29 DIAGNOSIS — N1832 Chronic kidney disease, stage 3b: Secondary | ICD-10-CM

## 2024-03-29 DIAGNOSIS — C642 Malignant neoplasm of left kidney, except renal pelvis: Secondary | ICD-10-CM

## 2024-03-29 MED ORDER — PREDNISONE 10 MG PO TABS: ORAL_TABLET | ORAL | 0 refills | Status: DC

## 2024-03-29 MED ORDER — ZEPBOUND 2.5 MG/0.5ML ~~LOC~~ SOAJ: 2.5000 mg | SUBCUTANEOUS | 2 refills | Status: DC

## 2024-03-29 NOTE — Patient Instructions (Signed)
 Please follow up as scheduled for your next visit with me: 08/27/2024   If you have any questions or concerns, please don't hesitate to send me a message via MyChart or call the office at [phone]. Thank you for visiting with us  today! It's our pleasure caring for you.    VISIT SUMMARY: During your visit, we discussed your worsening low back pain that radiates to your left leg, causing you to limp and feel as though your leg might give way. We reviewed your history of arthritis, lumbar stenosis, and recent hip replacement. We also discussed your chronic kidney disease management.  YOUR PLAN: -CHRONIC LOW BACK PAIN WITH RADICULOPATHY: This condition involves pain that starts in the lower back and radiates down the leg due to nerve irritation or compression. We will consider referring you to an orthopedic specialist for further evaluation and management. Additionally, we may prescribe prednisone  to help reduce inflammation and manage your pain.  -LUMBAR SPINAL STENOSIS: This condition is a narrowing of the spaces within your spine, which can put pressure on the nerves traveling through the spine. It is contributing to your chronic low back pain and recent exacerbation. We will continue to monitor and manage this condition.  -OSTEOARTHRITIS: This is a type of arthritis that occurs when flexible tissue at the ends of bones wears down. It is contributing to your chronic pain and mobility issues, and it is being exacerbated by your spinal stenosis. We will continue to manage this condition as part of your overall treatment plan.  -CHRONIC KIDNEY DISEASE: This condition involves a gradual loss of kidney function over time. Your creatinine levels are not optimal, and you have a follow-up appointment with nephrology on May 20 with Dr. Higinio Love to manage this condition.  INSTRUCTIONS: Please follow up with nephrology on May 20 with Dr. Higinio Love for your chronic kidney disease management. We will also  consider an orthopedic referral and possibly prescribing prednisone  for your low back pain. Continue to monitor your symptoms and report any changes.                      Contains text generated by Abridge.                                 Contains text generated by Abridge.

## 2024-03-29 NOTE — Telephone Encounter (Signed)
 Copied from CRM [id number]. Topic: Clinical - Medication Prior Auth >> Mar 29, 2024 12:50 PM [place] J wrote: Reason for CRM: Patient is calling in because Dr. Jonelle Neri prescribed her tirzepatide  (ZEPBOUND ) 2.5 MG/0.5ML Pen today and [employer] faxed over a prior authorization request today. The reference number is: [id number]  Can we run a PA on this Rx

## 2024-03-29 NOTE — Progress Notes (Signed)
 Subjective  CC:  Chief Complaint  Patient presents with   Back Pain    Pain going down lt leg. Hip replacement with some weird feeling     HPI: Veronica Finley is a 73 y.o. female who presents to the office today to address the problems listed above in the chief complaint. Discussed the use of AI scribe software for clinical note transcription with the patient, who gave verbal consent to proceed.  History of Present Illness Veronica Finley "Veronica Finley" is a 73 year old female with a history of arthritis and lumbar stenosis who presents with worsening low back pain radiating to the left leg.  She has been experiencing worsening low back pain for over two weeks, radiating down her left buttock and leg, causing her to limp. The pain is severe enough to limit her walking distance, and she sometimes feels as though her leg might give way. No bowel or bladder incontinence is reported, and there is no significant weakness in the leg outside of the sensation of it giving way.  She has a history of arthritis and lumbar stenosis, which have been problematic in the past, but the current pain is more severe. She recalls a previous MRI indicating some curvature of the spine, though she is unsure of the cause. For pain management, she is currently taking Tylenol , which does not alleviate her symptoms. She mentions a past experience with an unsuccessful attempt at a nerve block injection, which resulted in significant discomfort.  She also mentions a recent hip replacement, which took a long time to recover from, and she wonders if it might be contributing to her current symptoms.  Obesity: She had been on Zepbound .  It worked very well with her without side effects.  She was only able to obtain 1 months worth.  The insurance denied it.  However, since, Zepbound  has a new indication including sleep apnea for which she suffers from.  She has lost 10 pounds and has been able to keep it off.  She would like  to restart the GLP-1 if her insurance would cover it.  Diet is fair.  Exercise, she keeps very active however her current back symptoms are limiting that.  Reviewed recent neurology note.  Follow-up for renal cell carcinoma status post nephrectomy: Scans are negative without evidence of recurrence.  Lab work shows GFR is slightly declined at 41 down from 51 is her baseline.  Referral to nephrology has been placed for monitoring.  She will follow-up in 1 year.  Otherwise feeling well.  Assessment  1. Lumbar back pain with radiculopathy affecting left lower extremity   2. Trochanteric bursitis of left hip   3. Morbid obesity (HCC)   4. OSA on CPAP   5. Chronic kidney disease, stage 3b (HCC)   6. Renal cell carcinoma of left kidney [facility])      Plan  Assessment and Plan Assessment & Plan Chronic low back pain with radiculopathy and hip bursitis, left Worsening pain radiating down the left leg, causing limp and sensation of leg giving way. No bowel or bladder incontinence. Current acetaminophen  ineffective.  Symptoms most consistent with lumbar etiology, and hip bursitis.  Doubt hip joint problem at this time.  Reassured. -Prednisone  taper to calm down inflammation and pain.  Physical therapy for conservative management. - Consider orthopedic referral for further evaluation and management. - Evaluate prescribing prednisone  for inflammation and pain management.  Lumbar spinal stenosis Contributing to chronic low back pain with recent exacerbation  and radiculopathy.  Osteoarthritis Contributing to chronic pain and mobility issues, exacerbated by spinal stenosis.  Chronic kidney disease with history of renal cell carcinoma Under management with suboptimal creatinine levels. - Follow up with nephrology appointment on May 20 with Dr. Higinio Love.  Obstructive sleep apnea and morbid obesity: Zepbound  ordered.  Patient is tolerated well.  Needs weight loss.  Follow up: October for complete physical,  sooner if back pain unresolved Orders Placed This Encounter  Procedures   Ambulatory referral to Physical Therapy   Meds ordered this encounter  Medications   tirzepatide  (ZEPBOUND ) 2.5 MG/0.5ML Pen    Sig: Inject 2.5 mg into the skin once a week.    Dispense:  2 mL    Refill:  2   predniSONE  (DELTASONE ) 10 MG tablet    Sig: Take 4 tabs qd x 2 days, 3 qd x 2 days, 2 qd x 2d, 1qd x 3 days    Dispense:  21 tablet    Refill:  0     I reviewed the patients updated PMH, FH, and SocHx.  Patient Active Problem List   Diagnosis Date Noted   Prediabetes 01/21/2023    Priority: High   Morbid obesity (HCC) 07/17/2022    Priority: High   Chronic kidney disease, stage 3b (HCC) 06/28/2021    Priority: High   Renal cell carcinoma (HCC) - s/p left nephrectomy 12/14/2020    Priority: High   Status post nephrectomy 10/24/2020    Priority: High   OSA on CPAP 10/26/2018    Priority: High   Hx of laparoscopic gastric banding+ truncal vagotomy 03/24/2006 01/10/2014    Priority: High   Spinal stenosis of lumbar region 07/08/2013    Priority: High   Major depression, recurrent, chronic (HCC) 07/08/2013    Priority: High   Essential hypertension 04/06/2012    Priority: High   Acquired hypothyroidism 10/08/2011    Priority: High   Mixed hyperlipidemia 10/08/2011    Priority: High   S/P knee replacement 04/02/2019    Priority: Medium    Restless leg syndrome 12/20/2010    Priority: Medium    Osteoarthritis of multiple joints 12/17/2010    Priority: Medium    Osteoporosis screening 01/08/2024    Priority: Low   Status post total replacement of right hip 05/11/2021    Priority: Low   Status post total replacement of left hip 02/09/2021    Priority: Low   Vitamin B12 deficiency 06/02/2020    Priority: Low   Contact dermatitis due to chemicals 01/25/2019    Priority: Low   Generalized hyperhidrosis 07/15/2016    Priority: Low   AR (allergic rhinitis) 12/20/2010    Priority: Low    Current Meds  Medication Sig   acetaminophen  (TYLENOL ) 500 MG tablet Take 1,000 mg by mouth every 6 (six) hours as needed for moderate pain.   cholecalciferol  (VITAMIN D3) 25 MCG (1000 UNIT) tablet Take 1,000 Units by mouth daily.   diltiazem  (CARDIZEM  CD) 240 MG 24 hr capsule TAKE 1 CAPSULE EVERY DAY   FLUoxetine  (PROZAC ) 20 MG capsule TAKE 1 CAPSULE EVERY DAY (NEED MD APPOINTMENT)   levothyroxine  (SYNTHROID ) 150 MCG tablet Take 1 tablet (150 mcg total) by mouth daily.   losartan -hydrochlorothiazide  (HYZAAR) 100-25 MG tablet TAKE 1 TABLET EVERY DAY (NEED MD APPOINTMENT)   predniSONE  (DELTASONE ) 10 MG tablet Take 4 tabs qd x 2 days, 3 qd x 2 days, 2 qd x 2d, 1qd x 3 days   rOPINIRole  (REQUIP  XL) 4  MG 24 hr tablet TAKE 1 TABLET AT BEDTIME   rosuvastatin  (CRESTOR ) 10 MG tablet TAKE 1 TABLET AT BEDTIME   tirzepatide  (ZEPBOUND ) 2.5 MG/0.5ML Pen Inject 2.5 mg into the skin once a week.   vitamin B-12 (CYANOCOBALAMIN ) 1000 MCG tablet Take 1,000 mcg by mouth daily.   vitamin C (ASCORBIC ACID ) 500 MG tablet Take 500 mg by mouth 3 (three) times a week.   Allergies: Patient is allergic to thimerosal and thimerosal (thiomersal). Family History: Patient family history includes Arthritis in her brother, mother, and sister; Atrial fibrillation in her mother; Diabetes in her brother; Heart disease in her father; Hyperlipidemia in her brother and mother; Hypertension in her mother; Leukemia in her father. Social History:  Patient  reports that she has never smoked. She has never used smokeless tobacco. She reports current alcohol use. She reports that she does not use drugs.  Review of Systems: Constitutional: Negative for fever malaise or anorexia Cardiovascular: negative for chest pain Respiratory: negative for SOB or persistent cough Gastrointestinal: negative for abdominal pain  Objective  Vitals: BP 108/66   Pulse 65   Temp 97.7 F (36.5 C)   Ht 5\' 6"  (1.676 m)   Wt 240 lb 3.2 oz (109 kg)    SpO2 94%   BMI 38.77 kg/m  General: no acute distress , A&Ox3 Left hip: Tender over greater trochanteric bursa, lateral lumbar tenderness without sciatic notch tenderness.  Normal strength.  Normal gait. Commons side effects, risks, benefits, and alternatives for medications and treatment plan prescribed today were discussed, and the patient expressed understanding of the given instructions. Patient is instructed to call or message via MyChart if he/she has any questions or concerns regarding our treatment plan. No barriers to understanding were identified. We discussed Red Flag symptoms and signs in detail. Patient expressed understanding regarding what to do in case of urgent or emergency type symptoms.  Medication list was reconciled, printed and provided to the patient in AVS. Patient instructions and summary information was reviewed with the patient as documented in the AVS. This note was prepared with assistance of Dragon voice recognition software. Occasional wrong-word or sound-a-like substitutions may have occurred due to the inherent limitations of voice recognition software

## 2024-03-29 NOTE — Telephone Encounter (Signed)
 Pharmacy Patient Advocate Encounter   Received notification from Patient Pharmacy that prior authorization for Zepbound  2.5 is required/requested.   Insurance verification completed.   The patient is insured through [employer] .   Per test claim: PA required; PA submitted to above mentioned insurance via CoverMyMeds Key/confirmation #/EOC [id number] Status is pending

## 2024-03-30 ENCOUNTER — Other Ambulatory Visit (HOSPITAL_COMMUNITY): Payer: Self-pay

## 2024-03-30 NOTE — Telephone Encounter (Signed)
 Noted.

## 2024-03-30 NOTE — Telephone Encounter (Signed)
 Clinical questions were answered via phone.Veronica Finley

## 2024-03-31 ENCOUNTER — Other Ambulatory Visit (HOSPITAL_COMMUNITY): Payer: Self-pay

## 2024-03-31 NOTE — Telephone Encounter (Signed)
 Pharmacy Patient Advocate Encounter  Received notification from [employer] that Prior Authorization for Zepbound  2.5MG /0.5ML pen-injectors has been APPROVED from 03/31/24 to 11/17/24. Unable to obtain price due to refill too soon rejection, last fill date 03/31/24 next available fill date06/04/25   PA #/Case ID/Reference #: [id number]

## 2024-04-06 DIAGNOSIS — R7303 Prediabetes: Secondary | ICD-10-CM | POA: Diagnosis not present

## 2024-04-06 DIAGNOSIS — I10 Essential (primary) hypertension: Secondary | ICD-10-CM | POA: Diagnosis not present

## 2024-04-06 DIAGNOSIS — N1832 Chronic kidney disease, stage 3b: Secondary | ICD-10-CM | POA: Diagnosis not present

## 2024-04-06 DIAGNOSIS — Z905 Acquired absence of kidney: Secondary | ICD-10-CM | POA: Diagnosis not present

## 2024-04-06 DIAGNOSIS — E669 Obesity, unspecified: Secondary | ICD-10-CM | POA: Diagnosis not present

## 2024-04-07 NOTE — Therapy (Signed)
 OUTPATIENT PHYSICAL THERAPY THORACOLUMBAR EVALUATION   Patient Name: Veronica Finley MRN: [id number] DOB:Jan 27, 1951, 73 y.o., female Today's Date: 04/08/2024  END OF SESSION:  PT End of Session - 04/08/24 1017     Visit Number 1    Number of Visits 16    Date for PT Re-Evaluation 07/01/24    Authorization Type humana- 12 visits approved 5/22 to 7/11    Authorization - Visit Number 1    Authorization - Number of Visits 12    Progress Note Due on Visit 10    PT Start Time 1019    PT Stop Time 1059    PT Time Calculation (min) 40 min    Activity Tolerance Patient tolerated treatment well    Behavior During Therapy [facility] for tasks assessed/performed             Past Medical History:  Diagnosis Date   Anemia    Arthritis    Cancer (HCC)    COVID-19 08/28/2020   Depression    Family history of adverse reaction to anesthesia    mother and daughter have severe PONV   Hyperlipidemia    Hypertension    Hypothyroidism    Lumbar spinal stenosis 07/08/2013   OSA on CPAP 10/26/2018   Pre-diabetes    Renal mass 09/2020   Past Surgical History:  Procedure Laterality Date   GASTRIC BYPASS     KNEE ARTHROSCOPY     ROBOTIC ASSITED PARTIAL NEPHRECTOMY Left 09/22/2020   Procedure: XI ROBOTIC ASSITED LAPAROSCOPIC  TOTAL NEPHRECTOMY; RESECTION OF ABDOMINAL MASS;  Surgeon: Adelbert Homans, MD;  Location: [facility];  Service: Urology;  Laterality: Left;   SPINE SURGERY     TONSILLECTOMY     TOTAL HIP ARTHROPLASTY Left 02/09/2021   Procedure: LEFT TOTAL HIP ARTHROPLASTY ANTERIOR APPROACH;  Surgeon: Arnie Lao, MD;  Location: [facility];  Service: Orthopedics;  Laterality: Left;   TOTAL HIP ARTHROPLASTY Right 05/11/2021   Procedure: RIGHT TOTAL HIP ARTHROPLASTY ANTERIOR APPROACH;  Surgeon: Arnie Lao, MD;  Location: [facility];  Service: Orthopedics;  Laterality: Right;   TOTAL KNEE ARTHROPLASTY Right 04/02/2019   Procedure: TOTAL KNEE ARTHROPLASTY;  Surgeon:  Genevie Kerns, MD;  Location: [facility];  Service: Orthopedics;  Laterality: Right;  spinal plus adductor canal   TOTAL KNEE ARTHROPLASTY Left 08/20/2019   Procedure: TOTAL KNEE ARTHROPLASTY;  Surgeon: Genevie Kerns, MD;  Location: [facility];  Service: Orthopedics;  Laterality: Left;  with adductor canal   TUBAL LIGATION     UVULECTOMY     VENTRAL HERNIA REPAIR N/A 11/23/2020   Procedure: VENTRAL HERNIA REPAIR WITH MESH;  Surgeon: Oza Blumenthal, MD;  Location: [facility];  Service: General;  Laterality: N/A;  LMA   Patient Active Problem List   Diagnosis Date Noted   Osteoporosis screening 01/08/2024   Prediabetes 01/21/2023   Morbid obesity (HCC) 07/17/2022   Chronic kidney disease, stage 3b (HCC) 06/28/2021   Status post total replacement of right hip 05/11/2021   Status post total replacement of left hip 02/09/2021   Renal cell carcinoma (HCC) - s/p left nephrectomy 12/14/2020   Status post nephrectomy 10/24/2020   Vitamin B12 deficiency 06/02/2020   S/P knee replacement 04/02/2019   Contact dermatitis due to chemicals 01/25/2019   OSA on CPAP 10/26/2018   Generalized hyperhidrosis 07/15/2016   Hx of laparoscopic gastric banding+ truncal vagotomy 03/24/2006 01/10/2014   Spinal stenosis of lumbar region 07/08/2013   Major depression, recurrent, chronic (HCC) 07/08/2013  Essential hypertension 04/06/2012   Acquired hypothyroidism 10/08/2011   Mixed hyperlipidemia 10/08/2011   AR (allergic rhinitis) 12/20/2010   Restless leg syndrome 12/20/2010   Osteoarthritis of multiple joints 12/17/2010    PCP:  Luevenia Saha, MD  REFERRING PROVIDER:  Luevenia Saha, MD  REFERRING DIAG: [id number] (ICD-10-CM) - Lumbar back pain with radiculopathy affecting left lower extremity  Rationale for Evaluation and Treatment: Rehabilitation  THERAPY DIAG:  Other low back pain - Plan: PT plan of care cert/re-cert  Muscle weakness (generalized) - Plan: PT plan of care  cert/re-cert  Pain in left hip  ONSET DATE: back - years   SUBJECTIVE:                                                                                                                                                                                           SUBJECTIVE STATEMENT: Recently patient has had in increase in back pain about a month ago. States that her left hip and back was hurting going down into her leg to her lower leg.  States she went on prednisone  and it has helped. States that her radicular symptoms are a little better now.   Hip bursitis is tender to the touch along the left side. States she slipped the other day, didn't fall but feels like she hurt her hip a little bit more.   Very active no current workout routine.  Wants to get stronger and maintain her mobility     PERTINENT HISTORY:  CKD, left kidney removal due to cancer,  R TKA 2020, L TKA 2020, anterior approaches Left THA 2022, Right THA 2022, spine surgery fusion 20 years ago unknown levels  PAIN:  Are you having pain? Yes: NPRS scale: 3/10 Pain location: left low back and into left leg Pain description: dull achy Aggravating factors: walking, standing for a long time Relieving factors: ice, prednisone ,   PRECAUTIONS: None  RED FLAGS: None   WEIGHT BEARING RESTRICTIONS: No  FALLS:  Has patient fallen in last 6 months? No     OCCUPATION: part time - stands on her feet - - [employer]  PLOF: Independent  PATIENT GOALS: to get stronger - core needs to be stronger     OBJECTIVE:  Note: Objective measures were completed at Evaluation unless otherwise noted.  DIAGNOSTIC FINDINGS:  No recent lumbar imaging  PATIENT SURVEYS:  Patient-specific activity functional scoring scheme (Point to one number):  "0" represents "unable to perform." "10" represents "able to perform at prior level. 0 1 2 3 4 5 6 7 8 9  10 (Date and Score) Activity Initial  Activity Eval  Walk a 1/2 mile 2      Standing for 7 hours   5    Lifting a sewing machine (30#) 5    Additional Additional Total score = sum of the activity scores/number of activities Minimum detectable change (90%CI) for average score = 2 points Minimum detectable change (90%CI) for single activity score = 3 points PSFS developed by: [place] Spitz., & Binkley, J. (1995). Assessing disability and change on individual  patients: a report of a patient specific measure. Physiotherapy [place], [phone], [phone]. Reproduced with the permission of the authors  Score: 12/3=4   COGNITION: Overall cognitive status: Within functional limits for tasks assessed     SENSATION: Not tested   POSTURE: rounded shoulders and forward head  PALPATION: Tenderness to palpation along left lumbar paraspinals and bilateral glutes and lateral legs  LUMBAR ROM:   AROM eval  Flexion 50% limited  Extension 100% limited*  Right lateral flexion 25% limited*  Left lateral flexion 25% limited*  Right rotation   Left rotation    (Blank rows = not tested) *creaky/discomfort noted   LE Measurements Lower Extremity Right EVAL Left EVAL   A/PROM MMT A/PROM MMT  Hip Flexion WFL 3+ WFL 4  Hip Extension WFL 4- WFL 3+  Hip Abduction      Hip Adduction      Hip Internal rotation 60  60   Hip External rotation 45*  45*   Knee Flexion  4-  4  Knee Extension  4+  4+  Ankle Dorsiflexion  4  4+  Ankle Plantarflexion      Ankle Inversion      Ankle Eversion       (Blank rows = not tested) * pain   LUMBAR SPECIAL TESTS:  slump neg B SLR neg B    GAIT: Distance walked: 25 feet within clinic Assistive device utilized: None Level of assistance: Complete Independence Comments: wide BOS limited hip ROM  TREATMENT DATE:                                                                                                                               04/08/2024  Therapeutic Exercise:  Aerobic: Supine: Bridges with hip  with distance for feet times ten 5-second holds Prone: Lying on stomach with 1 pillow 3 minutes, hamstring curls x 10 bilaterally  Seated:  Standing: Neuromuscular Re-education: Long exhale breathing for improved core activation and posture 6 minutes Manual Therapy: Therapeutic Activity: Self Care: Trigger Point Dry Needling:  Modalities:     PATIENT EDUCATION:  Education details: on current presentation, on HEP, on clinical outcomes score and POC, on posture and breathing mechanics as well as anatomy and rationale behind all interventions Person educated: Patient Education method: Explanation, Demonstration, and Handouts Education comprehension: verbalized understanding   HOME EXERCISE PROGRAM: [id number] Long exhale breathing in hook lying  ASSESSMENT:  CLINICAL IMPRESSION: Patient presents to physical therapy with complaints  of chronic low back pain with recent episode of increased acute low back pain about a month ago.  Symptoms presented worse than previously with them radiating all the way down to lower leg.  Patient presents with strength deficits as well as movement dysfunctions and overall postural deficits that are likely contributing to current presentation.  Patient has had a lumbar fusion about 20 years ago for unknown levels which may contribute to overall prognosis.  Educated patient and current findings as well as plan moving forward.  Patient would greatly benefit from skilled PT to address physical impairments and return her to optimal function.  OBJECTIVE IMPAIRMENTS: decreased activity tolerance, decreased mobility, difficulty walking, decreased ROM, decreased strength, improper body mechanics, postural dysfunction, and pain.   ACTIVITY LIMITATIONS: lifting, bending, standing, and locomotion level  PARTICIPATION LIMITATIONS: occupation  PERSONAL FACTORS: Fitness, Time since onset of injury/illness/exacerbation, and 1-2 comorbidities: B THA, lumbar fusion are also  affecting patient's functional outcome.   REHAB POTENTIAL: Good  CLINICAL DECISION MAKING: Stable/uncomplicated  EVALUATION COMPLEXITY: Low   GOALS: Goals reviewed with patient? yes  SHORT TERM GOALS: Target date: 05/20/2024   Patient will be independent in self management strategies to improve quality of life and functional outcomes. Baseline: New Program Goal status: INITIAL  2.  Patient will report at least 50% improvement in overall symptoms and/or function to demonstrate improved functional mobility Baseline: 0% better Goal status: INITIAL  3.  Patient will demonstrate long exhale breathing with improved core activation to demonstrate improved core function Baseline: Unable Goal status: INITIAL    LONG TERM GOALS: Target date: 07/01/2024    Patient will report at least 75% improvement in overall symptoms and/or function to demonstrate improved functional mobility Baseline: 0% better Goal status: INITIAL  2.  Patient will score at least 2 points higher on PSFS average to demonstrate change in overall function. Baseline: see above Goal status: INITIAL  3.  Patient will report reduction in radicular symptoms by at least 50% to demonstrate improved quality of life Baseline: 0% Goal status: INITIAL   PLAN:  PT FREQUENCY: 1-2x/week for a total of 16 visits over 12 week certification period  PT DURATION: 12 weeks  PLANNED INTERVENTIONS: 97110-Therapeutic exercises, 97530- Therapeutic activity, 97112- Neuromuscular re-education, 97535- Self Care, 08657- Manual therapy, [phone]- Gait training, [phone]- Orthotic Fit/training, [phone]- Canalith repositioning, [id number]- Aquatic Therapy, 97014- Electrical stimulation (unattended), [phone]- Ionotophoresis 4mg /ml Dexamethasone , Patient/Family education, Balance training, Stair training, Taping, Dry Needling, Joint mobilization, Joint manipulation, Spinal manipulation, Spinal mobilization, Cryotherapy, and Moist heat   PLAN FOR NEXT  SESSION: Prone interventions, strength and stability interventions, breathing exercises for posture and body awareness   11:22 AM, 04/08/24 Tabitha Ewings, DPT Physical Therapy with [facility]

## 2024-04-08 ENCOUNTER — Ambulatory Visit: Admitting: Physical Therapy

## 2024-04-08 ENCOUNTER — Encounter: Payer: Self-pay | Admitting: Physical Therapy

## 2024-04-08 DIAGNOSIS — M5459 Other low back pain: Secondary | ICD-10-CM | POA: Diagnosis not present

## 2024-04-08 DIAGNOSIS — M25552 Pain in left hip: Secondary | ICD-10-CM | POA: Diagnosis not present

## 2024-04-08 DIAGNOSIS — M6281 Muscle weakness (generalized): Secondary | ICD-10-CM

## 2024-04-09 DIAGNOSIS — G4733 Obstructive sleep apnea (adult) (pediatric): Secondary | ICD-10-CM | POA: Diagnosis not present

## 2024-04-09 DIAGNOSIS — I10 Essential (primary) hypertension: Secondary | ICD-10-CM | POA: Diagnosis not present

## 2024-04-26 ENCOUNTER — Encounter: Admitting: Physical Therapy

## 2024-04-28 ENCOUNTER — Ambulatory Visit: Admitting: Physical Therapy

## 2024-04-28 ENCOUNTER — Encounter: Admitting: Physical Therapy

## 2024-04-28 ENCOUNTER — Encounter: Payer: Self-pay | Admitting: Physical Therapy

## 2024-04-28 DIAGNOSIS — M25552 Pain in left hip: Secondary | ICD-10-CM

## 2024-04-28 DIAGNOSIS — M5459 Other low back pain: Secondary | ICD-10-CM

## 2024-04-28 DIAGNOSIS — M6281 Muscle weakness (generalized): Secondary | ICD-10-CM

## 2024-04-28 NOTE — Therapy (Signed)
 OUTPATIENT PHYSICAL THERAPY THORACOLUMBAR TREATMENT    Patient Name: Veronica Finley MRN: [id number] DOB:04/04/1951, 73 y.o., female Today's Date: 04/28/2024  END OF SESSION:  PT End of Session - 04/28/24 1601     Visit Number 2    Number of Visits 16    Date for PT Re-Evaluation 07/01/24    Authorization Type humana- 12 visits approved 5/22 to 7/11    Authorization - Visit Number 2    Authorization - Number of Visits 12    Progress Note Due on Visit 10    PT Start Time 1603    PT Stop Time 1642    PT Time Calculation (min) 39 min    Activity Tolerance Patient tolerated treatment well    Behavior During Therapy [facility] for tasks assessed/performed             Past Medical History:  Diagnosis Date   Anemia    Arthritis    Cancer (HCC)    COVID-19 08/28/2020   Depression    Family history of adverse reaction to anesthesia    mother and daughter have severe PONV   Hyperlipidemia    Hypertension    Hypothyroidism    Lumbar spinal stenosis 07/08/2013   OSA on CPAP 10/26/2018   Pre-diabetes    Renal mass 09/2020   Past Surgical History:  Procedure Laterality Date   GASTRIC BYPASS     KNEE ARTHROSCOPY     ROBOTIC ASSITED PARTIAL NEPHRECTOMY Left 09/22/2020   Procedure: XI ROBOTIC ASSITED LAPAROSCOPIC  TOTAL NEPHRECTOMY; RESECTION OF ABDOMINAL MASS;  Surgeon: Adelbert Homans, MD;  Location: [facility];  Service: Urology;  Laterality: Left;   SPINE SURGERY     TONSILLECTOMY     TOTAL HIP ARTHROPLASTY Left 02/09/2021   Procedure: LEFT TOTAL HIP ARTHROPLASTY ANTERIOR APPROACH;  Surgeon: Arnie Lao, MD;  Location: [facility];  Service: Orthopedics;  Laterality: Left;   TOTAL HIP ARTHROPLASTY Right 05/11/2021   Procedure: RIGHT TOTAL HIP ARTHROPLASTY ANTERIOR APPROACH;  Surgeon: Arnie Lao, MD;  Location: [facility];  Service: Orthopedics;  Laterality: Right;   TOTAL KNEE ARTHROPLASTY Right 04/02/2019   Procedure: TOTAL KNEE ARTHROPLASTY;  Surgeon:  Genevie Kerns, MD;  Location: [facility];  Service: Orthopedics;  Laterality: Right;  spinal plus adductor canal   TOTAL KNEE ARTHROPLASTY Left 08/20/2019   Procedure: TOTAL KNEE ARTHROPLASTY;  Surgeon: Genevie Kerns, MD;  Location: [facility];  Service: Orthopedics;  Laterality: Left;  with adductor canal   TUBAL LIGATION     UVULECTOMY     VENTRAL HERNIA REPAIR N/A 11/23/2020   Procedure: VENTRAL HERNIA REPAIR WITH MESH;  Surgeon: Oza Blumenthal, MD;  Location: [facility];  Service: General;  Laterality: N/A;  LMA   Patient Active Problem List   Diagnosis Date Noted   Osteoporosis screening 01/08/2024   Prediabetes 01/21/2023   Morbid obesity (HCC) 07/17/2022   Chronic kidney disease, stage 3b (HCC) 06/28/2021   Status post total replacement of right hip 05/11/2021   Status post total replacement of left hip 02/09/2021   Renal cell carcinoma (HCC) - s/p left nephrectomy 12/14/2020   Status post nephrectomy 10/24/2020   Vitamin B12 deficiency 06/02/2020   S/P knee replacement 04/02/2019   Contact dermatitis due to chemicals 01/25/2019   OSA on CPAP 10/26/2018   Generalized hyperhidrosis 07/15/2016   Hx of laparoscopic gastric banding+ truncal vagotomy 03/24/2006 01/10/2014   Spinal stenosis of lumbar region 07/08/2013   Major depression, recurrent, chronic (HCC)  07/08/2013   Essential hypertension 04/06/2012   Acquired hypothyroidism 10/08/2011   Mixed hyperlipidemia 10/08/2011   AR (allergic rhinitis) 12/20/2010   Restless leg syndrome 12/20/2010   Osteoarthritis of multiple joints 12/17/2010    PCP:  Luevenia Saha, MD  REFERRING PROVIDER:  Luevenia Saha, MD  REFERRING DIAG: [phone] (ICD-10-CM) - Lumbar back pain with radiculopathy affecting left lower extremity  Rationale for Evaluation and Treatment: Rehabilitation  THERAPY DIAG:  Other low back pain  Muscle weakness (generalized)  Pain in left hip  ONSET DATE: back - years   SUBJECTIVE:                                                                                                                                                                                            SUBJECTIVE STATEMENT: 04/28/2024 Her bursitis is bothering her a bit more and last night it was bothering her but was on feet all day. Lower back is feeling better. States she is doing her exercises everyday.  EVAL:Recently patient has had in increase in back pain about a month ago. States that her left hip and back was hurting going down into her leg to her lower leg.  States she went on prednisone  and it has helped. States that her radicular symptoms are a little better now.   Hip bursitis is tender to the touch along the left side. States she slipped the other day, didn't fall but feels like she hurt her hip a little bit more.   Very active no current workout routine.  Wants to get stronger and maintain her mobility     PERTINENT HISTORY:  CKD, left kidney removal due to cancer,  R TKA 2020, L TKA 2020, anterior approaches Left THA 2022, Right THA 2022, spine surgery fusion 20 years ago unknown levels  PAIN:  Are you having pain? Yes: NPRS scale: 2/10 Pain location: hips  Pain description: dull achy Aggravating factors: walking, standing for a long time Relieving factors: ice, prednisone ,   PRECAUTIONS: None  RED FLAGS: None   WEIGHT BEARING RESTRICTIONS: No  FALLS:  Has patient fallen in last 6 months? No     OCCUPATION: part time - stands on her feet - - [employer]  PLOF: Independent  PATIENT GOALS: to get stronger - core needs to be stronger     OBJECTIVE:  Note: Objective measures were completed at Evaluation unless otherwise noted.  DIAGNOSTIC FINDINGS:  No recent lumbar imaging  PATIENT SURVEYS:  Patient-specific activity functional scoring scheme (Point to one number):  0 represents "unable to perform." 10 represents "able to perform at prior level. 0  1 2 3 4 5 6 7 8 9  10  (Date and Score) Activity Initial  Activity Eval     Walk a 1/2 mile 2     Standing for 7 hours   5    Lifting a sewing machine (30#) 5    Additional Additional Total score = sum of the activity scores/number of activities Minimum detectable change (90%CI) for average score = 2 points Minimum detectable change (90%CI) for single activity score = 3 points PSFS developed by: [place] Spitz., & Binkley, J. (1995). Assessing disability and change on individual  patients: a report of a patient specific measure. Physiotherapy [place], 47, [phone]. Reproduced with the permission of the authors  Score: 12/3=4   COGNITION: Overall cognitive status: Within functional limits for tasks assessed     SENSATION: Not tested   POSTURE: rounded shoulders and forward head  PALPATION: Tenderness to palpation along left lumbar paraspinals and bilateral glutes and lateral legs  LUMBAR ROM:   AROM eval  Flexion 50% limited  Extension 100% limited*  Right lateral flexion 25% limited*  Left lateral flexion 25% limited*  Right rotation   Left rotation    (Blank rows = not tested) *creaky/discomfort noted   LE Measurements Lower Extremity Right EVAL Left EVAL   A/PROM MMT A/PROM MMT  Hip Flexion WFL 3+ WFL 4  Hip Extension WFL 4- WFL 3+  Hip Abduction      Hip Adduction      Hip Internal rotation 60  60   Hip External rotation 45*  45*   Knee Flexion  4-  4  Knee Extension  4+  4+  Ankle Dorsiflexion  4  4+  Ankle Plantarflexion      Ankle Inversion      Ankle Eversion       (Blank rows = not tested) * pain   LUMBAR SPECIAL TESTS:  slump neg B SLR neg B    GAIT: Distance walked: 25 feet within clinic Assistive device utilized: None Level of assistance: Complete Independence Comments: wide BOS limited hip ROM  TREATMENT DATE:                                                                                                                                04/28/2024  Therapeutic Exercise:  Review of HEP  Supine: hip add one at a time 2 minutes, hip abd 2 minutes one at a time with belt, Bridges x20 5-second holds, bent knee fall outs 2 minutes with control, hip IR 2 minutes Prone: Lying on stomach with 1 pillow 3 minutes, hamstring curls x 10 bilaterally  Seated:  S/l: clamshells 3x5 L, reverse clamshells 3x5 L  Standing: Neuromuscular Re-education: Long exhale breathing for improved core activation and posture 3 minutes Manual Therapy: vibration with percussion gun and padding to left hip - tolerated well  Therapeutic Activity: Self Care: Trigger Point Dry Needling:  Modalities:  PATIENT EDUCATION:  Education details: on HEP Person educated: Patient Education method: Programmer, multimedia, Facilities manager, and Handouts Education comprehension: verbalized understanding   HOME EXERCISE PROGRAM: [id number] Long exhale breathing in hook lying  ASSESSMENT:  CLINICAL IMPRESSION: 04/28/2024 Session focused on progression of exercises and pain management for hip pain. Tolerated heat and vibration therapy well reporting reduced pain afterwards. Fatigue with breathing and strengthening exercises noted. Reduced pain end of session. Will continue with current POC as tolerated.   EVAL: Patient presents to physical therapy with complaints of chronic low back pain with recent episode of increased acute low back pain about a month ago.  Symptoms presented worse than previously with them radiating all the way down to lower leg.  Patient presents with strength deficits as well as movement dysfunctions and overall postural deficits that are likely contributing to current presentation.  Patient has had a lumbar fusion about 20 years ago for unknown levels which may contribute to overall prognosis.  Educated patient and current findings as well as plan moving forward.  Patient would greatly benefit from skilled PT to address physical impairments and return  her to optimal function.  OBJECTIVE IMPAIRMENTS: decreased activity tolerance, decreased mobility, difficulty walking, decreased ROM, decreased strength, improper body mechanics, postural dysfunction, and pain.   ACTIVITY LIMITATIONS: lifting, bending, standing, and locomotion level  PARTICIPATION LIMITATIONS: occupation  PERSONAL FACTORS: Fitness, Time since onset of injury/illness/exacerbation, and 1-2 comorbidities: B THA, lumbar fusion are also affecting patient's functional outcome.   REHAB POTENTIAL: Good  CLINICAL DECISION MAKING: Stable/uncomplicated  EVALUATION COMPLEXITY: Low   GOALS: Goals reviewed with patient? yes  SHORT TERM GOALS: Target date: 05/20/2024   Patient will be independent in self management strategies to improve quality of life and functional outcomes. Baseline: New Program Goal status: INITIAL  2.  Patient will report at least 50% improvement in overall symptoms and/or function to demonstrate improved functional mobility Baseline: 0% better Goal status: INITIAL  3.  Patient will demonstrate long exhale breathing with improved core activation to demonstrate improved core function Baseline: Unable Goal status: INITIAL    LONG TERM GOALS: Target date: 07/01/2024    Patient will report at least 75% improvement in overall symptoms and/or function to demonstrate improved functional mobility Baseline: 0% better Goal status: INITIAL  2.  Patient will score at least 2 points higher on PSFS average to demonstrate change in overall function. Baseline: see above Goal status: INITIAL  3.  Patient will report reduction in radicular symptoms by at least 50% to demonstrate improved quality of life Baseline: 0% Goal status: INITIAL   PLAN:  PT FREQUENCY: 1-2x/week for a total of 16 visits over 12 week certification period  PT DURATION: 12 weeks  PLANNED INTERVENTIONS: 97110-Therapeutic exercises, 97530- Therapeutic activity, 97112- Neuromuscular  re-education, 97535- Self Care, 09811- Manual therapy, [phone]- Gait training, [phone]- Orthotic Fit/training, [phone]- Canalith repositioning, [id number]- Aquatic Therapy, 97014- Electrical stimulation (unattended), [phone]- Ionotophoresis 4mg /ml Dexamethasone , Patient/Family education, Balance training, Stair training, Taping, Dry Needling, Joint mobilization, Joint manipulation, Spinal manipulation, Spinal mobilization, Cryotherapy, and Moist heat   PLAN FOR NEXT SESSION: Prone interventions, strength and stability interventions, breathing exercises for posture and body awareness   4:47 PM, 04/28/24 Tabitha Ewings, DPT Physical Therapy with [place]

## 2024-05-03 ENCOUNTER — Encounter: Payer: Self-pay | Admitting: Physical Therapy

## 2024-05-03 ENCOUNTER — Ambulatory Visit: Admitting: Physical Therapy

## 2024-05-03 DIAGNOSIS — M5459 Other low back pain: Secondary | ICD-10-CM | POA: Diagnosis not present

## 2024-05-03 DIAGNOSIS — M25552 Pain in left hip: Secondary | ICD-10-CM

## 2024-05-03 DIAGNOSIS — M6281 Muscle weakness (generalized): Secondary | ICD-10-CM | POA: Diagnosis not present

## 2024-05-03 NOTE — Therapy (Signed)
 OUTPATIENT PHYSICAL THERAPY THORACOLUMBAR TREATMENT    Patient Name: Veronica Finley MRN: [id number] DOB:11/10/1951, 73 y.o., female Today's Date: 05/03/2024  END OF SESSION:  PT End of Session - 05/03/24 1014     Visit Number 3    Number of Visits 16    Date for PT Re-Evaluation 07/01/24    Authorization Type humana- 12 visits approved 5/22 to 7/11    Authorization - Visit Number 3    Authorization - Number of Visits 12    Progress Note Due on Visit 10    PT Start Time 1016    PT Stop Time 1055    PT Time Calculation (min) 39 min    Activity Tolerance Patient tolerated treatment well    Behavior During Therapy [facility] for tasks assessed/performed          Past Medical History:  Diagnosis Date   Anemia    Arthritis    Cancer (HCC)    COVID-19 08/28/2020   Depression    Family history of adverse reaction to anesthesia    mother and daughter have severe PONV   Hyperlipidemia    Hypertension    Hypothyroidism    Lumbar spinal stenosis 07/08/2013   OSA on CPAP 10/26/2018   Pre-diabetes    Renal mass 09/2020   Past Surgical History:  Procedure Laterality Date   GASTRIC BYPASS     KNEE ARTHROSCOPY     ROBOTIC ASSITED PARTIAL NEPHRECTOMY Left 09/22/2020   Procedure: XI ROBOTIC ASSITED LAPAROSCOPIC  TOTAL NEPHRECTOMY; RESECTION OF ABDOMINAL MASS;  Surgeon: Adelbert Homans, MD;  Location: [facility];  Service: Urology;  Laterality: Left;   SPINE SURGERY     TONSILLECTOMY     TOTAL HIP ARTHROPLASTY Left 02/09/2021   Procedure: LEFT TOTAL HIP ARTHROPLASTY ANTERIOR APPROACH;  Surgeon: Arnie Lao, MD;  Location: [facility];  Service: Orthopedics;  Laterality: Left;   TOTAL HIP ARTHROPLASTY Right 05/11/2021   Procedure: RIGHT TOTAL HIP ARTHROPLASTY ANTERIOR APPROACH;  Surgeon: Arnie Lao, MD;  Location: [facility];  Service: Orthopedics;  Laterality: Right;   TOTAL KNEE ARTHROPLASTY Right 04/02/2019   Procedure: TOTAL KNEE ARTHROPLASTY;  Surgeon: Genevie Kerns, MD;  Location: [facility];  Service: Orthopedics;  Laterality: Right;  spinal plus adductor canal   TOTAL KNEE ARTHROPLASTY Left 08/20/2019   Procedure: TOTAL KNEE ARTHROPLASTY;  Surgeon: Genevie Kerns, MD;  Location: [facility];  Service: Orthopedics;  Laterality: Left;  with adductor canal   TUBAL LIGATION     UVULECTOMY     VENTRAL HERNIA REPAIR N/A 11/23/2020   Procedure: VENTRAL HERNIA REPAIR WITH MESH;  Surgeon: Oza Blumenthal, MD;  Location: [facility];  Service: General;  Laterality: N/A;  LMA   Patient Active Problem List   Diagnosis Date Noted   Osteoporosis screening 01/08/2024   Prediabetes 01/21/2023   Morbid obesity (HCC) 07/17/2022   Chronic kidney disease, stage 3b (HCC) 06/28/2021   Status post total replacement of right hip 05/11/2021   Status post total replacement of left hip 02/09/2021   Renal cell carcinoma (HCC) - s/p left nephrectomy 12/14/2020   Status post nephrectomy 10/24/2020   Vitamin B12 deficiency 06/02/2020   S/P knee replacement 04/02/2019   Contact dermatitis due to chemicals 01/25/2019   OSA on CPAP 10/26/2018   Generalized hyperhidrosis 07/15/2016   Hx of laparoscopic gastric banding+ truncal vagotomy 03/24/2006 01/10/2014   Spinal stenosis of lumbar region 07/08/2013   Major depression, recurrent, chronic (HCC) 07/08/2013  Essential hypertension 04/06/2012   Acquired hypothyroidism 10/08/2011   Mixed hyperlipidemia 10/08/2011   AR (allergic rhinitis) 12/20/2010   Restless leg syndrome 12/20/2010   Osteoarthritis of multiple joints 12/17/2010    PCP:  Luevenia Saha, MD  REFERRING PROVIDER:  Luevenia Saha, MD  REFERRING DIAG: [phone] (ICD-10-CM) - Lumbar back pain with radiculopathy affecting left lower extremity  Rationale for Evaluation and Treatment: Rehabilitation  THERAPY DIAG:  Other low back pain  Muscle weakness (generalized)  Pain in left hip  ONSET DATE: back - years   SUBJECTIVE:                                                                                                                                                                                            SUBJECTIVE STATEMENT: 05/03/2024 States the side lying exercise bothered her so she didn't do it and also have vertigo this morning. Reports she doesn't have it very often but does get it occasionally. Would prefer to not lay down today.  EVAL:Recently patient has had in increase in back pain about a month ago. States that her left hip and back was hurting going down into her leg to her lower leg.  States she went on prednisone  and it has helped. States that her radicular symptoms are a little better now.   Hip bursitis is tender to the touch along the left side. States she slipped the other day, didn't fall but feels like she hurt her hip a little bit more.   Very active no current workout routine.  Wants to get stronger and maintain her mobility     PERTINENT HISTORY:  CKD, left kidney removal due to cancer,  R TKA 2020, L TKA 2020, anterior approaches Left THA 2022, Right THA 2022, spine surgery fusion 20 years ago unknown levels  PAIN:  Are you having pain? Yes: NPRS scale: 5/10 Pain location: left hip Pain description: dull achy Aggravating factors: walking, standing for a long time Relieving factors: ice, prednisone ,   PRECAUTIONS: None  RED FLAGS: None   WEIGHT BEARING RESTRICTIONS: No  FALLS:  Has patient fallen in last 6 months? No     OCCUPATION: part time - stands on her feet - - [employer]  PLOF: Independent  PATIENT GOALS: to get stronger - core needs to be stronger     OBJECTIVE:  Note: Objective measures were completed at Evaluation unless otherwise noted.  DIAGNOSTIC FINDINGS:  No recent lumbar imaging  PATIENT SURVEYS:  Patient-specific activity functional scoring scheme (Point to one number):  0 represents "unable to perform." 10 represents "able to perform at prior level.  0 1  2 3 4 5 6 7 8 9 10  (Date and Score) Activity Initial  Activity Eval     Walk a 1/2 mile 2     Standing for 7 hours   5    Lifting a sewing machine (30#) 5    Additional Additional Total score = sum of the activity scores/number of activities Minimum detectable change (90%CI) for average score = 2 points Minimum detectable change (90%CI) for single activity score = 3 points PSFS developed by: [place] Spitz., & Binkley, J. (1995). Assessing disability and change on individual  patients: a report of a patient specific measure. Physiotherapy [place], 47, [phone]. Reproduced with the permission of the authors  Score: 12/3=4   COGNITION: Overall cognitive status: Within functional limits for tasks assessed     SENSATION: Not tested   POSTURE: rounded shoulders and forward head  PALPATION: Tenderness to palpation along left lumbar paraspinals and bilateral glutes and lateral legs  LUMBAR ROM:   AROM eval  Flexion 50% limited  Extension 100% limited*  Right lateral flexion 25% limited*  Left lateral flexion 25% limited*  Right rotation   Left rotation    (Blank rows = not tested) *creaky/discomfort noted   LE Measurements Lower Extremity Right EVAL Left EVAL   A/PROM MMT A/PROM MMT  Hip Flexion WFL 3+ WFL 4  Hip Extension WFL 4- WFL 3+  Hip Abduction      Hip Adduction      Hip Internal rotation 60  60   Hip External rotation 45*  45*   Knee Flexion  4-  4  Knee Extension  4+  4+  Ankle Dorsiflexion  4  4+  Ankle Plantarflexion      Ankle Inversion      Ankle Eversion       (Blank rows = not tested) * pain   LUMBAR SPECIAL TESTS:  slump neg B SLR neg B    GAIT: Distance walked: 25 feet within clinic Assistive device utilized: None Level of assistance: Complete Independence Comments: wide BOS limited hip ROM  TREATMENT DATE:                                                                                                                                05/03/2024  Therapeutic Exercise:  Review of HEP  Supine:  Seated: hip IR/ER 2 minutes, hip add iso 2 minutes alternating sides. Hip IR with ball 1.5 minutes, LAQs x5 10 holds, lumbar flexion with head up x10 5 holds, piriformis stretch x5 10 holds, modified quad stretch x5 10 holds   S/l:    Standing: hip extension 2x10 B Neuromuscular Re-education:   Manual Therapy: vibration with percussion gun and padding to left hip - tolerated well  Therapeutic Activity: Self Care: Trigger Point Dry Needling:  Modalities:     PATIENT EDUCATION:  Education details: on HEP Person educated: Patient Education method: Explanation,  Demonstration, and Handouts Education comprehension: verbalized understanding   HOME EXERCISE PROGRAM: [id number] Long exhale breathing in hook lying  ASSESSMENT:  CLINICAL IMPRESSION: 05/03/2024 Discontinued side lying exercises secondary to increased symptoms following last session. Focused on seated and standing exercises secondary to recent vertigo symptoms on this date. Tolerated exercises and manual interventions well. Educated patient on how to perform self massage at home. Reduced limping and soreness noted end of session.  EVAL: Patient presents to physical therapy with complaints of chronic low back pain with recent episode of increased acute low back pain about a month ago.  Symptoms presented worse than previously with them radiating all the way down to lower leg.  Patient presents with strength deficits as well as movement dysfunctions and overall postural deficits that are likely contributing to current presentation.  Patient has had a lumbar fusion about 20 years ago for unknown levels which may contribute to overall prognosis.  Educated patient and current findings as well as plan moving forward.  Patient would greatly benefit from skilled PT to address physical impairments and return her to optimal function.  OBJECTIVE  IMPAIRMENTS: decreased activity tolerance, decreased mobility, difficulty walking, decreased ROM, decreased strength, improper body mechanics, postural dysfunction, and pain.   ACTIVITY LIMITATIONS: lifting, bending, standing, and locomotion level  PARTICIPATION LIMITATIONS: occupation  PERSONAL FACTORS: Fitness, Time since onset of injury/illness/exacerbation, and 1-2 comorbidities: B THA, lumbar fusion are also affecting patient's functional outcome.   REHAB POTENTIAL: Good  CLINICAL DECISION MAKING: Stable/uncomplicated  EVALUATION COMPLEXITY: Low   GOALS: Goals reviewed with patient? yes  SHORT TERM GOALS: Target date: 05/20/2024   Patient will be independent in self management strategies to improve quality of life and functional outcomes. Baseline: New Program Goal status: INITIAL  2.  Patient will report at least 50% improvement in overall symptoms and/or function to demonstrate improved functional mobility Baseline: 0% better Goal status: INITIAL  3.  Patient will demonstrate long exhale breathing with improved core activation to demonstrate improved core function Baseline: Unable Goal status: INITIAL    LONG TERM GOALS: Target date: 07/01/2024    Patient will report at least 75% improvement in overall symptoms and/or function to demonstrate improved functional mobility Baseline: 0% better Goal status: INITIAL  2.  Patient will score at least 2 points higher on PSFS average to demonstrate change in overall function. Baseline: see above Goal status: INITIAL  3.  Patient will report reduction in radicular symptoms by at least 50% to demonstrate improved quality of life Baseline: 0% Goal status: INITIAL   PLAN:  PT FREQUENCY: 1-2x/week for a total of 16 visits over 12 week certification period  PT DURATION: 12 weeks  PLANNED INTERVENTIONS: 97110-Therapeutic exercises, 97530- Therapeutic activity, 97112- Neuromuscular re-education, 97535- Self Care, 13086- Manual  therapy, [phone]- Gait training, [phone]- Orthotic Fit/training, [phone]- Canalith repositioning, [id number]- Aquatic Therapy, [id number]- Electrical stimulation (unattended), [phone]- Ionotophoresis 4mg /ml Dexamethasone , Patient/Family education, Balance training, Stair training, Taping, Dry Needling, Joint mobilization, Joint manipulation, Spinal manipulation, Spinal mobilization, Cryotherapy, and Moist heat   PLAN FOR NEXT SESSION: Prone interventions, strength and stability interventions, breathing exercises for posture and body awareness   11:02 AM, 05/03/24 Tabitha Ewings, DPT Physical [facility]

## 2024-05-05 ENCOUNTER — Ambulatory Visit: Admitting: Physical Therapy

## 2024-05-05 ENCOUNTER — Encounter: Payer: Self-pay | Admitting: Physical Therapy

## 2024-05-05 DIAGNOSIS — M25552 Pain in left hip: Secondary | ICD-10-CM

## 2024-05-05 DIAGNOSIS — M5459 Other low back pain: Secondary | ICD-10-CM

## 2024-05-05 DIAGNOSIS — M6281 Muscle weakness (generalized): Secondary | ICD-10-CM

## 2024-05-05 NOTE — Therapy (Signed)
 OUTPATIENT PHYSICAL THERAPY THORACOLUMBAR TREATMENT    Patient Name: Veronica Finley MRN: [id number] DOB:1951-03-12, 73 y.o., female Today's Date: 05/05/2024  END OF SESSION:  PT End of Session - 05/05/24 1019     Visit Number 4    Number of Visits 16    Date for PT Re-Evaluation 07/01/24    Authorization Type human[employer]- 12 visits approved 5/22 to 7/11    Authorization - Visit Number 4    Authorization - Number of Visits 12    Progress Note Due on Visit 10    PT Start Time 1019    PT Stop Time 1057    PT Time Calculation (min) 38 min    Activity Tolerance Patient tolerated treatment well    Behavior During Therapy [facility] for tasks assessed/performed          Past Medical History:  Diagnosis Date   Anemia    Arthritis    Cancer (HCC)    COVID-19 08/28/2020   Depression    Family history of adverse reaction to anesthesia    mother and daughter have severe PONV   Hyperlipidemia    Hypertension    Hypothyroidism    Lumbar spinal stenosis 07/08/2013   OSA on CPAP 10/26/2018   Pre-diabetes    Renal mass 09/2020   Past Surgical History:  Procedure Laterality Date   GASTRIC BYPASS     KNEE ARTHROSCOPY     ROBOTIC ASSITED PARTIAL NEPHRECTOMY Left 09/22/2020   Procedure: XI ROBOTIC ASSITED LAPAROSCOPIC  TOTAL NEPHRECTOMY; RESECTION OF ABDOMINAL MASS;  Surgeon: Adelbert Homans, MD;  Location: [facility];  Service: Urology;  Laterality: Left;   SPINE SURGERY     TONSILLECTOMY     TOTAL HIP ARTHROPLASTY Left 02/09/2021   Procedure: LEFT TOTAL HIP ARTHROPLASTY ANTERIOR APPROACH;  Surgeon: Arnie Lao, MD;  Location: [facility];  Service: Orthopedics;  Laterality: Left;   TOTAL HIP ARTHROPLASTY Right 05/11/2021   Procedure: RIGHT TOTAL HIP ARTHROPLASTY ANTERIOR APPROACH;  Surgeon: Arnie Lao, MD;  Location: [facility];  Service: Orthopedics;  Laterality: Right;   TOTAL KNEE ARTHROPLASTY Right 04/02/2019   Procedure: TOTAL KNEE ARTHROPLASTY;  Surgeon: Genevie Kerns, MD;  Location: [facility];  Service: Orthopedics;  Laterality: Right;  spinal plus adductor canal   TOTAL KNEE ARTHROPLASTY Left 08/20/2019   Procedure: TOTAL KNEE ARTHROPLASTY;  Surgeon: Genevie Kerns, MD;  Location: [facility];  Service: Orthopedics;  Laterality: Left;  with adductor canal   TUBAL LIGATION     UVULECTOMY     VENTRAL HERNIA REPAIR N/A 11/23/2020   Procedure: VENTRAL HERNIA REPAIR WITH MESH;  Surgeon: Oza Blumenthal, MD;  Location: [facility];  Service: General;  Laterality: N/A;  LMA   Patient Active Problem List   Diagnosis Date Noted   Osteoporosis screening 01/08/2024   Prediabetes 01/21/2023   Morbid obesity (HCC) 07/17/2022   Chronic kidney disease, stage 3b (HCC) 06/28/2021   Status post total replacement of right hip 05/11/2021   Status post total replacement of left hip 02/09/2021   Renal cell carcinoma (HCC) - s/p left nephrectomy 12/14/2020   Status post nephrectomy 10/24/2020   Vitamin B12 deficiency 06/02/2020   S/P knee replacement 04/02/2019   Contact dermatitis due to chemicals 01/25/2019   OSA on CPAP 10/26/2018   Generalized hyperhidrosis 07/15/2016   Hx of laparoscopic gastric banding+ truncal vagotomy 03/24/2006 01/10/2014   Spinal stenosis of lumbar region 07/08/2013   Major depression, recurrent, chronic (HCC) 07/08/2013  Essential hypertension 04/06/2012   Acquired hypothyroidism 10/08/2011   Mixed hyperlipidemia 10/08/2011   AR (allergic rhinitis) 12/20/2010   Restless leg syndrome 12/20/2010   Osteoarthritis of multiple joints 12/17/2010    PCP:  Luevenia Saha, MD  REFERRING PROVIDER:  Luevenia Saha, MD  REFERRING DIAG: [phone] (ICD-10-CM) - Lumbar back pain with radiculopathy affecting left lower extremity  Rationale for Evaluation and Treatment: Rehabilitation  THERAPY DIAG:  Other low back pain  Muscle weakness (generalized)  Pain in left hip  ONSET DATE: back - years   SUBJECTIVE:                                                                                                                                                                                            SUBJECTIVE STATEMENT: 05/05/2024 States she tried a self maneuver for her vertigo, unsure if it helped.   EVAL:Recently patient has had in increase in back pain about a month ago. States that her left hip and back was hurting going down into her leg to her lower leg.  States she went on prednisone  and it has helped. States that her radicular symptoms are a little better now.   Hip bursitis is tender to the touch along the left side. States she slipped the other day, didn't fall but feels like she hurt her hip a little bit more.   Very active no current workout routine.  Wants to get stronger and maintain her mobility     PERTINENT HISTORY:  CKD, left kidney removal due to cancer,  R TKA 2020, L TKA 2020, anterior approaches Left THA 2022, Right THA 2022, spine surgery fusion 20 years ago unknown levels  PAIN:  Are you having pain? Yes: NPRS scale: 3/10 Pain location: left hip Pain description: dull achy Aggravating factors: walking, standing for a long time Relieving factors: ice, prednisone ,   PRECAUTIONS: None  RED FLAGS: None   WEIGHT BEARING RESTRICTIONS: No  FALLS:  Has patient fallen in last 6 months? No     OCCUPATION: part time - stands on her feet - - [employer]  PLOF: Independent  PATIENT GOALS: to get stronger - core needs to be stronger     OBJECTIVE:  Note: Objective measures were completed at Evaluation unless otherwise noted.  DIAGNOSTIC FINDINGS:  No recent lumbar imaging  PATIENT SURVEYS:  Patient-specific activity functional scoring scheme (Point to one number):  0 represents "unable to perform." 10 represents "able to perform at prior level. 0 1 2 3 4 5 6 7 8 9  10 (Date and Score) Activity Initial  Activity Eval  Walk a 1/2 mile 2     Standing for 7 hours   5     Lifting a sewing machine (30#) 5    Additional Additional Total score = sum of the activity scores/number of activities Minimum detectable change (90%CI) for average score = 2 points Minimum detectable change (90%CI) for single activity score = 3 points PSFS developed by: [place] Spitz., & Binkley, J. (1995). Assessing disability and change on individual  patients: a report of a patient specific measure. Physiotherapy [place], [phone], [phone]. Reproduced with the permission of the authors  Score: 12/3=4   COGNITION: Overall cognitive status: Within functional limits for tasks assessed     SENSATION: Not tested   POSTURE: rounded shoulders and forward head  PALPATION: Tenderness to palpation along left lumbar paraspinals and bilateral glutes and lateral legs  LUMBAR ROM:   AROM eval  Flexion 50% limited  Extension 100% limited*  Right lateral flexion 25% limited*  Left lateral flexion 25% limited*  Right rotation   Left rotation    (Blank rows = not tested) *creaky/discomfort noted   LE Measurements Lower Extremity Right EVAL Left EVAL   A/PROM MMT A/PROM MMT  Hip Flexion WFL 3+ WFL 4  Hip Extension WFL 4- WFL 3+  Hip Abduction      Hip Adduction      Hip Internal rotation 60  60   Hip External rotation 45*  45*   Knee Flexion  4-  4  Knee Extension  4+  4+  Ankle Dorsiflexion  4  4+  Ankle Plantarflexion      Ankle Inversion      Ankle Eversion       (Blank rows = not tested) * pain   LUMBAR SPECIAL TESTS:  slump neg B SLR neg B    GAIT: Distance walked: 25 feet within clinic Assistive device utilized: None Level of assistance: Complete Independence Comments: wide BOS limited hip ROM  TREATMENT DATE:                                                                                                                               05/05/2024  Therapeutic Exercise:  Review of HEP  Supine:  Seated:  on self vestibular/vertigo  exercises - how and why to perform - when to stop - answered all questions about exercises 10 minutes, self massage with vibration gun to left hip - tolerated well, 5 minutes hip abd/add slide with feet on slides 5 minutes   S/l:    Standing:   Neuromuscular Re-education:   Manual Therapy:  Therapeutic Activity:STS with focus on pressing floor away like leg press for improved STS and glute activation - 2x12 slow and controlled- elevated surface   Gait Training: walking with a purpose with improved glute and calf activation- practiced fast pace walking and then slower gait speed  10 minutes Trigger Point Dry Needling:  Modalities:  PATIENT EDUCATION:  Education details: on HEP Person educated: Patient Education method: Programmer, multimedia, Facilities manager, and Handouts Education comprehension: verbalized understanding   HOME EXERCISE PROGRAM: [id number] Long exhale breathing in hook lying  ASSESSMENT:  CLINICAL IMPRESSION: 05/05/2024 Session focused on pain management and proper muscle activation with functional tasks and walking. Tolerated well. Slight cramping noted in left hip during middle of session. This resolved after use of percussion gun to the area. Added purposeful walking and STS with focus on glute activation to HEP. No pain noted end of session. Will continue with current POC as tolerated.   EVAL: Patient presents to physical therapy with complaints of chronic low back pain with recent episode of increased acute low back pain about a month ago.  Symptoms presented worse than previously with them radiating all the way down to lower leg.  Patient presents with strength deficits as well as movement dysfunctions and overall postural deficits that are likely contributing to current presentation.  Patient has had a lumbar fusion about 20 years ago for unknown levels which may contribute to overall prognosis.  Educated patient and current findings as well as plan moving forward.  Patient  would greatly benefit from skilled PT to address physical impairments and return her to optimal function.  OBJECTIVE IMPAIRMENTS: decreased activity tolerance, decreased mobility, difficulty walking, decreased ROM, decreased strength, improper body mechanics, postural dysfunction, and pain.   ACTIVITY LIMITATIONS: lifting, bending, standing, and locomotion level  PARTICIPATION LIMITATIONS: occupation  PERSONAL FACTORS: Fitness, Time since onset of injury/illness/exacerbation, and 1-2 comorbidities: B THA, lumbar fusion are also affecting patient's functional outcome.   REHAB POTENTIAL: Good  CLINICAL DECISION MAKING: Stable/uncomplicated  EVALUATION COMPLEXITY: Low   GOALS: Goals reviewed with patient? yes  SHORT TERM GOALS: Target date: 05/20/2024   Patient will be independent in self management strategies to improve quality of life and functional outcomes. Baseline: New Program Goal status: INITIAL  2.  Patient will report at least 50% improvement in overall symptoms and/or function to demonstrate improved functional mobility Baseline: 0% better Goal status: INITIAL  3.  Patient will demonstrate long exhale breathing with improved core activation to demonstrate improved core function Baseline: Unable Goal status: INITIAL    LONG TERM GOALS: Target date: 07/01/2024    Patient will report at least 75% improvement in overall symptoms and/or function to demonstrate improved functional mobility Baseline: 0% better Goal status: INITIAL  2.  Patient will score at least 2 points higher on PSFS average to demonstrate change in overall function. Baseline: see above Goal status: INITIAL  3.  Patient will report reduction in radicular symptoms by at least 50% to demonstrate improved quality of life Baseline: 0% Goal status: INITIAL   PLAN:  PT FREQUENCY: 1-2x/week for a total of 16 visits over 12 week certification period  PT DURATION: 12 weeks  PLANNED INTERVENTIONS:  97110-Therapeutic exercises, 97530- Therapeutic activity, 97112- Neuromuscular re-education, 97535- Self Care, 98119- Manual therapy, [phone]- Gait training, [phone]- Orthotic Fit/training, [phone]- Canalith repositioning, [id number]- Aquatic Therapy, [id number]- Electrical stimulation (unattended), [phone]- Ionotophoresis 4mg /ml Dexamethasone , Patient/Family education, Balance training, Stair training, Taping, Dry Needling, Joint mobilization, Joint manipulation, Spinal manipulation, Spinal mobilization, Cryotherapy, and Moist heat   PLAN FOR NEXT SESSION: goddess pose, Prone interventions, strength and stability interventions, breathing exercises for posture and body awareness   11:01 AM, 05/05/24 Tabitha Ewings, DPT Physical Therapy with [place]

## 2024-05-10 ENCOUNTER — Encounter: Admitting: Physical Therapy

## 2024-05-10 DIAGNOSIS — I10 Essential (primary) hypertension: Secondary | ICD-10-CM | POA: Diagnosis not present

## 2024-05-10 DIAGNOSIS — G4733 Obstructive sleep apnea (adult) (pediatric): Secondary | ICD-10-CM | POA: Diagnosis not present

## 2024-05-12 ENCOUNTER — Encounter: Admitting: Physical Therapy

## 2024-05-13 ENCOUNTER — Encounter: Payer: Self-pay | Admitting: Physical Therapy

## 2024-05-13 ENCOUNTER — Ambulatory Visit: Admitting: Physical Therapy

## 2024-05-13 DIAGNOSIS — M5459 Other low back pain: Secondary | ICD-10-CM | POA: Diagnosis not present

## 2024-05-13 DIAGNOSIS — M6281 Muscle weakness (generalized): Secondary | ICD-10-CM | POA: Diagnosis not present

## 2024-05-13 DIAGNOSIS — M25552 Pain in left hip: Secondary | ICD-10-CM

## 2024-05-13 NOTE — Therapy (Signed)
 OUTPATIENT PHYSICAL THERAPY THORACOLUMBAR TREATMENT  PHYSICAL THERAPY DISCHARGE SUMMARY  Visits from Start of Care: 5  Current functional level related to goals / functional outcomes: See below   Remaining deficits: See below   Education / Equipment: See below   Patient agrees to discharge. Patient goals were partially met. Patient is being discharged due to the patient's request. Due to schedule.  Patient Name: Veronica Finley MRN: [id number] DOB:1951-02-16, 73 y.o., female Today's Date: 05/13/2024  END OF SESSION:  PT End of Session - 05/13/24 1102     Visit Number 5    Number of Visits 16    Date for PT Re-Evaluation 07/01/24    Authorization Type humana- 12 visits approved 5/22 to 7/11    Authorization - Visit Number 5    Authorization - Number of Visits 12    Progress Note Due on Visit 10    PT Start Time 1102    PT Stop Time 1125    PT Time Calculation (min) 23 min    Activity Tolerance Patient tolerated treatment well    Behavior During Therapy [facility] for tasks assessed/performed          Past Medical History:  Diagnosis Date   Anemia    Arthritis    Cancer (HCC)    COVID-19 08/28/2020   Depression    Family history of adverse reaction to anesthesia    mother and daughter have severe PONV   Hyperlipidemia    Hypertension    Hypothyroidism    Lumbar spinal stenosis 07/08/2013   OSA on CPAP 10/26/2018   Pre-diabetes    Renal mass 09/2020   Past Surgical History:  Procedure Laterality Date   GASTRIC BYPASS     KNEE ARTHROSCOPY     ROBOTIC ASSITED PARTIAL NEPHRECTOMY Left 09/22/2020   Procedure: XI ROBOTIC ASSITED LAPAROSCOPIC  TOTAL NEPHRECTOMY; RESECTION OF ABDOMINAL MASS;  Surgeon: Devere Lonni Righter, MD;  Location: [facility];  Service: Urology;  Laterality: Left;   SPINE SURGERY     TONSILLECTOMY     TOTAL HIP ARTHROPLASTY Left 02/09/2021   Procedure: LEFT TOTAL HIP ARTHROPLASTY ANTERIOR APPROACH;  Surgeon: Vernetta Lonni GRADE, MD;   Location: [facility];  Service: Orthopedics;  Laterality: Left;   TOTAL HIP ARTHROPLASTY Right 05/11/2021   Procedure: RIGHT TOTAL HIP ARTHROPLASTY ANTERIOR APPROACH;  Surgeon: Vernetta Lonni GRADE, MD;  Location: [facility];  Service: Orthopedics;  Laterality: Right;   TOTAL KNEE ARTHROPLASTY Right 04/02/2019   Procedure: TOTAL KNEE ARTHROPLASTY;  Surgeon: Gerome Charleston, MD;  Location: [facility];  Service: Orthopedics;  Laterality: Right;  spinal plus adductor canal   TOTAL KNEE ARTHROPLASTY Left 08/20/2019   Procedure: TOTAL KNEE ARTHROPLASTY;  Surgeon: Gerome Charleston, MD;  Location: [facility];  Service: Orthopedics;  Laterality: Left;  with adductor canal   TUBAL LIGATION     UVULECTOMY     VENTRAL HERNIA REPAIR N/A 11/23/2020   Procedure: VENTRAL HERNIA REPAIR WITH MESH;  Surgeon: Vernetta Berg, MD;  Location: [facility];  Service: General;  Laterality: N/A;  LMA   Patient Active Problem List   Diagnosis Date Noted   Osteoporosis screening 01/08/2024   Prediabetes 01/21/2023   Morbid obesity (HCC) 07/17/2022   Chronic kidney disease, stage 3b (HCC) 06/28/2021   Status post total replacement of right hip 05/11/2021   Status post total replacement of left hip 02/09/2021   Renal cell carcinoma [facility]) - s/p left nephrectomy 12/14/2020   Status post nephrectomy 10/24/2020  Vitamin B12 deficiency 06/02/2020   S/P knee replacement 04/02/2019   Contact dermatitis due to chemicals 01/25/2019   OSA on CPAP 10/26/2018   Generalized hyperhidrosis 07/15/2016   Hx of laparoscopic gastric banding+ truncal vagotomy 03/24/2006 01/10/2014   Spinal stenosis of lumbar region 07/08/2013   Major depression, recurrent, chronic (HCC) 07/08/2013   Essential hypertension 04/06/2012   Acquired hypothyroidism 10/08/2011   Mixed hyperlipidemia 10/08/2011   AR (allergic rhinitis) 12/20/2010   Restless leg syndrome 12/20/2010   Osteoarthritis of multiple joints 12/17/2010    PCP:  Jodie Lavern CROME,  MD  REFERRING PROVIDER:  Jodie Lavern CROME, MD  REFERRING DIAG: M54.16 (ICD-10-CM) - Lumbar back pain with radiculopathy affecting left lower extremity  Rationale for Evaluation and Treatment: Rehabilitation  THERAPY DIAG:  Other low back pain  Muscle weakness (generalized)  Pain in left hip  ONSET DATE: back - years   SUBJECTIVE:                                                                                                                                                                                           SUBJECTIVE STATEMENT: 05/13/2024 Back is better but the left knee is  bothering her a bit. Vertigo is touch and go. States she had slight dizziness after standing up too quickly. Reports she feels 50% better in her back. States her hip still hurts. Wants today to be her last session due to her schedule. Reports her back radicular symptoms are much better at least 50%.  EVAL:Recently patient has had in increase in back pain about a month ago. States that her left hip and back was hurting going down into her leg to her lower leg.  States she went on prednisone  and it has helped. States that her radicular symptoms are a little better now.   Hip bursitis is tender to the touch along the left side. States she slipped the other day, didn't fall but feels like she hurt her hip a little bit more.   Very active no current workout routine.  Wants to get stronger and maintain her mobility     PERTINENT HISTORY:  CKD, left kidney removal due to cancer,  R TKA 2020, L TKA 2020, anterior approaches Left THA 2022, Right THA 2022, spine surgery fusion 20 years ago unknown levels  PAIN:  Are you having pain? Yes: NPRS scale: 8/10 Pain location: left knee Pain description: achy Aggravating factors: walking  Relieving factors: ice, prednisone ,   PRECAUTIONS: None  RED FLAGS: None   WEIGHT BEARING RESTRICTIONS: No  FALLS:  Has patient fallen in last 6 months? No  OCCUPATION:  part time - stands on her feet - - [employer]  PLOF: Independent  PATIENT GOALS: to get stronger - core needs to be stronger     OBJECTIVE:  Note: Objective measures were completed at Evaluation unless otherwise noted.  DIAGNOSTIC FINDINGS:  No recent lumbar imaging  PATIENT SURVEYS:  Patient-specific activity functional scoring scheme (Point to one number):  0 represents "unable to perform." 10 represents "able to perform at prior level. 0 1 2 3 4 5 6 7 8 9  10 (Date and Score) Activity Initial  Activity Eval  6/26   Walk a 1/2 mile 2  2   Standing for 7 hours   5  6  Lifting a sewing machine (30#) 5 5   Additional Additional Total score = sum of the activity scores/number of activities Minimum detectable change (90%CI) for average score = 2 points Minimum detectable change (90%CI) for single activity score = 3 points PSFS developed by: Rosalee MYRTIS Marvis KYM Charlet CHRISTELLA., & Binkley, J. (1995). Assessing disability and change on individual  patients: a report of a patient specific measure. Physiotherapy [place], 47, [phone]. Reproduced with the permission of the authors  Score: EAVL:  12/3=4 6/26: 13/3/4.33   COGNITION: Overall cognitive status: Within functional limits for tasks assessed     SENSATION: Not tested   POSTURE: rounded shoulders and forward head  PALPATION: Tenderness to palpation along left lumbar paraspinals and bilateral glutes and lateral legs  LUMBAR ROM:   AROM 6/26  Flexion 50% limited  Extension 50% limited  Right lateral flexion 25% limited  Left lateral flexion 25% limited  Right rotation   Left rotation    (Blank rows = not tested)   LE Measurements Lower Extremity Right 6/26 Left 6/26   A/PROM MMT A/PROM MMT  Hip Flexion WFL 4+ WFL 4+  Hip Extension WFL 4 WFL 4-  Hip Abduction      Hip Adduction      Hip Internal rotation 60  60   Hip External rotation 45  45   Knee Flexion  4+  4+  Knee Extension  5  5   Ankle Dorsiflexion  5  5  Ankle Plantarflexion      Ankle Inversion      Ankle Eversion       (Blank rows = not tested)    LUMBAR SPECIAL TESTS:  slump neg B SLR neg B    GAIT: Distance walked: 25 feet within clinic Assistive device utilized: None Level of assistance: Complete Independence Comments: wide BOS limited hip ROM  TREATMENT DATE:                                                                                                                               05/13/2024  Therapeutic Exercise:  Review of HEP, review of medbridge and how to use, objective measurements updated.  Supine:  Seated:    S/l:    Standing:  Neuromuscular Re-education:   Manual Therapy:  Therapeutic Activity:   Gait Training:   Trigger Point Dry Needling:  Modalities:     PATIENT EDUCATION:  Education details: on HEP Person educated: Patient Education method: Programmer, multimedia, Facilities manager, and Handouts Education comprehension: verbalized understanding   HOME EXERCISE PROGRAM: [id number] Long exhale breathing in hook lying  ASSESSMENT:  CLINICAL IMPRESSION: 05/13/2024 All short term goals met at this time and 2/3 long term goals met. Patient with busy upcoming schedule and would like today to be last session. Answered all questions and reviewed HEP. Patient to DC from PT to HEP secondary to patient request.  EVAL: Patient presents to physical therapy with complaints of chronic low back pain with recent episode of increased acute low back pain about a month ago.  Symptoms presented worse than previously with them radiating all the way down to lower leg.  Patient presents with strength deficits as well as movement dysfunctions and overall postural deficits that are likely contributing to current presentation.  Patient has had a lumbar fusion about 20 years ago for unknown levels which may contribute to overall prognosis.  Educated patient and current findings as well as plan moving forward.   Patient would greatly benefit from skilled PT to address physical impairments and return her to optimal function.  OBJECTIVE IMPAIRMENTS: decreased activity tolerance, decreased mobility, difficulty walking, decreased ROM, decreased strength, improper body mechanics, postural dysfunction, and pain.   ACTIVITY LIMITATIONS: lifting, bending, standing, and locomotion level  PARTICIPATION LIMITATIONS: occupation  PERSONAL FACTORS: Fitness, Time since onset of injury/illness/exacerbation, and 1-2 comorbidities: B THA, lumbar fusion are also affecting patient's functional outcome.   REHAB POTENTIAL: Good  CLINICAL DECISION MAKING: Stable/uncomplicated  EVALUATION COMPLEXITY: Low   GOALS: Goals reviewed with patient? yes  SHORT TERM GOALS: Target date: 05/20/2024   Patient will be independent in self management strategies to improve quality of life and functional outcomes. Baseline: New Program Goal status: MET  2.  Patient will report at least 50% improvement in overall symptoms and/or function to demonstrate improved functional mobility Baseline: 0% better Goal status: MET  3.  Patient will demonstrate long exhale breathing with improved core activation to demonstrate improved core function Baseline: Unable Goal status: MET    LONG TERM GOALS: Target date: 07/01/2024    Patient will report at least 75% improvement in overall symptoms and/or function to demonstrate improved functional mobility Baseline: 0% better Goal status: PROGRESSING  2.  Patient will score at least 2 points higher on PSFS average to demonstrate change in overall function. Baseline: see above Goal status: PROGRESSING  3.  Patient will report reduction in radicular symptoms by at least 50% to demonstrate improved quality of life Baseline: 0% Goal status: MET   PLAN:  PT FREQUENCY: 1-2x/week for a total of 16 visits over 12 week certification period  PT DURATION: 12 weeks  PLANNED INTERVENTIONS:  97110-Therapeutic exercises, 97530- Therapeutic activity, 97112- Neuromuscular re-education, 97535- Self Care, [id number]- Manual therapy, [phone]- Gait training, [phone]- Orthotic Fit/training, [phone]- Canalith repositioning, [id number]- Aquatic Therapy, [id number]- Electrical stimulation (unattended), [phone]- Ionotophoresis 4mg /ml Dexamethasone , Patient/Family education, Balance training, Stair training, Taping, Dry Needling, Joint mobilization, Joint manipulation, Spinal manipulation, Spinal mobilization, Cryotherapy, and Moist heat   PLAN FOR NEXT SESSION:DC to HEP   11:27 AM, 05/13/24 Olivia Church, DPT Physical Therapy with [facility]

## 2024-05-17 ENCOUNTER — Encounter: Admitting: Physical Therapy

## 2024-05-19 ENCOUNTER — Encounter: Admitting: Physical Therapy

## 2024-05-31 ENCOUNTER — Other Ambulatory Visit: Payer: Self-pay | Admitting: Family Medicine

## 2024-06-03 ENCOUNTER — Encounter: Payer: Self-pay | Admitting: Family Medicine

## 2024-06-03 ENCOUNTER — Ambulatory Visit: Admitting: Family Medicine

## 2024-06-03 VITALS — BP 94/56 | HR 91 | Temp 98.4°F | Ht 66.0 in | Wt 227.2 lb

## 2024-06-03 DIAGNOSIS — U071 COVID-19: Secondary | ICD-10-CM

## 2024-06-03 DIAGNOSIS — G4733 Obstructive sleep apnea (adult) (pediatric): Secondary | ICD-10-CM | POA: Diagnosis not present

## 2024-06-03 DIAGNOSIS — I10 Essential (primary) hypertension: Secondary | ICD-10-CM | POA: Diagnosis not present

## 2024-06-03 DIAGNOSIS — R051 Acute cough: Secondary | ICD-10-CM | POA: Diagnosis not present

## 2024-06-03 LAB — POC COVID19 BINAXNOW: SARS Coronavirus 2 Ag: POSITIVE — AB

## 2024-06-03 MED ORDER — NIRMATRELVIR/RITONAVIR (PAXLOVID)TABLET: 3.0000 | ORAL_TABLET | Freq: Two times a day (BID) | ORAL | 0 refills | Status: AC

## 2024-06-03 MED ORDER — DILTIAZEM HCL ER COATED BEADS 120 MG PO CP24: 120.0000 mg | ORAL_CAPSULE | Freq: Every day | ORAL | 3 refills | Status: AC

## 2024-06-03 MED ORDER — ZEPBOUND 5 MG/0.5ML ~~LOC~~ SOAJ: 5.0000 mg | SUBCUTANEOUS | 2 refills | Status: DC

## 2024-06-03 NOTE — Progress Notes (Signed)
 Subjective  CC:  Chief Complaint  Patient presents with   Cough    Pt stated that she has been coughing for the past 2 days with drainage, and ears stopped up from the cold.    HPI: Veronica Finley is a 73 y.o. female who presents to the office today to address the problems listed above in the chief complaint. Discussed the use of AI scribe software for clinical note transcription with the patient, who gave verbal consent to proceed.  History of Present Illness Veronica Finley is a 73 year old female who presents with 2 days of cold sxs.  Veronica Finley reports 2 days of cold symptoms, including runny nose, headache significant fatigue fever to 101.8 yesterday and bodyaches.  No shortness of breath or pleuritic chest pain.  No nausea vomiting or diarrhea. No fever, cough, or shortness of breath.  Obesity: On Zepbound  2.5 mg for the last 3 months or so.  Weight is down about 15 pounds.  No significant side effects.  Doing well with healthy diet.  Hypertension on Hyzaar and Cardizem : Now with symptoms of hypotension.  Describes lightheadedness.  Blood pressure is low in the office today.  No palpitations or chest pain.  She mentions that her blood pressure has been low, which she attributes to weight loss. She has been losing weight and is currently on Zepbound  at a dose of 25 mg, noting that she is on the lowest dose of this medication.   Assessment  1. COVID-19   2. Acute cough   3. Essential hypertension   4. Morbid obesity (HCC)   5. OSA on CPAP      Plan  Assessment and Plan Assessment & Plan COVID-19 Tested positive for COVID-19 with typical symptoms. Possible occupational exposure. - Advised isolation for 5 days. - Symptomatic treatment with acetaminophen  and fluids. - Monitor symptoms and seek care if worsening. - paxlovid   Hypotension, on BP meds and now with significant weight loss, having low bp - Monitor blood pressure regularly. - Increase fluid and  salt intake. - decrease diltiazem  from 240 to 120mg  daily. Continue hyzaar and monitor.   Weight loss Significant weight loss likely due to Zepbound  2.5 mg. - increase to 5mg  and continue with healthy high protein diet    Follow up: 08/27/2024  Orders Placed This Encounter  Procedures   POC COVID-19   Meds ordered this encounter  Medications   tirzepatide  (ZEPBOUND ) 5 MG/0.5ML Pen    Sig: Inject 5 mg into the skin once a week.    Dispense:  2 mL    Refill:  2   nirmatrelvir /ritonavir  (PAXLOVID ) 20 x 150 MG & 10 x 100MG  TABS    Sig: Take 3 tablets by mouth 2 (two) times daily for 5 days. (Take nirmatrelvir  150 mg two tablets twice daily for 5 days and ritonavir  100 mg one tablet twice daily for 5 days) Patient GFR is 45    Dispense:  30 tablet    Refill:  0   diltiazem  (CARDIZEM  CD) 120 MG 24 hr capsule    Sig: Take 1 capsule (120 mg total) by mouth daily.    Dispense:  90 capsule    Refill:  3    Please discontinue the 240mg  dose.     I reviewed the patients updated PMH, FH, and SocHx.  Patient Active Problem List   Diagnosis Date Noted   Prediabetes 01/21/2023    Priority: High   Morbid obesity (HCC)  07/17/2022    Priority: High   Chronic kidney disease, stage 3b (HCC) 06/28/2021    Priority: High   Renal cell carcinoma (HCC) - s/p left nephrectomy 12/14/2020    Priority: High   Status post nephrectomy 10/24/2020    Priority: High   OSA on CPAP 10/26/2018    Priority: High   Hx of laparoscopic gastric banding+ truncal vagotomy 03/24/2006 01/10/2014    Priority: High   Spinal stenosis of lumbar region 07/08/2013    Priority: High   Major depression, recurrent, chronic (HCC) 07/08/2013    Priority: High   Essential hypertension 04/06/2012    Priority: High   Acquired hypothyroidism 10/08/2011    Priority: High   Mixed hyperlipidemia 10/08/2011    Priority: High   S/P knee replacement 04/02/2019    Priority: Medium    Restless leg syndrome 12/20/2010     Priority: Medium    Osteoarthritis of multiple joints 12/17/2010    Priority: Medium    Osteoporosis screening 01/08/2024    Priority: Low   Status post total replacement of right hip 05/11/2021    Priority: Low   Status post total replacement of left hip 02/09/2021    Priority: Low   Vitamin B12 deficiency 06/02/2020    Priority: Low   Contact dermatitis due to chemicals 01/25/2019    Priority: Low   Generalized hyperhidrosis 07/15/2016    Priority: Low   AR (allergic rhinitis) 12/20/2010    Priority: Low   Current Meds  Medication Sig   acetaminophen  (TYLENOL ) 500 MG tablet Take 1,000 mg by mouth every 6 (six) hours as needed for moderate pain.   cholecalciferol  (VITAMIN D3) 25 MCG (1000 UNIT) tablet Take 1,000 Units by mouth daily.   FLUoxetine  (PROZAC ) 20 MG capsule TAKE 1 CAPSULE EVERY DAY (NEED MD APPOINTMENT)   levothyroxine  (SYNTHROID ) 150 MCG tablet Take 1 tablet (150 mcg total) by mouth daily.   losartan -hydrochlorothiazide  (HYZAAR) 100-25 MG tablet TAKE 1 TABLET EVERY DAY (NEED MD APPOINTMENT)   nirmatrelvir /ritonavir  (PAXLOVID ) 20 x 150 MG & 10 x 100MG  TABS Take 3 tablets by mouth 2 (two) times daily for 5 days. (Take nirmatrelvir  150 mg two tablets twice daily for 5 days and ritonavir  100 mg one tablet twice daily for 5 days) Patient GFR is 45   rOPINIRole  (REQUIP  XL) 4 MG 24 hr tablet TAKE 1 TABLET AT BEDTIME   rosuvastatin  (CRESTOR ) 10 MG tablet TAKE 1 TABLET AT BEDTIME   tirzepatide  (ZEPBOUND ) 5 MG/0.5ML Pen Inject 5 mg into the skin once a week.   vitamin B-12 (CYANOCOBALAMIN ) 1000 MCG tablet Take 1,000 mcg by mouth daily.   vitamin C (ASCORBIC ACID ) 500 MG tablet Take 500 mg by mouth 3 (three) times a week.   [DISCONTINUED] diltiazem  (CARDIZEM  CD) 240 MG 24 hr capsule TAKE 1 CAPSULE EVERY DAY   [DISCONTINUED] predniSONE  (DELTASONE ) 10 MG tablet Take 4 tabs qd x 2 days, 3 qd x 2 days, 2 qd x 2d, 1qd x 3 days   [DISCONTINUED] tirzepatide  (ZEPBOUND ) 2.5 MG/0.5ML Pen  Inject 2.5 mg into the skin once a week.   Allergies: Patient is allergic to thimerosal and thimerosal (thiomersal). Family History: Patient family history includes Arthritis in her brother, mother, and sister; Atrial fibrillation in her mother; Diabetes in her brother; Heart disease in her father; Hyperlipidemia in her brother and mother; Hypertension in her mother; Leukemia in her father. Social History:  Patient  reports that she has never smoked. She has never used smokeless  tobacco. She reports current alcohol use. She reports that she does not use drugs.  Review of Systems: Constitutional: Negative for fever malaise or anorexia Cardiovascular: negative for chest pain Respiratory: negative for SOB or persistent cough Gastrointestinal: negative for abdominal pain  Objective  Vitals: BP (!) 94/56   Pulse 91   Temp 98.4 F (36.9 C)   Ht 5' 6 (1.676 m)   Wt 227 lb 3.2 oz (103.1 kg)   SpO2 96%   BMI 36.67 kg/m  General: no acute distress , A&Ox3, no respiratory distress HEENT: PEERL, conjunctiva normal, neck is supple Cardiovascular:  RRR without murmur or gallop.  Respiratory:  Good breath sounds bilaterally, CTAB with normal respiratory effort  Office Visit on 06/03/2024  Component Date Value Ref Range Status   SARS Coronavirus 2 Ag 06/03/2024 Positive (A)  Negative Final    Commons side effects, risks, benefits, and alternatives for medications and treatment plan prescribed today were discussed, and the patient expressed understanding of the given instructions. Patient is instructed to call or message via MyChart if he/she has any questions or concerns regarding our treatment plan. No barriers to understanding were identified. We discussed Red Flag symptoms and signs in detail. Patient expressed understanding regarding what to do in case of urgent or emergency type symptoms.  Medication list was reconciled, printed and provided to the patient in AVS. Patient instructions and  summary information was reviewed with the patient as documented in the AVS. This note was prepared with assistance of Dragon voice recognition software. Occasional wrong-word or sound-a-like substitutions may have occurred due to the inherent limitations of voice recognition software

## 2024-06-07 ENCOUNTER — Other Ambulatory Visit: Payer: Self-pay | Admitting: Family

## 2024-06-07 ENCOUNTER — Other Ambulatory Visit: Payer: Self-pay | Admitting: Family Medicine

## 2024-06-07 MED ORDER — ZEPBOUND 5 MG/0.5ML ~~LOC~~ SOAJ: 5.0000 mg | SUBCUTANEOUS | 0 refills | Status: DC

## 2024-06-09 DIAGNOSIS — G4733 Obstructive sleep apnea (adult) (pediatric): Secondary | ICD-10-CM | POA: Diagnosis not present

## 2024-06-09 DIAGNOSIS — I10 Essential (primary) hypertension: Secondary | ICD-10-CM | POA: Diagnosis not present

## 2024-07-10 DIAGNOSIS — G4733 Obstructive sleep apnea (adult) (pediatric): Secondary | ICD-10-CM | POA: Diagnosis not present

## 2024-07-10 DIAGNOSIS — I10 Essential (primary) hypertension: Secondary | ICD-10-CM | POA: Diagnosis not present

## 2024-08-09 ENCOUNTER — Encounter: Payer: Self-pay | Admitting: Family Medicine

## 2024-08-09 ENCOUNTER — Ambulatory Visit: Admitting: Family Medicine

## 2024-08-09 VITALS — BP 95/60 | HR 87 | Temp 97.7°F | Ht 66.0 in | Wt 209.4 lb

## 2024-08-09 DIAGNOSIS — R7303 Prediabetes: Secondary | ICD-10-CM

## 2024-08-09 DIAGNOSIS — Z6833 Body mass index (BMI) 33.0-33.9, adult: Secondary | ICD-10-CM | POA: Diagnosis not present

## 2024-08-09 DIAGNOSIS — G4733 Obstructive sleep apnea (adult) (pediatric): Secondary | ICD-10-CM | POA: Diagnosis not present

## 2024-08-09 DIAGNOSIS — I10 Essential (primary) hypertension: Secondary | ICD-10-CM | POA: Diagnosis not present

## 2024-08-09 DIAGNOSIS — Z23 Encounter for immunization: Secondary | ICD-10-CM | POA: Diagnosis not present

## 2024-08-09 MED ORDER — TIRZEPATIDE-WEIGHT MANAGEMENT 7.5 MG/0.5ML ~~LOC~~ SOLN: 7.5000 mg | SUBCUTANEOUS | 1 refills | Status: AC

## 2024-08-09 MED ORDER — LOSARTAN POTASSIUM-HCTZ 50-12.5 MG PO TABS: 1.0000 | ORAL_TABLET | Freq: Every day | ORAL | 3 refills | Status: AC

## 2024-08-09 NOTE — Progress Notes (Signed)
 Subjective  CC:  Chief Complaint  Patient presents with   Weight Loss    Wants wt medication changed     HPI: Veronica Finley is a 73 y.o. female who presents to the office today to address the problems listed above in the chief complaint. Discussed the use of AI scribe software for clinical note transcription with the patient, who gave verbal consent to proceed.  History of Present Illness Veronica Finley is a 73 year old female who presents for a follow-up visit regarding her weight loss medication, Zepbound .  She started Zepbound  in May and is currently in her fourth month of treatment. She has lost 31 pounds since starting the medication, going from 240 pounds to 209 pounds. Initially, she weighed 250 pounds in December. She experienced a weight plateau but notes recent continued weight loss.  She experiences occasional constipation as a side effect of Zepbound , which she manages with Miralax . No nausea, significant pain, fatigue, or frequent headaches, though she has had a couple of headaches attributed to other causes.  She is currently using Zepbound  and has two pens left, indicating she is in the second week of her fourth month of treatment. She is active at her job, which involves a lot of walking. She is planning a trip to [place] [facility], which will involve extensive walking.  BP is low; occ lightheadedness on ccb and arb/hydrochlorothiazide     Assessment  1. Need for influenza vaccination   2. Morbid obesity (HCC)   3. Prediabetes   4. Essential hypertension      Plan  Assessment and Plan Assessment & Plan Obesity Significant weight loss of 31 pounds over four months with Zetvan. Minimal side effects, primarily constipation. Rapid weight loss raises concerns about muscle mass loss, hair thinning, skin changes, and gallbladder issues. Goal is to maintain healthy weight loss pace. - Continue zepbound  at current dose for two weeks, then increase to 7.5 mg. -  Send 90-day supply of new dose. - Monitor weight loss to not exceed 10 pounds per month. - Encourage adequate protein intake and resistance training.  Constipation Intermittent constipation due to Zetvan, managed with Miralax . - Continue Miralax  as needed.  HTN: now overtreated. Cut back on hyzaar 50/12.5 and continue ccb. Recheck next month at [facility]  Prediabetes: should be resolved. Will check with labs next month.    Follow up: as scheduled for [facility] Orders Placed This Encounter  Procedures   Flu vaccine HIGH DOSE PF(Fluzone Trivalent)   Meds ordered this encounter  Medications   losartan -hydrochlorothiazide  (HYZAAR) 50-12.5 MG tablet    Sig: Take 1 tablet by mouth daily.    Dispense:  90 tablet    Refill:  3   tirzepatide  7.5 MG/0.5ML injection vial    Sig: Inject 7.5 mg into the skin once a week.    Dispense:  6 mL    Refill:  1     I reviewed the patients updated PMH, FH, and SocHx.  Patient Active Problem List   Diagnosis Date Noted   Prediabetes 01/21/2023    Priority: High   Morbid obesity (HCC) 07/17/2022    Priority: High   Chronic kidney disease, stage 3b (HCC) 06/28/2021    Priority: High   Renal cell carcinoma (HCC) - s/p left nephrectomy 12/14/2020    Priority: High   Status post nephrectomy 10/24/2020    Priority: High   OSA on CPAP 10/26/2018    Priority: High   Hx of laparoscopic gastric banding+  truncal vagotomy 03/24/2006 01/10/2014    Priority: High   Spinal stenosis of lumbar region 07/08/2013    Priority: High   Major depression, recurrent, chronic (HCC) 07/08/2013    Priority: High   Essential hypertension 04/06/2012    Priority: High   Acquired hypothyroidism 10/08/2011    Priority: High   Mixed hyperlipidemia 10/08/2011    Priority: High   S/P knee replacement 04/02/2019    Priority: Medium    Restless leg syndrome 12/20/2010    Priority: Medium    Osteoarthritis of multiple joints 12/17/2010    Priority: Medium    Osteoporosis  screening 01/08/2024    Priority: Low   Status post total replacement of right hip 05/11/2021    Priority: Low   Status post total replacement of left hip 02/09/2021    Priority: Low   Vitamin B12 deficiency 06/02/2020    Priority: Low   Contact dermatitis due to chemicals 01/25/2019    Priority: Low   Generalized hyperhidrosis 07/15/2016    Priority: Low   AR (allergic rhinitis) 12/20/2010    Priority: Low   Current Meds  Medication Sig   acetaminophen  (TYLENOL ) 500 MG tablet Take 1,000 mg by mouth every 6 (six) hours as needed for moderate pain.   cholecalciferol  (VITAMIN D3) 25 MCG (1000 UNIT) tablet Take 1,000 Units by mouth daily.   diltiazem  (CARDIZEM  CD) 120 MG 24 hr capsule Take 1 capsule (120 mg total) by mouth daily.   FLUoxetine  (PROZAC ) 20 MG capsule TAKE 1 CAPSULE EVERY DAY (NEED MD APPOINTMENT)   levothyroxine  (SYNTHROID ) 150 MCG tablet Take 1 tablet (150 mcg total) by mouth daily.   losartan -hydrochlorothiazide  (HYZAAR) 50-12.5 MG tablet Take 1 tablet by mouth daily.   rOPINIRole  (REQUIP  XL) 4 MG 24 hr tablet TAKE 1 TABLET AT BEDTIME   rosuvastatin  (CRESTOR ) 10 MG tablet TAKE 1 TABLET AT BEDTIME   tirzepatide  7.5 MG/0.5ML injection vial Inject 7.5 mg into the skin once a week.   vitamin B-12 (CYANOCOBALAMIN ) 1000 MCG tablet Take 1,000 mcg by mouth daily.   vitamin C (ASCORBIC ACID ) 500 MG tablet Take 500 mg by mouth 3 (three) times a week.   [DISCONTINUED] losartan -hydrochlorothiazide  (HYZAAR) 100-25 MG tablet TAKE 1 TABLET EVERY DAY (NEED MD APPOINTMENT)   [DISCONTINUED] tirzepatide  (ZEPBOUND ) 5 MG/0.5ML Pen Inject 5 mg into the skin once a week.   Allergies: Patient is allergic to thimerosal and thimerosal (thiomersal). Family History: Patient family history includes Arthritis in her brother, mother, and sister; Atrial fibrillation in her mother; Diabetes in her brother; Heart disease in her father; Hyperlipidemia in her brother and mother; Hypertension in her  mother; Leukemia in her father. Social History:  Patient  reports that she has never smoked. She has never used smokeless tobacco. She reports current alcohol use. She reports that she does not use drugs.  Review of Systems: Constitutional: Negative for fever malaise or anorexia Cardiovascular: negative for chest pain Respiratory: negative for SOB or persistent cough Gastrointestinal: negative for abdominal pain  Objective  Vitals: BP 95/60   Pulse 87   Temp 97.7 F (36.5 C)   Ht 5' 6 (1.676 m)   Wt 209 lb 6.4 oz (95 kg)   SpO2 92%   BMI 33.80 kg/m  General: no acute distress , A&Ox3 HEENT: PEERL, conjunctiva normal, neck is supple Cardiovascular:  RRR without murmur or gallop.  Respiratory:  Good breath sounds bilaterally, CTAB with normal respiratory effort Skin:  Warm, no rashes Commons side effects, risks, benefits,  and alternatives for medications and treatment plan prescribed today were discussed, and the patient expressed understanding of the given instructions. Patient is instructed to call or message via MyChart if he/she has any questions or concerns regarding our treatment plan. No barriers to understanding were identified. We discussed Red Flag symptoms and signs in detail. Patient expressed understanding regarding what to do in case of urgent or emergency type symptoms.  Medication list was reconciled, printed and provided to the patient in AVS. Patient instructions and summary information was reviewed with the patient as documented in the AVS. This note was prepared with assistance of Dragon voice recognition software. Occasional wrong-word or sound-a-like substitutions may have occurred due to the inherent limitations of voice recognition software

## 2024-08-09 NOTE — Patient Instructions (Signed)
 Please follow up as scheduled for your next visit with me: 08/27/2024   If you have any questions or concerns, please don't hesitate to send me a message via MyChart or call the office at [phone]. Thank you for visiting with us  today! It's our pleasure caring for you.

## 2024-08-10 DIAGNOSIS — G4733 Obstructive sleep apnea (adult) (pediatric): Secondary | ICD-10-CM | POA: Diagnosis not present

## 2024-08-10 DIAGNOSIS — I10 Essential (primary) hypertension: Secondary | ICD-10-CM | POA: Diagnosis not present

## 2024-08-27 ENCOUNTER — Ambulatory Visit: Payer: Medicare PPO | Admitting: Family Medicine

## 2024-08-27 ENCOUNTER — Encounter: Payer: Self-pay | Admitting: Family Medicine

## 2024-08-27 VITALS — BP 120/74 | HR 82 | Temp 97.5°F | Ht 66.0 in | Wt 207.0 lb

## 2024-08-27 DIAGNOSIS — M48061 Spinal stenosis, lumbar region without neurogenic claudication: Secondary | ICD-10-CM

## 2024-08-27 DIAGNOSIS — Z0000 Encounter for general adult medical examination without abnormal findings: Secondary | ICD-10-CM | POA: Diagnosis not present

## 2024-08-27 DIAGNOSIS — Z9884 Bariatric surgery status: Secondary | ICD-10-CM

## 2024-08-27 DIAGNOSIS — G4733 Obstructive sleep apnea (adult) (pediatric): Secondary | ICD-10-CM

## 2024-08-27 DIAGNOSIS — E039 Hypothyroidism, unspecified: Secondary | ICD-10-CM | POA: Diagnosis not present

## 2024-08-27 DIAGNOSIS — E782 Mixed hyperlipidemia: Secondary | ICD-10-CM

## 2024-08-27 DIAGNOSIS — G2581 Restless legs syndrome: Secondary | ICD-10-CM

## 2024-08-27 DIAGNOSIS — I10 Essential (primary) hypertension: Secondary | ICD-10-CM | POA: Diagnosis not present

## 2024-08-27 DIAGNOSIS — N1832 Chronic kidney disease, stage 3b: Secondary | ICD-10-CM | POA: Diagnosis not present

## 2024-08-27 DIAGNOSIS — Z85528 Personal history of other malignant neoplasm of kidney: Secondary | ICD-10-CM | POA: Insufficient documentation

## 2024-08-27 DIAGNOSIS — R7303 Prediabetes: Secondary | ICD-10-CM | POA: Diagnosis not present

## 2024-08-27 DIAGNOSIS — E538 Deficiency of other specified B group vitamins: Secondary | ICD-10-CM | POA: Diagnosis not present

## 2024-08-27 DIAGNOSIS — Z0001 Encounter for general adult medical examination with abnormal findings: Secondary | ICD-10-CM

## 2024-08-27 DIAGNOSIS — F339 Major depressive disorder, recurrent, unspecified: Secondary | ICD-10-CM | POA: Diagnosis not present

## 2024-08-27 DIAGNOSIS — M7062 Trochanteric bursitis, left hip: Secondary | ICD-10-CM

## 2024-08-27 LAB — COMPREHENSIVE METABOLIC PANEL WITH GFR
ALT: 11 U/L (ref 0–35)
AST: 18 U/L (ref 0–37)
Albumin: 4.2 g/dL (ref 3.5–5.2)
Alkaline Phosphatase: 91 U/L (ref 39–117)
BUN: 22 mg/dL (ref 6–23)
CO2: 25 meq/L (ref 19–32)
Calcium: 9.2 mg/dL (ref 8.4–10.5)
Chloride: 104 meq/L (ref 96–112)
Creatinine, Ser: 1.53 mg/dL — ABNORMAL HIGH (ref 0.40–1.20)
GFR: 34.06 mL/min — ABNORMAL LOW (ref >60.00)
Glucose, Bld: 93 mg/dL (ref 70–99)
Potassium: 3.4 meq/L — ABNORMAL LOW (ref 3.5–5.1)
Sodium: 141 meq/L (ref 135–145)
Total Bilirubin: 0.7 mg/dL (ref 0.2–1.2)
Total Protein: 6.5 g/dL (ref 6.0–8.3)

## 2024-08-27 LAB — LIPID PANEL
Cholesterol: 134 mg/dL (ref 0–200)
HDL: 50.3 mg/dL (ref >39.00)
LDL Cholesterol: 66 mg/dL (ref 0–99)
NonHDL: 84.18
Total CHOL/HDL Ratio: 3
Triglycerides: 90 mg/dL (ref 0.0–149.0)
VLDL: 18 mg/dL (ref 0.0–40.0)

## 2024-08-27 LAB — CBC WITH DIFFERENTIAL/PLATELET
Basophils Absolute: 0.1 K/uL (ref 0.0–0.1)
Basophils Relative: 1.2 % (ref 0.0–3.0)
Eosinophils Absolute: 0.2 K/uL (ref 0.0–0.7)
Eosinophils Relative: 2.8 % (ref 0.0–5.0)
HCT: 41.7 % (ref 36.0–46.0)
Hemoglobin: 13.7 g/dL (ref 12.0–15.0)
Lymphocytes Relative: 25.3 % (ref 12.0–46.0)
Lymphs Abs: 1.7 K/uL (ref 0.7–4.0)
MCHC: 32.9 g/dL (ref 30.0–36.0)
MCV: 89.1 fl (ref 78.0–100.0)
Monocytes Absolute: 0.4 K/uL (ref 0.1–1.0)
Monocytes Relative: 6.7 % (ref 3.0–12.0)
Neutro Abs: 4.2 K/uL (ref 1.4–7.7)
Neutrophils Relative %: 64 % (ref 43.0–77.0)
Platelets: 264 K/uL (ref 150.0–400.0)
RBC: 4.68 Mil/uL (ref 3.87–5.11)
RDW: 13.4 % (ref 11.5–15.5)
WBC: 6.6 K/uL (ref 4.0–10.5)

## 2024-08-27 LAB — VITAMIN B12: Vitamin B-12: >1500 pg/mL — ABNORMAL HIGH (ref 211–911)

## 2024-08-27 LAB — TSH: TSH: 0.28 u[IU]/mL — ABNORMAL LOW (ref 0.35–5.50)

## 2024-08-27 LAB — HEMOGLOBIN A1C: Hgb A1c MFr Bld: 5.3 % (ref 4.6–6.5)

## 2024-08-27 LAB — VITAMIN D 25 HYDROXY (VIT D DEFICIENCY, FRACTURES): VITD: 42.24 ng/mL (ref 30.00–100.00)

## 2024-08-27 MED ORDER — TRIAMCINOLONE ACETONIDE 40 MG/ML IJ SUSP
20.0000 mg | Freq: Once | INTRAMUSCULAR | Status: AC
  Administered 2024-08-27: 20 mg via INTRA_ARTICULAR

## 2024-08-27 NOTE — Patient Instructions (Addendum)
 Please set up an appointment for a diabetic eye exam and have the results sent to me.  Please return in 6 months for hypertension and weight recheck.  I will release your lab results to you on your MyChart account with further instructions. You may see the results before I do, but when I review them I will send you a message with my report or have my assistant call you if things need to be discussed. Please reply to my message with any questions. Thank you!   If you have any questions or concerns, please don't hesitate to send me a message via MyChart or call the office at [phone]. Thank you for visiting with us  today! It's our pleasure caring for you.

## 2024-08-27 NOTE — Progress Notes (Signed)
 Subjective  Chief Complaint  Patient presents with   Annual Exam    HPI: Veronica Finley is a 73 y.o. female who presents to [facility] [place] today for a Female Wellness Visit. She also has the concerns and/or needs as listed above in the chief complaint. These will be addressed in addition to the Health Maintenance Visit.   Wellness Visit: annual visit with health maintenance review and exam  HM: mammo due next month. Other screens are up to date. She's doing well overall. Going to [place] this weekend.  Chronic disease f/u and/or acute problem visit: (deemed necessary to be done in addition to the wellness visit): Discussed the use of AI scribe software for clinical note transcription with the patient, who gave verbal consent to proceed.  History of Present Illness Veronica Finley is a 73 year old female who presents for follow-up and management of weight loss and joint pain.  Obesity: doing well on glp1  Arthralgia and bursitis - Joint pain, left hip is active again. H/o gr troch bursitis - History of bursitis in the hand - Previous corticosteroid injection provided pain relief - Prior use of prednisone  with temporary improvement - Concerned about increased pain with upcoming travel involving walking - Currently performing stretching exercises and considering icing the affected area  HTN: improved since lessening dose of bp meds: was getting lightheaded.   Restless legs syndrome - History of restless legs - stable on ropinirole   Nutritional supplementation and bariatric history low d and b12 due for recheck - Daily vitamin D  and B12 supplementation - No iron supplementation - History of lap band placement  Hypothyroidism due for recheck. Feels fine.  Compliant with meds  HLD due for recheck.   H/o prediabetes but now eating better on zepbound   Back pain doing well. Mood is stable.  Monitoring kidney function  Preventive health  maintenance - Scheduled for a mammogram soon   Assessment  1. Encounter for well adult exam with abnormal findings   2. Acquired hypothyroidism   3. Chronic kidney disease, stage 3b (HCC)   4. Essential hypertension   5. Hx of laparoscopic gastric banding+ truncal vagotomy 03/24/2006   6. Major depression, recurrent, chronic   7. Mixed hyperlipidemia   8. Morbid obesity (HCC)   9. OSA on CPAP   10. Prediabetes   11. History of renal cell carcinoma   12. Spinal stenosis of lumbar region without neurogenic claudication   13. Vitamin B12 deficiency   14. Restless leg syndrome      Plan  Female Wellness Visit: Age appropriate Health Maintenance and Prevention measures were discussed with patient. Included topics are cancer screening recommendations, ways to keep healthy (see AVS) including dietary and exercise recommendations, regular eye and dental care, use of seat belts, and avoidance of moderate alcohol use and tobacco use.  BMI: discussed patient's BMI and encouraged positive lifestyle modifications to help get to or maintain a target BMI. HM needs and immunizations were addressed and ordered. See below for orders. See HM and immunization section for updates. Routine labs and screening tests ordered including cmp, cbc and lipids where appropriate. Discussed recommendations regarding Vit D and calcium  supplementation (see AVS)  Chronic disease management visit and/or acute problem visit: Assessment and Plan Assessment & Plan Adult Wellness Visit Routine adult wellness visit with well-controlled blood pressure and no significant dizziness. - Check thyroid , cholesterol, iron, B12, and vitamin D  levels - Ensure mammogram is scheduled  Morbid  obesity due to excess calories, post-bariatric surgery Significant weight loss with clothes now too big. On zepbound  - Encourage strength training exercises - Ensure adequate protein intake  hip bursitis Recurrent hip bursitis with previous  relief from prednisone  and injection. Some improvement with prednisone  but symptoms have returned. - Administer hip injection - Advise stretching and icing of shoulder  Essential hypertension Blood pressure is well-controlled.  Hypothyroidism Thyroid  function to be checked as part of routine labs. - Check thyroid  function  Mixed hyperlipidemia Cholesterol levels to be checked as part of routine labs. - Check cholesterol levels  Deficiency of B group vitamins Currently taking vitamin D  and B12 supplements. Dosage to be adjusted if vitamin D  levels are not optimal. - Check B12 and vitamin D  levels - Adjust vitamin D  dosage if necessary  Restless legs syndrome Iron levels to be checked due to history of lap band surgery. - Check iron levels   Follow up: 6 mo for recheck  Orders Placed This Encounter  Procedures   TSH   VITAMIN D  25 Hydroxy (Vit-D Deficiency, Fractures)   CBC with Differential/Platelet   Comprehensive metabolic panel with GFR   Lipid panel   Vitamin B12   Hemoglobin A1c   Iron, TIBC and Ferritin Panel   No orders of the defined types were placed in this encounter.     Body mass index is 33.41 kg/m. Wt Readings from Last 3 Encounters:  08/27/24 207 lb (93.9 kg)  08/09/24 209 lb 6.4 oz (95 kg)  06/03/24 227 lb 3.2 oz (103.1 kg)     Patient Active Problem List   Diagnosis Date Noted   Prediabetes 01/21/2023    Priority: High   Morbid obesity (HCC) 07/17/2022    Priority: High   Chronic kidney disease, stage 3b (HCC) 06/28/2021    Priority: High    GFR 41 02/2024: see urology records. Referred to nephrology for monitoring s/p left nephrectomy.     Renal cell carcinoma (HCC) - s/p left nephrectomy 12/14/2020    Priority: High   Status post nephrectomy 10/24/2020    Priority: High   OSA on CPAP 10/26/2018    Priority: High   Hx of laparoscopic gastric banding+ truncal vagotomy 03/24/2006 01/10/2014    Priority: High   Spinal stenosis of lumbar  region 07/08/2013    Priority: High   Major depression, recurrent, chronic 07/08/2013    Priority: High   Essential hypertension 04/06/2012    Priority: High   Acquired hypothyroidism 10/08/2011    Priority: High   Mixed hyperlipidemia 10/08/2011    Priority: High   S/P knee replacement 04/02/2019    Priority: Medium    Restless leg syndrome 12/20/2010    Priority: Medium    Osteoarthritis of multiple joints 12/17/2010    Priority: Medium    Osteoporosis screening 01/08/2024    Priority: Low    Dexa 2025: NORMAL. Can consider repeat in 5 years.     Status post total replacement of right hip 05/11/2021    Priority: Low   Status post total replacement of left hip 02/09/2021    Priority: Low   Vitamin B12 deficiency 06/02/2020    Priority: Low   Contact dermatitis due to chemicals 01/25/2019    Priority: Low   Generalized hyperhidrosis 07/15/2016    Priority: Low   AR (allergic rhinitis) 12/20/2010    Priority: Low   History of renal cell carcinoma 08/27/2024   Health Maintenance  Topic Date Due   Mammogram  09/30/2024  COVID-19 Vaccine (5 - 2025-26 season) 08/27/2025 (Originally 07/19/2024)   Medicare Annual Wellness (AWV)  02/02/2025   Colonoscopy  03/03/2025   DTaP/Tdap/Td (3 - Td or Tdap) 07/21/2028   DEXA SCAN  11/26/2028   Pneumococcal Vaccine: 50+ Years  Completed   Influenza Vaccine  Completed   Hepatitis C Screening  Completed   Zoster Vaccines- Shingrix   Completed   Meningococcal B Vaccine  Aged Out   Immunization History  Administered Date(s) Administered   Fluad Quad(high Dose 65+) 09/23/2020, 01/21/2023   Fluad Trivalent(High Dose 65+) 08/06/2023   INFLUENZA, HIGH DOSE SEASONAL PF 07/21/2018, 08/09/2024   Influenza Inj Mdck Quad Pf 12/15/2014, 01/15/2016   Influenza, Seasonal, Injecte, Preservative Fre 11/19/2007   Influenza,inj,Quad PF,6+ Mos 10/20/2017   Influenza-Unspecified 10/08/2011   Moderna Covid-19 Vaccine Bivalent Booster 17yrs & up  08/06/2023   PFIZER(Purple Top)SARS-COV-2 Vaccination 01/27/2020, 02/17/2020, 12/15/2020   PPD Test 11/19/2007   Pneumococcal Conjugate-13 05/17/2020   Pneumococcal Polysaccharide-23 07/17/2022   Tdap 11/19/2007, 07/21/2018   Zoster Recombinant(Shingrix ) 12/19/2020, 02/16/2022   Zoster, Live 07/11/2014   We updated and reviewed the patient's past history in detail and it is documented below. Allergies: Patient is allergic to thimerosal and thimerosal (thiomersal). Past Medical History Patient  has a past medical history of Anemia, Arthritis, Cancer (HCC), Cataract (2023), COVID-19 (08/28/2020), Depression, Family history of adverse reaction to anesthesia, Heart murmur, Hyperlipidemia, Hypertension, Hypothyroidism, Lumbar spinal stenosis (07/08/2013), OSA on CPAP (10/26/2018), Pre-diabetes, Renal mass (09/2020), and Sleep apnea. Past Surgical History Patient  has a past surgical history that includes Knee arthroscopy; Tubal ligation; Gastric bypass; Spine surgery; Uvulectomy; Total knee arthroplasty (Right, 04/02/2019); Tonsillectomy; Total knee arthroplasty (Left, 08/20/2019); Robotic assited partial nephrectomy (Left, 09/22/2020); Ventral hernia repair (N/A, 11/23/2020); Total hip arthroplasty (Left, 02/09/2021); Total hip arthroplasty (Right, 05/11/2021); Hernia repair (2022); and Joint replacement. Family History: Patient family history includes Arthritis in her brother, mother, sister, and sister; Atrial fibrillation in her mother; Cancer in her sister; Diabetes in her brother and mother; Heart disease in her father; Hyperlipidemia in her brother and mother; Hypertension in her father and mother; Leukemia in her father; Varicose Veins in her brother and mother. Social History:  Patient  reports that she has never smoked. She has never used smokeless tobacco. She reports current alcohol use. She reports that she does not use drugs.  Review of Systems: Constitutional: negative for fever or  malaise Ophthalmic: negative for photophobia, double vision or loss of vision Cardiovascular: negative for chest pain, dyspnea on exertion, or new LE swelling Respiratory: negative for SOB or persistent cough Gastrointestinal: negative for abdominal pain, change in bowel habits or melena Genitourinary: negative for dysuria or gross hematuria, no abnormal uterine bleeding or disharge Musculoskeletal: negative for new gait disturbance or muscular weakness Integumentary: negative for new or persistent rashes, no breast lumps Neurological: negative for TIA or stroke symptoms Psychiatric: negative for SI or delusions Allergic/Immunologic: negative for hives  Patient Care Team    Relationship Specialty Notifications Start End  Jodie Lavern CROME, MD PCP - General Family Medicine  10/16/17   Gerome Charleston, MD (Inactive) Consulting Physician Orthopedic Surgery  02/22/20   Devere Lonni Righter, MD Consulting Physician Urology  03/29/24   Norine Manuelita LABOR, MD Consulting Physician Nephrology  03/29/24     Objective  Vitals: BP 120/74 (BP Location: Left Arm, Patient Position: Sitting, Cuff Size: Large)   Pulse 82   Temp (!) 97.5 F (36.4 C) (Temporal)   Ht 5' 6 (1.676 m)  Wt 207 lb (93.9 kg)   SpO2 96%   BMI 33.41 kg/m  General:  Well developed, well nourished, no acute distress  Psych:  Alert and orientedx3,normal mood and affect HEENT:  Normocephalic, atraumatic, non-icteric sclera,  supple neck without adenopathy, mass or thyromegaly Cardiovascular:  Normal S1, S2, RRR without gallop, rub or murmur Respiratory:  Good breath sounds bilaterally, CTAB with normal respiratory effort Gastrointestinal: normal bowel sounds, soft, non-tender, no noted masses. No HSM MSK: extremities without edema, joints without erythema or swelling Neurologic:    Mental status is normal.  Gross motor and sensory exams are normal.  No tremor Left hip: ttp over bursa, nl gait   GR Trochanteric Bursa steroid  injection  Procedure Note   Pre-operative Diagnosis: left hip bursitis   Post-operative Diagnosis: same   Indications: pain   Anesthesia: cold spray   Procedure Details    Verbal consent was obtained for the procedure. Universal time out done. The point of maximum tenderness was identified and marked over the hip bursa. The skin prepped with alcohol and cold spray used for anesthesia. A needle was advanced into the bursa and the steroid/lido (20mg  kenalog : 1.66ml lidocaine  w/o epi) was administered easily.    Complications:  None; patient tolerated the procedure well.  Commons side effects, risks, benefits, and alternatives for medications and treatment plan prescribed today were discussed, and the patient expressed understanding of the given instructions. Patient is instructed to call or message via MyChart if he/she has any questions or concerns regarding our treatment plan. No barriers to understanding were identified. We discussed Red Flag symptoms and signs in detail. Patient expressed understanding regarding what to do in case of urgent or emergency type symptoms.  Medication list was reconciled, printed and provided to the patient in AVS. Patient instructions and summary information was reviewed with the patient as documented in the AVS. This note was prepared with assistance of Dragon voice recognition software. Occasional wrong-word or sound-a-like substitutions may have occurred due to the inherent limitations of voice recognition software

## 2024-08-28 LAB — IRON,TIBC AND FERRITIN PANEL
%SAT: 33 % (ref 16–45)
Ferritin: 35 ng/mL (ref 16–288)
Iron: 97 ug/dL (ref 45–160)
TIBC: 290 ug/dL (ref 250–450)

## 2024-08-29 ENCOUNTER — Encounter: Payer: Self-pay | Admitting: Family Medicine

## 2024-09-03 ENCOUNTER — Other Ambulatory Visit: Payer: Self-pay

## 2024-09-03 ENCOUNTER — Ambulatory Visit: Payer: Self-pay | Admitting: Family Medicine

## 2024-09-03 DIAGNOSIS — E039 Hypothyroidism, unspecified: Secondary | ICD-10-CM

## 2024-09-03 MED ORDER — LEVOTHYROXINE SODIUM 125 MCG PO TABS: 125.0000 ug | ORAL_TABLET | Freq: Every day | ORAL | 3 refills | Status: AC

## 2024-09-03 MED ORDER — LEVOTHYROXINE SODIUM 125 MCG PO TABS: 125.0000 ug | ORAL_TABLET | Freq: Every day | ORAL | 3 refills | Status: DC

## 2024-09-03 NOTE — Progress Notes (Signed)
 Please call patient:Overall, lab results look good, however thyroid  testing is now off.  It is time to reduce the dose again.  Please stop 150 mcg daily, please order 125 mcg daily.  We will need to recheck these levels again in 8 weeks.  Please schedule an office visit.  Potassium is just below normal, eat foods rich in potassium.  This is because of the diuretic. As well, kidney function is slightly worse.  Kidney doctor will monitor this.  Other lab tests including vitamin levels all look fine.

## 2024-09-09 DIAGNOSIS — I10 Essential (primary) hypertension: Secondary | ICD-10-CM | POA: Diagnosis not present

## 2024-09-09 DIAGNOSIS — G4733 Obstructive sleep apnea (adult) (pediatric): Secondary | ICD-10-CM | POA: Diagnosis not present

## 2024-11-02 ENCOUNTER — Encounter: Payer: Self-pay | Admitting: Family Medicine

## 2024-11-02 ENCOUNTER — Ambulatory Visit: Admitting: Family Medicine

## 2024-11-02 ENCOUNTER — Ambulatory Visit: Payer: Self-pay | Admitting: Family Medicine

## 2024-11-02 VITALS — BP 128/70 | HR 82 | Temp 97.7°F | Ht 66.0 in | Wt 202.8 lb

## 2024-11-02 DIAGNOSIS — T502X5A Adverse effect of carbonic-anhydrase inhibitors, benzothiadiazides and other diuretics, initial encounter: Secondary | ICD-10-CM

## 2024-11-02 DIAGNOSIS — N1832 Chronic kidney disease, stage 3b: Secondary | ICD-10-CM | POA: Diagnosis not present

## 2024-11-02 DIAGNOSIS — E876 Hypokalemia: Secondary | ICD-10-CM | POA: Diagnosis not present

## 2024-11-02 DIAGNOSIS — E039 Hypothyroidism, unspecified: Secondary | ICD-10-CM

## 2024-11-02 LAB — RENAL FUNCTION PANEL
Albumin: 4.1 g/dL (ref 3.5–5.2)
BUN: 17 mg/dL (ref 6–23)
CO2: 25 meq/L (ref 19–32)
Calcium: 9.6 mg/dL (ref 8.4–10.5)
Chloride: 106 meq/L (ref 96–112)
Creatinine, Ser: 1.48 mg/dL — ABNORMAL HIGH (ref 0.40–1.20)
GFR: 35.4 mL/min — ABNORMAL LOW (ref >60.00)
Glucose, Bld: 90 mg/dL (ref 70–99)
Phosphorus: 4 mg/dL (ref 2.3–4.6)
Potassium: 3.6 meq/L (ref 3.5–5.1)
Sodium: 141 meq/L (ref 135–145)

## 2024-11-02 LAB — TSH: TSH: 2.44 u[IU]/mL (ref 0.35–5.50)

## 2024-11-02 LAB — T4, FREE: Free T4: 0.88 ng/dL (ref 0.60–1.60)

## 2024-11-02 LAB — T3, FREE: T3, Free: 2.3 pg/mL (ref 2.3–4.2)
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
## 2024-11-02 NOTE — Progress Notes (Signed)
 See mychart note

## 2025-02-07 ENCOUNTER — Ambulatory Visit

## 2025-02-25 ENCOUNTER — Ambulatory Visit: Admitting: Family Medicine

## 2025-08-29 ENCOUNTER — Encounter: Admitting: Family Medicine
